# Patient Record
Sex: Female | Born: 1935 | Race: White | Hispanic: No | Smoking: Never smoker
Health system: Southern US, Community
[De-identification: ages and names within clinical notes are randomized; demographics above are authoritative.]

## PROBLEM LIST (undated history)

## (undated) DIAGNOSIS — G4700 Insomnia, unspecified: Secondary | ICD-10-CM

## (undated) DIAGNOSIS — I10 Essential (primary) hypertension: Secondary | ICD-10-CM

## (undated) DIAGNOSIS — M810 Age-related osteoporosis without current pathological fracture: Secondary | ICD-10-CM

## (undated) DIAGNOSIS — K219 Gastro-esophageal reflux disease without esophagitis: Secondary | ICD-10-CM

## (undated) DIAGNOSIS — R079 Chest pain, unspecified: Secondary | ICD-10-CM

## (undated) DIAGNOSIS — I6523 Occlusion and stenosis of bilateral carotid arteries: Secondary | ICD-10-CM

## (undated) HISTORY — PX: ABDOMINAL HYSTERECTOMY: SHX81

## (undated) HISTORY — DX: Essential (primary) hypertension: I10

## (undated) HISTORY — DX: Occlusion and stenosis of bilateral carotid arteries: I65.23

## (undated) HISTORY — DX: Gastro-esophageal reflux disease without esophagitis: K21.9

## (undated) HISTORY — DX: Insomnia, unspecified: G47.00

## (undated) HISTORY — DX: Chest pain, unspecified: R07.9

## (undated) HISTORY — DX: Age-related osteoporosis without current pathological fracture: M81.0

---

## 2004-07-11 ENCOUNTER — Ambulatory Visit: Payer: Self-pay | Admitting: Internal Medicine

## 2005-03-27 ENCOUNTER — Ambulatory Visit: Payer: Self-pay | Admitting: Internal Medicine

## 2005-10-09 ENCOUNTER — Ambulatory Visit (HOSPITAL_COMMUNITY): Admission: RE | Admit: 2005-10-09 | Discharge: 2005-10-09 | Payer: Self-pay | Admitting: *Deleted

## 2005-10-15 ENCOUNTER — Ambulatory Visit: Payer: Self-pay | Admitting: *Deleted

## 2006-04-17 ENCOUNTER — Ambulatory Visit: Payer: Self-pay | Admitting: Internal Medicine

## 2006-12-25 ENCOUNTER — Ambulatory Visit: Payer: Self-pay | Admitting: Gastroenterology

## 2008-05-16 ENCOUNTER — Ambulatory Visit: Payer: Self-pay | Admitting: Internal Medicine

## 2010-01-22 ENCOUNTER — Encounter
Admission: RE | Admit: 2010-01-22 | Discharge: 2010-01-22 | Payer: Self-pay | Source: Home / Self Care | Attending: Family Medicine | Admitting: Family Medicine

## 2010-02-14 ENCOUNTER — Ambulatory Visit: Payer: Self-pay | Admitting: Family Medicine

## 2011-06-25 ENCOUNTER — Other Ambulatory Visit: Payer: Self-pay | Admitting: Family Medicine

## 2011-06-25 DIAGNOSIS — R079 Chest pain, unspecified: Secondary | ICD-10-CM

## 2011-06-28 ENCOUNTER — Other Ambulatory Visit: Payer: Self-pay | Admitting: Family Medicine

## 2011-07-02 ENCOUNTER — Ambulatory Visit
Admission: RE | Admit: 2011-07-02 | Discharge: 2011-07-02 | Disposition: A | Discharge status: Home / Self Care | Payer: Medicare Other | Source: Ambulatory Visit | Attending: Family Medicine | Admitting: Family Medicine

## 2011-07-02 MED ORDER — IOHEXOL 300 MG/ML  SOLN
75.0000 mL | Freq: Once | INTRAMUSCULAR | Status: AC | PRN
  Administered 2011-07-02: 75 mL via INTRAVENOUS

## 2011-07-03 ENCOUNTER — Other Ambulatory Visit (HOSPITAL_COMMUNITY): Payer: Self-pay

## 2011-07-03 ENCOUNTER — Encounter (HOSPITAL_COMMUNITY): Payer: Self-pay

## 2011-07-23 ENCOUNTER — Other Ambulatory Visit: Payer: Self-pay | Admitting: Family Medicine

## 2011-07-23 DIAGNOSIS — R9389 Abnormal findings on diagnostic imaging of other specified body structures: Secondary | ICD-10-CM

## 2011-07-29 ENCOUNTER — Other Ambulatory Visit: Payer: Medicare Other

## 2011-08-14 ENCOUNTER — Ambulatory Visit
Admission: RE | Admit: 2011-08-14 | Discharge: 2011-08-14 | Disposition: A | Discharge status: Home / Self Care | Payer: Medicare Other | Source: Ambulatory Visit | Attending: Family Medicine | Admitting: Family Medicine

## 2011-08-14 DIAGNOSIS — R9389 Abnormal findings on diagnostic imaging of other specified body structures: Secondary | ICD-10-CM

## 2011-09-02 ENCOUNTER — Other Ambulatory Visit: Payer: Self-pay | Admitting: Family Medicine

## 2011-09-02 DIAGNOSIS — E041 Nontoxic single thyroid nodule: Secondary | ICD-10-CM

## 2011-09-10 ENCOUNTER — Other Ambulatory Visit (HOSPITAL_COMMUNITY)
Admission: RE | Admit: 2011-09-10 | Discharge: 2011-09-10 | Disposition: A | Discharge status: Home / Self Care | Payer: Medicare Other | Source: Ambulatory Visit | Attending: Interventional Radiology | Admitting: Interventional Radiology

## 2011-09-10 ENCOUNTER — Ambulatory Visit
Admission: RE | Admit: 2011-09-10 | Discharge: 2011-09-10 | Disposition: A | Discharge status: Home / Self Care | Payer: Medicare Other | Source: Ambulatory Visit | Attending: Family Medicine | Admitting: Family Medicine

## 2011-09-10 DIAGNOSIS — E049 Nontoxic goiter, unspecified: Secondary | ICD-10-CM | POA: Insufficient documentation

## 2011-09-10 DIAGNOSIS — E041 Nontoxic single thyroid nodule: Secondary | ICD-10-CM

## 2011-09-30 ENCOUNTER — Ambulatory Visit: Payer: Self-pay | Admitting: Family Medicine

## 2012-04-16 ENCOUNTER — Encounter: Payer: Self-pay | Admitting: *Deleted

## 2012-04-16 DIAGNOSIS — R0989 Other specified symptoms and signs involving the circulatory and respiratory systems: Secondary | ICD-10-CM | POA: Insufficient documentation

## 2012-04-16 DIAGNOSIS — K219 Gastro-esophageal reflux disease without esophagitis: Secondary | ICD-10-CM | POA: Insufficient documentation

## 2012-04-16 DIAGNOSIS — I10 Essential (primary) hypertension: Secondary | ICD-10-CM | POA: Insufficient documentation

## 2012-04-21 ENCOUNTER — Ambulatory Visit (INDEPENDENT_AMBULATORY_CARE_PROVIDER_SITE_OTHER): Payer: BC Managed Care – PPO | Admitting: Family Medicine

## 2012-04-21 ENCOUNTER — Encounter: Payer: Self-pay | Admitting: Family Medicine

## 2012-04-21 VITALS — BP 190/86 | HR 60 | Temp 98.0°F | Resp 16 | Ht 63.0 in | Wt 127.0 lb

## 2012-04-21 DIAGNOSIS — I10 Essential (primary) hypertension: Secondary | ICD-10-CM

## 2012-04-21 DIAGNOSIS — R0609 Other forms of dyspnea: Secondary | ICD-10-CM

## 2012-04-21 DIAGNOSIS — R0989 Other specified symptoms and signs involving the circulatory and respiratory systems: Secondary | ICD-10-CM

## 2012-04-21 NOTE — Patient Instructions (Addendum)
Quit diltiazem. Increase clonidine to 0.1 mg three times a day.  I am going to schedule a cardiology consult. Please bring me blood pressure readings in 1 month.
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
## 2012-04-27 ENCOUNTER — Encounter: Payer: Self-pay | Admitting: Family Medicine

## 2012-04-27 ENCOUNTER — Ambulatory Visit (INDEPENDENT_AMBULATORY_CARE_PROVIDER_SITE_OTHER): Payer: BC Managed Care – PPO | Admitting: Family Medicine

## 2012-04-27 VITALS — BP 220/90 | HR 80 | Temp 98.1°F | Resp 14

## 2012-04-27 DIAGNOSIS — I10 Essential (primary) hypertension: Secondary | ICD-10-CM
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
## 2012-04-29 ENCOUNTER — Ambulatory Visit (INDEPENDENT_AMBULATORY_CARE_PROVIDER_SITE_OTHER): Payer: BC Managed Care – PPO | Admitting: Family Medicine

## 2012-04-29 ENCOUNTER — Encounter: Payer: Self-pay | Admitting: Family Medicine

## 2012-04-29 VITALS — BP 170/70 | HR 62 | Temp 97.7°F | Resp 14 | Wt 129.0 lb

## 2012-04-29 DIAGNOSIS — I10 Essential (primary) hypertension: Secondary | ICD-10-CM

## 2012-04-29 MED ORDER — SPIRONOLACTONE 25 MG PO TABS: 25.0000 mg | ORAL_TABLET | Freq: Every day | ORAL | Status: DC

## 2012-04-29 MED ORDER — CLONIDINE HCL 0.3 MG/24HR TD PTWK: 1.0000 | MEDICATED_PATCH | TRANSDERMAL | Status: DC
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
## 2012-04-30 ENCOUNTER — Telehealth: Payer: Self-pay | Admitting: Family Medicine

## 2012-04-30 NOTE — Telephone Encounter (Signed)
Two medications refilled yesterday sent to wrong pharmacy. Clonidine Patch 0.3mg /24hr qwk #4 2 refills Aldactone 25mg  one po qd  #30 2 refills Pt request they be sent ot [facility] on [address] in [place].  (pharmacy called, verbl order given) Pharmacy preference changed.

## 2012-05-03 LAB — ALDOSTERONE + RENIN ACTIVITY W/ RATIO: Aldosterone: 2 ng/dL

## 2012-05-07 ENCOUNTER — Telehealth: Payer: Self-pay | Admitting: Family Medicine

## 2012-05-07 ENCOUNTER — Ambulatory Visit (INDEPENDENT_AMBULATORY_CARE_PROVIDER_SITE_OTHER): Payer: BC Managed Care – PPO | Admitting: Family Medicine

## 2012-05-07 VITALS — BP 142/80

## 2012-05-07 DIAGNOSIS — I10 Essential (primary) hypertension: Secondary | ICD-10-CM

## 2012-05-07 MED ORDER — CLONAZEPAM 0.5 MG PO TABS: 0.5000 mg | ORAL_TABLET | Freq: Three times a day (TID) | ORAL | Status: DC | PRN

## 2012-05-07 NOTE — Patient Instructions (Signed)
Bp checked by nurse.  Pt concerned about BP because has cataract surgery pending for next month.  Appt made to F/U  With Dr Tanya Nones on 4/17.  Told can stop by office for BP check at any time.

## 2012-05-07 NOTE — Telephone Encounter (Signed)
Ok to refill 60.  How are her BP's?

## 2012-05-07 NOTE — Telephone Encounter (Signed)
?  ok to reill

## 2012-05-07 NOTE — Telephone Encounter (Signed)
Med c/o BP's in chart are much improved

## 2012-05-08 ENCOUNTER — Encounter: Payer: Self-pay | Admitting: *Deleted

## 2012-05-12 ENCOUNTER — Ambulatory Visit (INDEPENDENT_AMBULATORY_CARE_PROVIDER_SITE_OTHER): Payer: Medicare Other | Admitting: Cardiovascular Disease

## 2012-05-12 ENCOUNTER — Encounter: Payer: Self-pay | Admitting: Cardiovascular Disease

## 2012-05-12 VITALS — BP 172/70 | HR 63 | Ht 63.0 in | Wt 124.1 lb

## 2012-05-12 DIAGNOSIS — I10 Essential (primary) hypertension: Secondary | ICD-10-CM

## 2012-05-12 DIAGNOSIS — R0600 Dyspnea, unspecified: Secondary | ICD-10-CM | POA: Insufficient documentation

## 2012-05-12 DIAGNOSIS — R0989 Other specified symptoms and signs involving the circulatory and respiratory systems: Secondary | ICD-10-CM

## 2012-05-12 DIAGNOSIS — R0609 Other forms of dyspnea: Secondary | ICD-10-CM
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
## 2012-05-12 NOTE — Assessment & Plan Note (Signed)
Labile not clear clonidine will work for her  F/U Dr Tanya Nones Low sodium diet

## 2012-05-12 NOTE — Assessment & Plan Note (Signed)
Exertonal dyspnea with normal cardiovascular exam  F/U stress echo

## 2012-05-12 NOTE — Patient Instructions (Signed)
Your physician recommends that you schedule a follow-up appointment in:   AS NEEDED   Your physician recommends that you continue on your current medications as directed. Please refer to the Current Medication list given to you today.   Your physician has requested that you have a stress echocardiogram. For further information please visit [email or web address]. Please follow instruction sheet as given.  

## 2012-05-21 ENCOUNTER — Encounter: Payer: Self-pay | Admitting: Family Medicine

## 2012-05-21 ENCOUNTER — Ambulatory Visit (INDEPENDENT_AMBULATORY_CARE_PROVIDER_SITE_OTHER): Payer: BC Managed Care – PPO | Admitting: Family Medicine

## 2012-05-21 VITALS — BP 200/90 | HR 74 | Temp 98.0°F | Resp 16 | Wt 128.0 lb

## 2012-05-21 DIAGNOSIS — IMO0001 Reserved for inherently not codable concepts without codable children: Secondary | ICD-10-CM

## 2012-05-21 DIAGNOSIS — I10 Essential (primary) hypertension: Secondary | ICD-10-CM
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
## 2012-05-27 ENCOUNTER — Ambulatory Visit (HOSPITAL_COMMUNITY): Payer: Medicare Other | Attending: Cardiovascular Disease

## 2012-05-27 ENCOUNTER — Encounter: Payer: Self-pay | Admitting: Family Medicine

## 2012-05-27 ENCOUNTER — Encounter: Payer: Self-pay | Admitting: Cardiovascular Disease

## 2012-05-27 ENCOUNTER — Ambulatory Visit (HOSPITAL_COMMUNITY): Payer: Medicare Other | Attending: Cardiovascular Disease | Admitting: Radiology

## 2012-05-27 DIAGNOSIS — R0609 Other forms of dyspnea: Secondary | ICD-10-CM | POA: Insufficient documentation

## 2012-05-27 DIAGNOSIS — R0989 Other specified symptoms and signs involving the circulatory and respiratory systems: Secondary | ICD-10-CM | POA: Insufficient documentation

## 2012-05-27 DIAGNOSIS — R072 Precordial pain: Secondary | ICD-10-CM

## 2012-05-27 DIAGNOSIS — R0600 Dyspnea, unspecified: Secondary | ICD-10-CM

## 2012-05-27 DIAGNOSIS — I10 Essential (primary) hypertension: Secondary | ICD-10-CM | POA: Insufficient documentation

## 2012-05-27 DIAGNOSIS — R079 Chest pain, unspecified: Secondary | ICD-10-CM | POA: Insufficient documentation

## 2012-05-27 NOTE — Progress Notes (Signed)
Stress Echocardiogram performed.  

## 2012-05-28 ENCOUNTER — Encounter: Payer: Self-pay | Admitting: Family Medicine

## 2012-06-12 ENCOUNTER — Encounter: Payer: Self-pay | Admitting: Family Medicine

## 2012-06-16 ENCOUNTER — Encounter: Payer: Self-pay | Admitting: Family Medicine

## 2012-06-16 ENCOUNTER — Ambulatory Visit (INDEPENDENT_AMBULATORY_CARE_PROVIDER_SITE_OTHER): Payer: BC Managed Care – PPO | Admitting: Family Medicine

## 2012-06-16 VITALS — BP 160/70 | HR 58 | Temp 98.2°F | Resp 16 | Wt 127.0 lb

## 2012-06-16 DIAGNOSIS — R001 Bradycardia, unspecified: Secondary | ICD-10-CM

## 2012-06-16 DIAGNOSIS — IMO0001 Reserved for inherently not codable concepts without codable children: Secondary | ICD-10-CM

## 2012-06-16 DIAGNOSIS — I10 Essential (primary) hypertension: Secondary | ICD-10-CM

## 2012-06-16 DIAGNOSIS — I498 Other specified cardiac arrhythmias: Secondary | ICD-10-CM

## 2012-06-16 NOTE — Progress Notes (Signed)
Subjective:    Patient ID: Brittany Levy, female    DOB: [birth year], 78 y.o.   MRN: [id number]  HPI Patient is here for followup of her hypertension. She is currently on a clonidine patch 0.3 mg per 24 hours, Cardizem CD 180 mg by mouth daily, hydralazine 25 mg by mouth twice a day, hydrochlorothiazide 25 mg by mouth daily, Avapro 300 mg by mouth daily, bystolic 10 mg by mouth daily. She was taking Spironolactone 25 mg by mouth daily, however I discontinued this when she was experiencing hypotension with blood pressures of 80-100 over 40s. Her blood pressure is better at home today is 111/47. She has a history of significant white coat hypertension. For instance, her blood pressure in the office today is 160/70. This is erroneous. Her blood pressure at home is much better.  However she continues to have bradycardia with pulses of 40s to 50s.  Her stress echocardiogram was essentially normal although she was not able to reach a target heart rate due to her bradycardia. Her bradycardia is caused in part due to bystolic as well as diltiazem.   Past Medical History  Diagnosis Date  . Hypertension   . GERD (gastroesophageal reflux disease)   . Chest pain    Current Outpatient Prescriptions on File Prior to Visit  Medication Sig Dispense Refill  . calcium-vitamin D (OSCAL WITH D) 250-125 MG-UNIT per tablet Take 1 tablet by mouth 2 (two) times daily.      . clonazePAM (KLONOPIN) 0.5 MG tablet Take 1 tablet (0.5 mg total) by mouth every 8 (eight) hours as needed for anxiety.  60 tablet  0  . cloNIDine (CATAPRES - DOSED IN MG/24 HR) 0.3 mg/24hr Place 1 patch (0.3 mg total) onto the skin once a week.  4 patch  2  . diltiazem (CARDIZEM CD) 180 MG 24 hr capsule Take 180 mg by mouth at bedtime.      . hydrALAZINE (APRESOLINE) 25 MG tablet Take 25 mg by mouth 2 (two) times daily.      . hydrochlorothiazide (HYDRODIURIL) 25 MG tablet Take 50 mg by mouth daily.      . irbesartan (AVAPRO) 300 MG tablet Take  300 mg by mouth daily.       . nebivolol (BYSTOLIC) 10 MG tablet Take 10 mg by mouth daily.      Marland Kitchen omeprazole (PRILOSEC) 40 MG capsule Take 40 mg by mouth daily.      Marland Kitchen KLOR-CON 10 10 MEQ tablet       . spironolactone (ALDACTONE) 25 MG tablet Take 1 tablet (25 mg total) by mouth daily.  30 tablet  2   No current facility-administered medications on file prior to visit.   No Known Allergies History   Social History  . Marital Status: Married    Spouse Name: N/A    Number of Children: N/A  . Years of Education: N/A   Occupational History  . Not on file.   Social History Main Topics  . Smoking status: Never Smoker   . Smokeless tobacco: Not on file  . Alcohol Use: Yes     Comment: wine occasionally  . Drug Use: No  . Sexually Active: Not on file   Other Topics Concern  . Not on file   Social History Narrative  . No narrative on file      Review of Systems  All other systems reviewed and are negative.       Objective:   Physical Exam  Cardiovascular: Normal rate, regular rhythm and normal heart sounds.  Exam reveals no gallop.   No murmur heard. Pulmonary/Chest: Effort normal and breath sounds normal. No respiratory distress. She has no wheezes. She has no rales.  Abdominal: Soft. Bowel sounds are normal. She exhibits no distension. There is no tenderness. There is no rebound.          Assessment & Plan:  1. White coat hypertension Office blood pressures should always be viewed as suspect.  Her heart pressure is well controlled 100-111 over 40s to 50s.    2. HTN (hypertension) Due to hypotension, discontinue spironolactone altogether and discontinue diltiazem.  Monitor Home blood pressures. Goal blood pressure is less than 140/90.  Because she is stopping spironolactone and, I have asked the patient to resume her Klor-Con 10 mEq by mouth daily.  3. Bradycardia Discontinue diltiazem.

## 2012-07-12 ENCOUNTER — Encounter: Payer: Self-pay | Admitting: Family Medicine

## 2012-07-13 ENCOUNTER — Other Ambulatory Visit: Payer: Self-pay | Admitting: Family Medicine

## 2012-07-13 MED ORDER — CLONIDINE HCL 0.3 MG/24HR TD PTWK: 1.0000 | MEDICATED_PATCH | TRANSDERMAL | Status: DC

## 2012-07-13 NOTE — Telephone Encounter (Signed)
Rx Refilled  

## 2012-08-19 ENCOUNTER — Encounter: Payer: Self-pay | Admitting: Family Medicine

## 2012-08-20 ENCOUNTER — Other Ambulatory Visit: Payer: Self-pay | Admitting: Family Medicine

## 2012-08-20 MED ORDER — CLONAZEPAM 0.5 MG PO TABS: 0.5000 mg | ORAL_TABLET | Freq: Three times a day (TID) | ORAL | Status: DC | PRN

## 2012-08-20 NOTE — Telephone Encounter (Signed)
Rx Refilled per Dr. Tanya Nones

## 2012-09-09 ENCOUNTER — Other Ambulatory Visit: Payer: Self-pay

## 2012-10-08 ENCOUNTER — Ambulatory Visit (INDEPENDENT_AMBULATORY_CARE_PROVIDER_SITE_OTHER): Payer: BC Managed Care – PPO | Admitting: Family Medicine

## 2012-10-08 ENCOUNTER — Encounter: Payer: Self-pay | Admitting: Family Medicine

## 2012-10-08 VITALS — BP 171/80 | HR 74 | Temp 98.2°F | Resp 16 | Wt 122.0 lb

## 2012-10-08 DIAGNOSIS — J329 Chronic sinusitis, unspecified: Secondary | ICD-10-CM

## 2012-10-08 DIAGNOSIS — I10 Essential (primary) hypertension: Secondary | ICD-10-CM

## 2012-10-08 DIAGNOSIS — Z23 Encounter for immunization: Secondary | ICD-10-CM

## 2012-10-08 MED ORDER — FELODIPINE ER 5 MG PO TB24: 5.0000 mg | ORAL_TABLET | Freq: Every day | ORAL | Status: DC

## 2012-10-08 MED ORDER — FLUTICASONE PROPIONATE 50 MCG/ACT NA SUSP: 2.0000 | Freq: Every day | NASAL | Status: DC

## 2012-10-08 NOTE — Progress Notes (Signed)
Subjective:    Patient ID: Brittany Levy, female    DOB: [birth year], 78 y.o.   MRN: [id number]  HPI Reports 3 months of constant sinus drainage, postnasal drip, sneezing, rhinorrhea. She denies any sinus pain. She denies any headaches. She denies any fevers.  She also reports that her left ear feels like it is stopped up.  She denies any ear pain. She denies any sore throat, nausea, vomiting.  Blood pressures have been ranging 150-170 over 80s at home. Past Medical History  Diagnosis Date  . Hypertension   . GERD (gastroesophageal reflux disease)   . Chest pain    Past Surgical History  Procedure Laterality Date  . Abdominal hysterectomy     Current Outpatient Prescriptions on File Prior to Visit  Medication Sig Dispense Refill  . calcium-vitamin D (OSCAL WITH D) 250-125 MG-UNIT per tablet Take 1 tablet by mouth 2 (two) times daily.      . clonazePAM (KLONOPIN) 0.5 MG tablet Take 1 tablet (0.5 mg total) by mouth every 8 (eight) hours as needed for anxiety.  60 tablet  1  . cloNIDine (CATAPRES - DOSED IN MG/24 HR) 0.3 mg/24hr Place 1 patch (0.3 mg total) onto the skin once a week.  4 patch  5  . hydrALAZINE (APRESOLINE) 25 MG tablet Take 25 mg by mouth 2 (two) times daily.      . hydrochlorothiazide (HYDRODIURIL) 25 MG tablet Take 50 mg by mouth daily.      . irbesartan (AVAPRO) 300 MG tablet Take 300 mg by mouth daily.       Marland Kitchen KLOR-CON 10 10 MEQ tablet Take 10 mEq by mouth daily.       . nebivolol (BYSTOLIC) 10 MG tablet Take 10 mg by mouth daily.      Marland Kitchen omeprazole (PRILOSEC) 40 MG capsule Take 40 mg by mouth daily.       No current facility-administered medications on file prior to visit.   No Known Allergies History   Social History  . Marital Status: Married    Spouse Name: N/A    Number of Children: N/A  . Years of Education: N/A   Occupational History  . Not on file.   Social History Main Topics  . Smoking status: Never Smoker   . Smokeless tobacco: Not on file   . Alcohol Use: Yes     Comment: wine occasionally  . Drug Use: No  . Sexual Activity: Not on file   Other Topics Concern  . Not on file   Social History Narrative  . No narrative on file      Review of Systems  All other systems reviewed and are negative.       Objective:   Physical Exam  Vitals reviewed. Constitutional: She appears well-developed and well-nourished.  HENT:  Right Ear: External ear normal.  Left Ear: External ear normal.  Nose: Mucosal edema and rhinorrhea present.  Mouth/Throat: Oropharynx is clear and moist. No oropharyngeal exudate.  Eyes: Conjunctivae and EOM are normal. Pupils are equal, round, and reactive to light. Right eye exhibits no discharge. Left eye exhibits no discharge. No scleral icterus.  Neck: Normal range of motion. Neck supple.  Cardiovascular: Normal rate, regular rhythm and normal heart sounds.  Exam reveals no gallop and no friction rub.   No murmur heard. Pulmonary/Chest: Effort normal and breath sounds normal. No respiratory distress. She has no wheezes. She has no rales. She exhibits no tenderness.  Lymphadenopathy:    She has  no cervical adenopathy.          Assessment & Plan:  1. Sinusitis, chronic I do not see any evidence of a sinus infection this most likely allergies. Begin Flonase 2 sprays in each nostril daily and recheck in 2 weeks. I believe her ear problems reflect eustachian tube dysfunction from her chronic sinusitis. There is no evidence of cerumen impaction in her ear or effusion - fluticasone (FLONASE) 50 MCG/ACT nasal spray; Place 2 sprays into the nose daily.  Dispense: 16 g; Refill: 6  2. HTN (hypertension) Add plendil 5 mg by mouth daily and recheck blood pressure in 2 weeks - felodipine (PLENDIL) 5 MG 24 hr tablet; Take 1 tablet (5 mg total) by mouth daily.  Dispense: 30 tablet; Refill: 5  3. Need for prophylactic vaccination and inoculation against unspecified single disease  - Flu Vaccine QUAD  36+ mos IM

## 2013-01-04 ENCOUNTER — Encounter: Payer: Self-pay | Admitting: Family Medicine

## 2013-01-11 ENCOUNTER — Other Ambulatory Visit: Payer: Self-pay | Admitting: Family Medicine

## 2013-02-12 ENCOUNTER — Encounter: Payer: Self-pay | Admitting: Family Medicine

## 2013-02-12 ENCOUNTER — Ambulatory Visit (INDEPENDENT_AMBULATORY_CARE_PROVIDER_SITE_OTHER): Payer: BC Managed Care – PPO | Admitting: Family Medicine

## 2013-02-12 ENCOUNTER — Other Ambulatory Visit: Payer: Self-pay | Admitting: Family Medicine

## 2013-02-12 VITALS — BP 150/58 | HR 94 | Temp 97.2°F | Resp 16 | Wt 128.0 lb

## 2013-02-12 DIAGNOSIS — Z23 Encounter for immunization: Secondary | ICD-10-CM

## 2013-02-12 DIAGNOSIS — J329 Chronic sinusitis, unspecified: Secondary | ICD-10-CM

## 2013-02-12 DIAGNOSIS — I10 Essential (primary) hypertension: Secondary | ICD-10-CM

## 2013-02-12 MED ORDER — IRBESARTAN 300 MG PO TABS: 300.0000 mg | ORAL_TABLET | Freq: Every day | ORAL | Status: DC

## 2013-02-12 MED ORDER — HYDROCHLOROTHIAZIDE 25 MG PO TABS: 50.0000 mg | ORAL_TABLET | Freq: Every day | ORAL | Status: DC

## 2013-02-12 MED ORDER — FELODIPINE ER 5 MG PO TB24: 5.0000 mg | ORAL_TABLET | Freq: Every day | ORAL | Status: DC

## 2013-02-12 MED ORDER — HYDRALAZINE HCL 25 MG PO TABS: 25.0000 mg | ORAL_TABLET | Freq: Two times a day (BID) | ORAL | Status: DC

## 2013-02-12 MED ORDER — NEBIVOLOL HCL 10 MG PO TABS: 10.0000 mg | ORAL_TABLET | Freq: Every day | ORAL | Status: DC

## 2013-02-12 MED ORDER — CLONIDINE HCL 0.3 MG/24HR TD PTWK: 0.3000 mg | MEDICATED_PATCH | TRANSDERMAL | Status: DC

## 2013-02-12 MED ORDER — POTASSIUM CHLORIDE ER 10 MEQ PO TBCR: 10.0000 meq | EXTENDED_RELEASE_TABLET | Freq: Every day | ORAL | Status: DC

## 2013-02-12 MED ORDER — HYDROCHLOROTHIAZIDE 25 MG PO TABS
50.0000 mg | ORAL_TABLET | Freq: Every day | ORAL | Status: DC
Start: 2013-02-12 — End: 2013-02-12

## 2013-02-12 MED ORDER — OMEPRAZOLE 40 MG PO CPDR: 40.0000 mg | DELAYED_RELEASE_CAPSULE | Freq: Every day | ORAL | Status: DC

## 2013-02-12 NOTE — Progress Notes (Signed)
Subjective:    Patient ID: Brittany Levy, female    DOB: [birth year], 79 y.o.   MRN: [id number]  HPI  Patient presents today for recheck of her blood pressure. She is very difficult to control hypertension. She has had negative workup for secondary causes of hypertension. She is currently on Catapres, felodipine, hydralazine, hydrochlorothiazide, Avapro, bystolic.  Fortunately her blood pressures range 108-159 home but the vast majority being less than 130. Her heart rate has been ranging 54-82.  She denies any chest pain, shortness of breath, dyspnea on exertion. She denies any cramps. She denies any side effects of medication. She is due for a fasting lipid panel, CBC, and CMP. She is also due for Prevnar 13. The patient is not fasting today but she would like to return next week fasting for lab work. Past Medical History  Diagnosis Date  . Hypertension   . GERD (gastroesophageal reflux disease)   . Chest pain    Current Outpatient Prescriptions on File Prior to Visit  Medication Sig Dispense Refill  . calcium-vitamin D (OSCAL WITH D) 250-125 MG-UNIT per tablet Take 1 tablet by mouth 2 (two) times daily.      . clonazePAM (KLONOPIN) 0.5 MG tablet Take 1 tablet (0.5 mg total) by mouth every 8 (eight) hours as needed for anxiety.  60 tablet  1  . fluticasone (FLONASE) 50 MCG/ACT nasal spray Place 2 sprays into the nose daily.  16 g  6   No current facility-administered medications on file prior to visit.   No Known Allergies History   Social History  . Marital Status: Married    Spouse Name: N/A    Number of Children: N/A  . Years of Education: N/A   Occupational History  . Not on file.   Social History Main Topics  . Smoking status: Never Smoker   . Smokeless tobacco: Not on file  . Alcohol Use: Yes     Comment: wine occasionally  . Drug Use: No  . Sexual Activity: Not on file   Other Topics Concern  . Not on file   Social History Narrative  . No narrative on file      Review of Systems  All other systems reviewed and are negative.       Objective:   Physical Exam  Vitals reviewed. Constitutional: She is oriented to person, place, and time.  Neck: Neck supple. No JVD present. No thyromegaly present.  Cardiovascular: Normal rate, regular rhythm and intact distal pulses.   Murmur heard. Pulmonary/Chest: Effort normal and breath sounds normal. No respiratory distress. She has no wheezes. She has no rales.  Abdominal: Soft. Bowel sounds are normal. She exhibits no distension. There is no tenderness. There is no rebound and no guarding.  Musculoskeletal: She exhibits no edema.  Lymphadenopathy:    She has no cervical adenopathy.  Neurological: She is alert and oriented to person, place, and time. She has normal reflexes. She displays normal reflexes. No cranial nerve deficit. She exhibits normal muscle tone. Coordination normal.  Skin: Skin is warm. No rash noted. No erythema. No pallor.   1/6 systolic ejection murmur heard best over the aortic valve.        Assessment & Plan:  1. HTN (hypertension) Blood pressures currently well controlled. Refill on patient's medication for a three-month supply with 3 refills. Did ask the patient to return fasting so that could check a CBC, CMP, fasting lipid panel. I believe the patient is developing some aortic  sclerosis causing her murmur. If the murmur gets louder or if she develops any symptoms I would proceed with an echocardiogram - felodipine (PLENDIL) 5 MG 24 hr tablet; Take 1 tablet (5 mg total) by mouth daily.  Dispense: 90 tablet; Refill: 3  2. Sinusitis, chronic

## 2013-02-12 NOTE — Addendum Note (Signed)
Addended by: Legrand RamsWILLIS, Lorry Furber B on: 02/12/2013 12:48 PM   Modules accepted: Orders

## 2013-02-15 ENCOUNTER — Other Ambulatory Visit: Payer: BC Managed Care – PPO

## 2013-02-15 DIAGNOSIS — I10 Essential (primary) hypertension: Secondary | ICD-10-CM

## 2013-02-15 LAB — CBC WITH DIFFERENTIAL/PLATELET
Basophils Absolute: 0 10*3/uL (ref 0.0–0.1)
Basophils Relative: 1 % (ref 0–1)
EOS ABS: 0.1 10*3/uL (ref 0.0–0.7)
EOS PCT: 2 % (ref 0–5)
HCT: 37.9 % (ref 36.0–46.0)
HEMOGLOBIN: 12.8 g/dL (ref 12.0–15.0)
LYMPHS ABS: 2.6 10*3/uL (ref 0.7–4.0)
Lymphocytes Relative: 41 % (ref 12–46)
MCH: 29.8 pg (ref 26.0–34.0)
MCHC: 33.8 g/dL (ref 30.0–36.0)
MCV: 88.1 fL (ref 78.0–100.0)
MONOS PCT: 11 % (ref 3–12)
Monocytes Absolute: 0.7 10*3/uL (ref 0.1–1.0)
Neutro Abs: 2.9 10*3/uL (ref 1.7–7.7)
Neutrophils Relative %: 45 % (ref 43–77)
Platelets: 205 10*3/uL (ref 150–400)
RBC: 4.3 MIL/uL (ref 3.87–5.11)
RDW: 13.5 % (ref 11.5–15.5)
WBC: 6.4 10*3/uL (ref 4.0–10.5)

## 2013-02-15 LAB — COMPLETE METABOLIC PANEL WITH GFR
ALBUMIN: 4.2 g/dL (ref 3.5–5.2)
ALT: 12 U/L (ref 0–35)
AST: 13 U/L (ref 0–37)
Alkaline Phosphatase: 57 U/L (ref 39–117)
BUN: 14 mg/dL (ref 6–23)
CALCIUM: 10 mg/dL (ref 8.4–10.5)
CO2: 32 mEq/L (ref 19–32)
CREATININE: 0.82 mg/dL (ref 0.50–1.10)
Chloride: 98 mEq/L (ref 96–112)
GFR, EST NON AFRICAN AMERICAN: 70 mL/min
GFR, Est African American: 80 mL/min
GLUCOSE: 102 mg/dL — AB (ref 70–99)
POTASSIUM: 4.3 meq/L (ref 3.5–5.3)
Sodium: 139 mEq/L (ref 135–145)
Total Bilirubin: 0.6 mg/dL (ref 0.3–1.2)
Total Protein: 6.9 g/dL (ref 6.0–8.3)

## 2013-02-15 LAB — LIPID PANEL
CHOLESTEROL: 229 mg/dL — AB (ref 0–200)
HDL: 59 mg/dL (ref >39)
LDL Cholesterol: 145 mg/dL — ABNORMAL HIGH (ref 0–99)
TRIGLYCERIDES: 127 mg/dL (ref <150)
Total CHOL/HDL Ratio: 3.9 Ratio
VLDL: 25 mg/dL (ref 0–40)

## 2013-07-08 ENCOUNTER — Encounter: Payer: Self-pay | Admitting: Family Medicine

## 2013-07-08 ENCOUNTER — Ambulatory Visit (INDEPENDENT_AMBULATORY_CARE_PROVIDER_SITE_OTHER): Payer: BC Managed Care – PPO | Admitting: Family Medicine

## 2013-07-08 VITALS — BP 160/68 | HR 80 | Temp 96.8°F | Resp 14 | Ht 63.0 in | Wt 127.0 lb

## 2013-07-08 DIAGNOSIS — I10 Essential (primary) hypertension: Secondary | ICD-10-CM

## 2013-07-08 DIAGNOSIS — G4700 Insomnia, unspecified: Secondary | ICD-10-CM

## 2013-07-08 LAB — CBC WITH DIFFERENTIAL/PLATELET
BASOS ABS: 0.1 10*3/uL (ref 0.0–0.1)
Basophils Relative: 1 % (ref 0–1)
Eosinophils Absolute: 0.1 10*3/uL (ref 0.0–0.7)
Eosinophils Relative: 2 % (ref 0–5)
HCT: 36.9 % (ref 36.0–46.0)
Hemoglobin: 12.7 g/dL (ref 12.0–15.0)
LYMPHS ABS: 3.1 10*3/uL (ref 0.7–4.0)
LYMPHS PCT: 44 % (ref 12–46)
MCH: 29.5 pg (ref 26.0–34.0)
MCHC: 34.4 g/dL (ref 30.0–36.0)
MCV: 85.6 fL (ref 78.0–100.0)
MONO ABS: 0.6 10*3/uL (ref 0.1–1.0)
Monocytes Relative: 9 % (ref 3–12)
NEUTROS ABS: 3.1 10*3/uL (ref 1.7–7.7)
Neutrophils Relative %: 44 % (ref 43–77)
Platelets: 205 10*3/uL (ref 150–400)
RBC: 4.31 MIL/uL (ref 3.87–5.11)
RDW: 13.5 % (ref 11.5–15.5)
WBC: 7 10*3/uL (ref 4.0–10.5)

## 2013-07-08 LAB — COMPLETE METABOLIC PANEL WITH GFR
ALBUMIN: 4.3 g/dL (ref 3.5–5.2)
ALK PHOS: 62 U/L (ref 39–117)
ALT: 15 U/L (ref 0–35)
AST: 14 U/L (ref 0–37)
BUN: 12 mg/dL (ref 6–23)
CO2: 31 mEq/L (ref 19–32)
CREATININE: 0.86 mg/dL (ref 0.50–1.10)
Calcium: 10.2 mg/dL (ref 8.4–10.5)
Chloride: 97 mEq/L (ref 96–112)
GFR, EST NON AFRICAN AMERICAN: 65 mL/min
GFR, Est African American: 75 mL/min
GLUCOSE: 94 mg/dL (ref 70–99)
POTASSIUM: 3.7 meq/L (ref 3.5–5.3)
Sodium: 135 mEq/L (ref 135–145)
Total Bilirubin: 0.6 mg/dL (ref 0.2–1.2)
Total Protein: 7.2 g/dL (ref 6.0–8.3)

## 2013-07-08 MED ORDER — ZOLPIDEM TARTRATE 10 MG PO TABS: 10.0000 mg | ORAL_TABLET | Freq: Every evening | ORAL | Status: DC | PRN

## 2013-07-08 NOTE — Progress Notes (Signed)
Subjective:    Patient ID: Brittany Levy, female    DOB: 06/02/1934, 79 y.o.   MRN: [id number]  HPI  02/12/13 Patient presents today for recheck of her blood pressure. She is very difficult to control hypertension. She has had negative workup for secondary causes of hypertension. She is currently on Catapres, felodipine, hydralazine, hydrochlorothiazide, Avapro, bystolic.  Fortunately her blood pressures range 108-159 home but the vast majority being less than 130. Her heart rate has been ranging 54-82.  She denies any chest pain, shortness of breath, dyspnea on exertion. She denies any cramps. She denies any side effects of medication. She is due for a fasting lipid panel, CBC, and CMP. She is also due for Prevnar 13. The patient is not fasting today but she would like to return next week fasting for lab work.  At that time, my plan was: 1. HTN (hypertension) Blood pressures currently well controlled. Refilled patient's medication for a three-month supply with 3 refills. Did ask the patient to return fasting so that could check a CBC, CMP, fasting lipid panel. I believe the patient is developing some aortic sclerosis causing her murmur. If the murmur gets louder or if she develops any symptoms I would proceed with an echocardiogram - felodipine (PLENDIL) 5 MG 24 hr tablet; Take 1 tablet (5 mg total) by mouth daily.  Dispense: 90 tablet; Refill: 3  07/08/13 She is here today for follow up.  She reports several months of insomnia. She states that she goes to bed around 11 clock at night. She would wake up after only one to one and a half hours. She has been unable to go back to sleep. She denies any anxiety or depression. She is not perseverating over daily events while lying in bed.  She does watch TV at night in bed while she cannot sleep. She is not drinking excessive caffeine.  She has a history of whitecoat syndrome. Patient blood pressures high here. Typically her blood pressure is much better at  home. Apparently she has not been checking her blood pressures lately because she cannot sleep and she is worried it would be high. Past Medical History  Diagnosis Date  . Hypertension   . GERD (gastroesophageal reflux disease)   . Chest pain    Current Outpatient Prescriptions on File Prior to Visit  Medication Sig Dispense Refill  . calcium-vitamin D (OSCAL WITH D) 250-125 MG-UNIT per tablet Take 1 tablet by mouth 2 (two) times daily.      . clonazePAM (KLONOPIN) 0.5 MG tablet Take 1 tablet (0.5 mg total) by mouth every 8 (eight) hours as needed for anxiety.  60 tablet  1  . cloNIDine (CATAPRES - DOSED IN MG/24 HR) 0.3 mg/24hr patch Place 1 patch (0.3 mg total) onto the skin once a week.  16 patch  4  . felodipine (PLENDIL) 5 MG 24 hr tablet Take 1 tablet (5 mg total) by mouth daily.  90 tablet  4  . fluticasone (FLONASE) 50 MCG/ACT nasal spray Place 2 sprays into the nose daily.  16 g  6  . hydrALAZINE (APRESOLINE) 25 MG tablet Take 1 tablet (25 mg total) by mouth 2 (two) times daily.  180 tablet  3  . hydrochlorothiazide (HYDRODIURIL) 25 MG tablet Take 2 tablets (50 mg total) by mouth daily.  180 tablet  3  . irbesartan (AVAPRO) 300 MG tablet Take 1 tablet (300 mg total) by mouth daily.  90 tablet  3  . nebivolol (BYSTOLIC)  10 MG tablet Take 1 tablet (10 mg total) by mouth daily.  90 tablet  3  . omeprazole (PRILOSEC) 40 MG capsule Take 1 capsule (40 mg total) by mouth daily.  90 capsule  3  . potassium chloride (KLOR-CON 10) 10 MEQ tablet Take 1 tablet (10 mEq total) by mouth daily.  90 tablet  3   No current facility-administered medications on file prior to visit.   No Known Allergies History   Social History  . Marital Status: Married    Spouse Name: N/A    Number of Children: N/A  . Years of Education: N/A   Occupational History  . Not on file.   Social History Main Topics  . Smoking status: Never Smoker   . Smokeless tobacco: Not on file  . Alcohol Use: Yes      Comment: wine occasionally  . Drug Use: No  . Sexual Activity: Not on file   Other Topics Concern  . Not on file   Social History Narrative  . No narrative on file     Review of Systems  All other systems reviewed and are negative.      Objective:   Physical Exam  Vitals reviewed. Constitutional: She is oriented to person, place, and time.  Neck: Neck supple. No JVD present. No thyromegaly present.  Cardiovascular: Normal rate, regular rhythm and intact distal pulses.   Murmur heard. Pulmonary/Chest: Effort normal and breath sounds normal. No respiratory distress. She has no wheezes. She has no rales.  Abdominal: Soft. Bowel sounds are normal. She exhibits no distension. There is no tenderness. There is no rebound and no guarding.  Musculoskeletal: She exhibits no edema.  Lymphadenopathy:    She has no cervical adenopathy.  Neurological: She is alert and oriented to person, place, and time. She has normal reflexes. No cranial nerve deficit. She exhibits normal muscle tone. Coordination normal.  Skin: Skin is warm. No rash noted. No erythema. No pallor.   1/6 systolic ejection murmur heard best over the aortic valve.        Assessment & Plan:   1. Insomnia I want the patient take one half of a 10 mg Ambien 30 minutes prior to bedtime. If this works I like her to continue this for 2 weeks to try to reestablish a daily sleep routine.  I would then try to wean the patient went from Ambien. I warned the patient about combining this with Klonopin. If one half a tablet does not work the patient can take a full tablet - zolpidem (AMBIEN) 10 MG tablet; Take 1 tablet (10 mg total) by mouth at bedtime as needed for sleep.  Dispense: 30 tablet; Refill: 1  2. HTN (hypertension) Blood pressure here in the office today is elevated. This is likely anxiety and whitecoat syndrome. I asked the patient to start checking her blood pressures at home and report the values to me over the next  week. My goal blood pressure for this patient is 140s over 90s.   - COMPLETE METABOLIC PANEL WITH GFR - CBC with Differential

## 2013-07-09 ENCOUNTER — Encounter: Payer: Self-pay | Admitting: Family Medicine

## 2013-07-21 ENCOUNTER — Ambulatory Visit: Payer: Self-pay | Admitting: Family Medicine

## 2013-07-29 ENCOUNTER — Encounter: Payer: Self-pay | Admitting: Family Medicine

## 2013-07-29 ENCOUNTER — Other Ambulatory Visit: Payer: Self-pay | Admitting: Family Medicine

## 2013-07-29 MED ORDER — CLONAZEPAM 0.5 MG PO TABS: 0.5000 mg | ORAL_TABLET | Freq: Three times a day (TID) | ORAL | Status: DC | PRN

## 2013-08-18 ENCOUNTER — Encounter: Payer: Self-pay | Admitting: Family Medicine

## 2013-10-04 ENCOUNTER — Encounter: Payer: Self-pay | Admitting: Family Medicine

## 2013-10-06 ENCOUNTER — Ambulatory Visit (INDEPENDENT_AMBULATORY_CARE_PROVIDER_SITE_OTHER): Payer: BC Managed Care – PPO | Admitting: Family Medicine

## 2013-10-06 DIAGNOSIS — Z23 Encounter for immunization: Secondary | ICD-10-CM

## 2013-11-01 ENCOUNTER — Encounter: Payer: Self-pay | Admitting: Family Medicine

## 2013-11-29 ENCOUNTER — Encounter: Payer: Self-pay | Admitting: Family Medicine

## 2013-12-26 ENCOUNTER — Encounter: Payer: Self-pay | Admitting: Family Medicine

## 2014-01-25 ENCOUNTER — Encounter: Payer: Self-pay | Admitting: Family Medicine

## 2014-02-15 ENCOUNTER — Encounter: Payer: Self-pay | Admitting: Family Medicine

## 2014-02-15 ENCOUNTER — Ambulatory Visit (INDEPENDENT_AMBULATORY_CARE_PROVIDER_SITE_OTHER): Payer: PPO | Admitting: Family Medicine

## 2014-02-15 VITALS — BP 140/78 | HR 74 | Temp 97.5°F | Resp 14 | Ht 63.0 in | Wt 126.0 lb

## 2014-02-15 DIAGNOSIS — Z23 Encounter for immunization: Secondary | ICD-10-CM

## 2014-02-15 DIAGNOSIS — I10 Essential (primary) hypertension: Secondary | ICD-10-CM

## 2014-02-15 LAB — CBC WITH DIFFERENTIAL/PLATELET
BASOS PCT: 1 % (ref 0–1)
Basophils Absolute: 0.1 10*3/uL (ref 0.0–0.1)
Eosinophils Absolute: 0.2 10*3/uL (ref 0.0–0.7)
Eosinophils Relative: 2 % (ref 0–5)
HEMATOCRIT: 38.6 % (ref 36.0–46.0)
Hemoglobin: 13.3 g/dL (ref 12.0–15.0)
Lymphocytes Relative: 30 % (ref 12–46)
Lymphs Abs: 2.6 10*3/uL (ref 0.7–4.0)
MCH: 30.2 pg (ref 26.0–34.0)
MCHC: 34.5 g/dL (ref 30.0–36.0)
MCV: 87.7 fL (ref 78.0–100.0)
MPV: 10.4 fL (ref 8.6–12.4)
Monocytes Absolute: 0.9 10*3/uL (ref 0.1–1.0)
Monocytes Relative: 10 % (ref 3–12)
NEUTROS ABS: 5 10*3/uL (ref 1.7–7.7)
NEUTROS PCT: 57 % (ref 43–77)
Platelets: 213 10*3/uL (ref 150–400)
RBC: 4.4 MIL/uL (ref 3.87–5.11)
RDW: 13.8 % (ref 11.5–15.5)
WBC: 8.8 10*3/uL (ref 4.0–10.5)

## 2014-02-15 LAB — COMPLETE METABOLIC PANEL WITH GFR
ALT: 16 U/L (ref 0–35)
AST: 15 U/L (ref 0–37)
Albumin: 4.2 g/dL (ref 3.5–5.2)
Alkaline Phosphatase: 53 U/L (ref 39–117)
BILIRUBIN TOTAL: 0.6 mg/dL (ref 0.2–1.2)
BUN: 12 mg/dL (ref 6–23)
CALCIUM: 9.9 mg/dL (ref 8.4–10.5)
CO2: 32 mEq/L (ref 19–32)
CREATININE: 0.72 mg/dL (ref 0.50–1.10)
Chloride: 96 mEq/L (ref 96–112)
GFR, Est African American: >89 mL/min
GFR, Est Non African American: 81 mL/min
GLUCOSE: 82 mg/dL (ref 70–99)
Potassium: 3.8 mEq/L (ref 3.5–5.3)
Sodium: 135 mEq/L (ref 135–145)
Total Protein: 7.1 g/dL (ref 6.0–8.3)

## 2014-02-15 LAB — LIPID PANEL
CHOL/HDL RATIO: 3.5 ratio
Cholesterol: 240 mg/dL — ABNORMAL HIGH (ref 0–200)
HDL: 68 mg/dL (ref >39)
LDL Cholesterol: 124 mg/dL — ABNORMAL HIGH (ref 0–99)
TRIGLYCERIDES: 240 mg/dL — AB (ref <150)
VLDL: 48 mg/dL — AB (ref 0–40)

## 2014-02-15 MED ORDER — FELODIPINE ER 5 MG PO TB24: 5.0000 mg | ORAL_TABLET | Freq: Every day | ORAL | Status: DC

## 2014-02-15 MED ORDER — HYDROCHLOROTHIAZIDE 25 MG PO TABS: 25.0000 mg | ORAL_TABLET | Freq: Every day | ORAL | Status: DC

## 2014-02-15 MED ORDER — POTASSIUM CHLORIDE ER 10 MEQ PO TBCR: 10.0000 meq | EXTENDED_RELEASE_TABLET | Freq: Every day | ORAL | Status: DC

## 2014-02-15 MED ORDER — HYDRALAZINE HCL 25 MG PO TABS: 25.0000 mg | ORAL_TABLET | Freq: Two times a day (BID) | ORAL | Status: DC

## 2014-02-15 MED ORDER — CLONIDINE HCL 0.3 MG/24HR TD PTWK: 0.3000 mg | MEDICATED_PATCH | TRANSDERMAL | Status: DC

## 2014-02-15 MED ORDER — IRBESARTAN 300 MG PO TABS: 300.0000 mg | ORAL_TABLET | Freq: Every day | ORAL | Status: DC

## 2014-02-15 MED ORDER — OMEPRAZOLE 40 MG PO CPDR: 40.0000 mg | DELAYED_RELEASE_CAPSULE | Freq: Every day | ORAL | Status: DC

## 2014-02-15 NOTE — Progress Notes (Signed)
Subjective:    Patient ID: Kathe Mariner, female    DOB: Feb 23, 1934, 80 y.o.   MRN: [id number]  HPI A she is here today for follow-up of her hypertension. She is on numerous medications for hypertension including clonidine, felodipine, by systolic, hydrochlorothiazide, irbesartan. Her blood pressure today is well controlled at 140/78. She denies any chest pain shortness of breath or dyspnea on exertion. Her mammogram is up-to-date. Her colonoscopy is up-to-date. She is due for a booster of Pneumovax 23. Otherwise she has no concerns. AOverall she feels well. Past Medical History  Diagnosis Date  . Hypertension   . GERD (gastroesophageal reflux disease)   . Chest pain   . Insomnia    Past Surgical History  Procedure Laterality Date  . Abdominal hysterectomy     Current Outpatient Prescriptions on File Prior to Visit  Medication Sig Dispense Refill  . calcium-vitamin D (OSCAL WITH D) 250-125 MG-UNIT per tablet Take 1 tablet by mouth 2 (two) times daily.    . clonazePAM (KLONOPIN) 0.5 MG tablet Take 1 tablet (0.5 mg total) by mouth every 8 (eight) hours as needed for anxiety. 60 tablet 2  . cloNIDine (CATAPRES - DOSED IN MG/24 HR) 0.3 mg/24hr patch Place 1 patch (0.3 mg total) onto the skin once a week. 16 patch 4  . felodipine (PLENDIL) 5 MG 24 hr tablet Take 1 tablet (5 mg total) by mouth daily. 90 tablet 4  . fluticasone (FLONASE) 50 MCG/ACT nasal spray Place 2 sprays into the nose daily. 16 g 6  . hydrALAZINE (APRESOLINE) 25 MG tablet Take 1 tablet (25 mg total) by mouth 2 (two) times daily. 180 tablet 3  . hydrochlorothiazide (HYDRODIURIL) 25 MG tablet Take 2 tablets (50 mg total) by mouth daily. (Patient taking differently: Take 25 mg by mouth daily. ) 180 tablet 3  . irbesartan (AVAPRO) 300 MG tablet Take 1 tablet (300 mg total) by mouth daily. 90 tablet 3  . nebivolol (BYSTOLIC) 10 MG tablet Take 1 tablet (10 mg total) by mouth daily. 90 tablet 3  . Omega-3 Fatty Acids (FISH  OIL) 1200 MG CAPS Take 2 capsules by mouth.     Marland Kitchen omeprazole (PRILOSEC) 40 MG capsule Take 1 capsule (40 mg total) by mouth daily. 90 capsule 3  . potassium chloride (KLOR-CON 10) 10 MEQ tablet Take 1 tablet (10 mEq total) by mouth daily. 90 tablet 3   No current facility-administered medications on file prior to visit.   No Known Allergies History   Social History  . Marital Status: Married    Spouse Name: N/A    Number of Children: N/A  . Years of Education: N/A   Occupational History  . Not on file.   Social History Main Topics  . Smoking status: Never Smoker   . Smokeless tobacco: Not on file  . Alcohol Use: Yes     Comment: wine occasionally  . Drug Use: No  . Sexual Activity: Not on file   Other Topics Concern  . Not on file   Social History Narrative      Review of Systems  All other systems reviewed and are negative.      Objective:   Physical Exam  Constitutional: She appears well-developed and well-nourished.  Neck: Neck supple. No JVD present. No thyromegaly present.  Cardiovascular: Normal rate, regular rhythm, normal heart sounds and intact distal pulses.   No murmur heard. Pulmonary/Chest: Effort normal and breath sounds normal. No respiratory distress. She has no  wheezes. She has no rales. She exhibits no tenderness.  Abdominal: Soft. Bowel sounds are normal. She exhibits no distension. There is no tenderness. There is no rebound and no guarding.  Musculoskeletal: She exhibits no edema.  Lymphadenopathy:    She has no cervical adenopathy.  Vitals reviewed.         Assessment & Plan:  Benign essential HTN - Plan: COMPLETE METABOLIC PANEL WITH GFR, Lipid panel, CBC with Differential  I am very happy with the patient's blood pressure today. I will check a CMP as well as a fasting lipid panel. Patient received a booster on Pneumovax 23 while she is here today. Follow-up otherwise in 6 months or as needed.

## 2014-02-15 NOTE — Addendum Note (Signed)
Addended by: Legrand RamsWILLIS, SANDY B on: 02/15/2014 11:15 AM   Modules accepted: Orders

## 2014-02-17 ENCOUNTER — Encounter: Payer: Self-pay | Admitting: Family Medicine

## 2014-02-18 ENCOUNTER — Encounter: Payer: Self-pay | Admitting: *Deleted

## 2014-03-21 ENCOUNTER — Encounter: Payer: Self-pay | Admitting: Family Medicine

## 2014-04-07 ENCOUNTER — Encounter: Payer: Self-pay | Admitting: Family Medicine

## 2014-05-16 ENCOUNTER — Encounter: Payer: Self-pay | Admitting: Family Medicine

## 2014-06-01 ENCOUNTER — Encounter: Payer: Self-pay | Admitting: Physician Assistant

## 2014-06-01 ENCOUNTER — Ambulatory Visit (INDEPENDENT_AMBULATORY_CARE_PROVIDER_SITE_OTHER): Payer: PPO | Admitting: Physician Assistant

## 2014-06-01 VITALS — BP 144/70 | HR 80 | Temp 98.1°F | Resp 18 | Wt 124.0 lb

## 2014-06-01 DIAGNOSIS — J988 Other specified respiratory disorders: Secondary | ICD-10-CM | POA: Diagnosis not present

## 2014-06-01 DIAGNOSIS — B9689 Other specified bacterial agents as the cause of diseases classified elsewhere: Principal | ICD-10-CM

## 2014-06-01 MED ORDER — AZITHROMYCIN 250 MG PO TABS: ORAL_TABLET | ORAL | Status: DC

## 2014-06-01 NOTE — Progress Notes (Signed)
Patient ID: Brittany Levy MRN: [id number], DOB: 1934-06-12, 80 y.o. Date of Encounter: 06/01/2014, 9:13 AM    Chief Complaint:  Chief Complaint  Patient presents with  . sick x 4 days    sinus infection, slight fever yesterday, head very congested     HPI: 80 y.o. year old white female states that she actually has been sick for about 6 days now. Says that she has "a lot of stuff in her head". Says that when she blows her nose she does get some thick colored mucus especially from the left side more so than the right nares. She has chest congestion with congested cough. says because she has high blood pressure she usually avoids any over-the-counter medications. Says that she did take 2 Advil cold and sinus but then quit using anything until she came in. Does not smoke.     Home Meds:   Outpatient Prescriptions Prior to Visit  Medication Sig Dispense Refill  . aspirin 81 MG tablet Take 81 mg by mouth daily.    . calcium-vitamin D (OSCAL WITH D) 250-125 MG-UNIT per tablet Take 1 tablet by mouth 2 (two) times daily.    . clonazePAM (KLONOPIN) 0.5 MG tablet Take 1 tablet (0.5 mg total) by mouth every 8 (eight) hours as needed for anxiety. 60 tablet 2  . cloNIDine (CATAPRES - DOSED IN MG/24 HR) 0.3 mg/24hr patch Place 1 patch (0.3 mg total) onto the skin once a week. 16 patch 3  . felodipine (PLENDIL) 5 MG 24 hr tablet Take 1 tablet (5 mg total) by mouth daily. 90 tablet 3  . fluticasone (FLONASE) 50 MCG/ACT nasal spray Place 2 sprays into the nose daily. 16 g 6  . hydrALAZINE (APRESOLINE) 25 MG tablet Take 1 tablet (25 mg total) by mouth 2 (two) times daily. 180 tablet 3  . hydrochlorothiazide (HYDRODIURIL) 25 MG tablet Take 1 tablet (25 mg total) by mouth daily. 180 tablet 3  . irbesartan (AVAPRO) 300 MG tablet Take 1 tablet (300 mg total) by mouth daily. 90 tablet 3  . nebivolol (BYSTOLIC) 10 MG tablet Take 1 tablet (10 mg total) by mouth daily. 90 tablet 3  . Omega-3 Fatty Acids  (FISH OIL) 1200 MG CAPS Take 2 capsules by mouth.     Marland Kitchen omeprazole (PRILOSEC) 40 MG capsule Take 1 capsule (40 mg total) by mouth daily. 90 capsule 3  . potassium chloride (KLOR-CON 10) 10 MEQ tablet Take 1 tablet (10 mEq total) by mouth daily. 90 tablet 3   No facility-administered medications prior to visit.    Allergies: No Known Allergies    Review of Systems: See HPI for pertinent ROS. All other ROS negative.    Physical Exam: Blood pressure 144/70, pulse 80, temperature 98.1 F (36.7 C), temperature source Oral, resp. rate 18, weight 124 lb (56.246 kg)., Body mass index is 21.97 kg/(m^2). General:  WNWD WF. Appears in no acute distress. HEENT: Normocephalic, atraumatic, eyes without discharge, sclera non-icteric, nares are without discharge. Bilateral auditory canals clear, TM's are without perforation, pearly grey and translucent with reflective cone of light bilaterally. Oral cavity moist, posterior pharynx without exudate, erythema, peritonsillar abscess. No tenderness with percussion of frontal and maxillary sinuses bilaterally.  Neck: Supple. No thyromegaly. No lymphadenopathy. Lungs: She has very deep congested cough during the visit. She has some few rhonchi along the bases bilaterally. Otherwise lungs are clear with good breath sounds. Heart: Regular rhythm. No murmurs, rubs, or gallops. Msk:  Strength and  tone normal for age. Extremities/Skin: Warm and dry.  Neuro: Alert and oriented X 3. Moves all extremities spontaneously. Gait is normal. CNII-XII grossly in tact. Psych:  Responds to questions appropriately with a normal affect.     ASSESSMENT AND PLAN:  80 y.o. year old female with  1. Bacterial respiratory infection Take antibiotic as directed. Also use Mucinex DM as expectorant. Follow-up if symptoms worsen or do not resolve within 1 week of completion of antibiotic. - azithromycin (ZITHROMAX) 250 MG tablet; Day 1: Take 2 daily.  Days 2-5: Take 1 daily.  Dispense:  6 tablet; Refill: 0   Signed, [address]Mary Beth [place], [place], [facility] 06/01/2014 9:13 AM

## 2014-06-20 ENCOUNTER — Encounter: Payer: Self-pay | Admitting: Family Medicine

## 2014-07-06 ENCOUNTER — Encounter: Payer: Self-pay | Admitting: Family Medicine

## 2014-08-23 ENCOUNTER — Encounter: Payer: Self-pay | Admitting: Family Medicine

## 2014-08-26 ENCOUNTER — Telehealth: Payer: Self-pay | Admitting: Family Medicine

## 2014-08-26 MED ORDER — CARVEDILOL 6.25 MG PO TABS: 6.2500 mg | ORAL_TABLET | Freq: Two times a day (BID) | ORAL | Status: DC

## 2014-08-26 NOTE — Telephone Encounter (Signed)
Per Dr. Tanya Nones ok to change Bystolic to coreg 6.25 mg bid - med sent to pharm and pt aware via mychart of possibility of slower heart rate and if that occurs to contact our office.

## 2014-08-26 NOTE — Telephone Encounter (Signed)
Pt has been getting samples of Bystolic however we are out and she went to the pharm and her copay for bystolic is $135. Pharmacists recommended switching to Coreg and it would only be $6. Could she switch medications? If not that is ok she will just need a rx for the bystolic.

## 2014-08-29 ENCOUNTER — Ambulatory Visit (INDEPENDENT_AMBULATORY_CARE_PROVIDER_SITE_OTHER): Payer: PPO | Admitting: Family Medicine

## 2014-08-29 ENCOUNTER — Encounter: Payer: Self-pay | Admitting: Family Medicine

## 2014-08-29 VITALS — BP 156/68 | HR 74 | Temp 98.5°F | Resp 16 | Ht 63.0 in | Wt 127.0 lb

## 2014-08-29 DIAGNOSIS — I10 Essential (primary) hypertension: Secondary | ICD-10-CM | POA: Diagnosis not present

## 2014-08-29 MED ORDER — CLONAZEPAM 0.5 MG PO TABS: 0.5000 mg | ORAL_TABLET | Freq: Three times a day (TID) | ORAL | Status: DC | PRN

## 2014-08-29 NOTE — Progress Notes (Signed)
Subjective:    Patient ID: Brittany Levy, female    DOB: 1934/03/14, 80 y.o.   MRN: [id number]  HPI  Patient is a very pleasant 80 year old white female who is here today for follow-up of her blood pressure. Recently we had to discontinue her bystolic due to cost. This was replaced by carvedilol 6.25 mg by mouth twice a day. The patient has been leery of taking 2 pills a day she is only been taking 1. Her heart rate is within a normal range at 74 bpm however her blood pressure is elevated. This patient has a significant history of white coat syndrome and blood pressures in the office are typically higher than her home blood pressures to begin with. She denies any chest pain shortness of breath or dyspnea on exertion. Immunizations and colonoscopy are up-to-date. She is overdue for mammogram.  Past Medical History  Diagnosis Date  . Hypertension   . GERD (gastroesophageal reflux disease)   . Chest pain   . Insomnia    Past Surgical History  Procedure Laterality Date  . Abdominal hysterectomy     Current Outpatient Prescriptions on File Prior to Visit  Medication Sig Dispense Refill  . aspirin 81 MG tablet Take 81 mg by mouth daily.    Marland Kitchen azithromycin (ZITHROMAX) 250 MG tablet Day 1: Take 2 daily.  Days 2-5: Take 1 daily. 6 tablet 0  . calcium-vitamin D (OSCAL WITH D) 250-125 MG-UNIT per tablet Take 1 tablet by mouth 2 (two) times daily.    . carvedilol (COREG) 6.25 MG tablet Take 1 tablet (6.25 mg total) by mouth 2 (two) times daily with a meal. 180 tablet 3  . clonazePAM (KLONOPIN) 0.5 MG tablet Take 1 tablet (0.5 mg total) by mouth every 8 (eight) hours as needed for anxiety. 60 tablet 2  . cloNIDine (CATAPRES - DOSED IN MG/24 HR) 0.3 mg/24hr patch Place 1 patch (0.3 mg total) onto the skin once a week. 16 patch 3  . felodipine (PLENDIL) 5 MG 24 hr tablet Take 1 tablet (5 mg total) by mouth daily. 90 tablet 3  . fluticasone (FLONASE) 50 MCG/ACT nasal spray Place 2 sprays into the nose  daily. 16 g 6  . hydrALAZINE (APRESOLINE) 25 MG tablet Take 1 tablet (25 mg total) by mouth 2 (two) times daily. 180 tablet 3  . hydrochlorothiazide (HYDRODIURIL) 25 MG tablet Take 1 tablet (25 mg total) by mouth daily. 180 tablet 3  . irbesartan (AVAPRO) 300 MG tablet Take 1 tablet (300 mg total) by mouth daily. 90 tablet 3  . nebivolol (BYSTOLIC) 10 MG tablet Take 1 tablet (10 mg total) by mouth daily. 90 tablet 3  . Omega-3 Fatty Acids (FISH OIL) 1200 MG CAPS Take 2 capsules by mouth.     Marland Kitchen omeprazole (PRILOSEC) 40 MG capsule Take 1 capsule (40 mg total) by mouth daily. 90 capsule 3  . potassium chloride (KLOR-CON 10) 10 MEQ tablet Take 1 tablet (10 mEq total) by mouth daily. 90 tablet 3   No current facility-administered medications on file prior to visit.   No Known Allergies History   Social History  . Marital Status: Married    Spouse Name: N/A  . Number of Children: N/A  . Years of Education: N/A   Occupational History  . Not on file.   Social History Main Topics  . Smoking status: Never Smoker   . Smokeless tobacco: Not on file  . Alcohol Use: Yes     Comment:  wine occasionally  . Drug Use: No  . Sexual Activity: Not on file   Other Topics Concern  . Not on file   Social History Narrative     Review of Systems  All other systems reviewed and are negative.      Objective:   Physical Exam  Constitutional: She appears well-developed and well-nourished.  Neck: Neck supple. No JVD present.  Cardiovascular: Normal rate, regular rhythm and normal heart sounds.   No murmur heard. Pulmonary/Chest: Effort normal and breath sounds normal. No respiratory distress. She has no wheezes. She has no rales.  Abdominal: Soft. Bowel sounds are normal. She exhibits no distension. There is no tenderness. There is no rebound.  Musculoskeletal: She exhibits no edema.  Lymphadenopathy:    She has no cervical adenopathy.  Vitals reviewed.         Assessment & Plan:    Benign essential HTN - Plan: BASIC METABOLIC PANEL WITH GFR  Patient's blood pressure seems elevated. I have asked her to check her blood pressure daily for the next week or 2 in and provide the values for me to review. I would like her to increase the carvedilol to 6.25 mg by mouth twice a day. I will check a BMP to monitor her kidney function as well as her potassium. Also suggested that the patient get a mammogram. She can return anytime she would like fasting for cholesterol screen. However 6 months ago we checked her cholesterol and was acceptable.

## 2014-08-30 ENCOUNTER — Encounter: Payer: Self-pay | Admitting: Family Medicine

## 2014-08-30 LAB — BASIC METABOLIC PANEL WITH GFR
BUN: 11 mg/dL (ref 7–25)
CALCIUM: 9.5 mg/dL (ref 8.6–10.4)
CHLORIDE: 97 mmol/L — AB (ref 98–110)
CO2: 30 mmol/L (ref 20–31)
Creat: 0.74 mg/dL (ref 0.60–0.93)
GFR, Est African American: >89 mL/min (ref >=60)
GFR, Est Non African American: 78 mL/min (ref >=60)
GLUCOSE: 82 mg/dL (ref 70–99)
POTASSIUM: 3.9 mmol/L (ref 3.5–5.3)
Sodium: 135 mmol/L (ref 135–146)

## 2014-12-26 ENCOUNTER — Other Ambulatory Visit: Payer: Self-pay | Admitting: Family Medicine

## 2015-02-13 ENCOUNTER — Other Ambulatory Visit: Payer: Self-pay | Admitting: Family Medicine

## 2015-02-13 ENCOUNTER — Encounter: Payer: Self-pay | Admitting: Family Medicine

## 2015-02-13 MED ORDER — POTASSIUM CHLORIDE ER 10 MEQ PO TBCR: 10.0000 meq | EXTENDED_RELEASE_TABLET | Freq: Every day | ORAL | Status: DC

## 2015-02-13 NOTE — Telephone Encounter (Signed)
Medication called/sent to requested pharmacy  

## 2015-03-02 ENCOUNTER — Ambulatory Visit (INDEPENDENT_AMBULATORY_CARE_PROVIDER_SITE_OTHER): Payer: PPO | Admitting: Family Medicine

## 2015-03-02 ENCOUNTER — Encounter: Payer: Self-pay | Admitting: Family Medicine

## 2015-03-02 VITALS — BP 162/60 | HR 80 | Temp 98.0°F | Resp 16 | Ht 63.0 in | Wt 121.0 lb

## 2015-03-02 DIAGNOSIS — R0989 Other specified symptoms and signs involving the circulatory and respiratory systems: Secondary | ICD-10-CM | POA: Diagnosis not present

## 2015-03-02 DIAGNOSIS — I10 Essential (primary) hypertension: Secondary | ICD-10-CM

## 2015-03-02 DIAGNOSIS — J989 Respiratory disorder, unspecified: Secondary | ICD-10-CM

## 2015-03-02 LAB — LIPID PANEL
Cholesterol: 248 mg/dL — ABNORMAL HIGH (ref 125–200)
HDL: 75 mg/dL (ref >=46)
LDL Cholesterol: 150 mg/dL — ABNORMAL HIGH (ref <130)
TRIGLYCERIDES: 115 mg/dL (ref <150)
Total CHOL/HDL Ratio: 3.3 Ratio (ref <=5.0)
VLDL: 23 mg/dL (ref <30)

## 2015-03-02 LAB — CBC WITH DIFFERENTIAL/PLATELET
Basophils Absolute: 0.1 10*3/uL (ref 0.0–0.1)
Basophils Relative: 1 % (ref 0–1)
EOS ABS: 0.2 10*3/uL (ref 0.0–0.7)
Eosinophils Relative: 3 % (ref 0–5)
HEMATOCRIT: 38.7 % (ref 36.0–46.0)
HEMOGLOBIN: 13.4 g/dL (ref 12.0–15.0)
LYMPHS ABS: 2.7 10*3/uL (ref 0.7–4.0)
LYMPHS PCT: 47 % — AB (ref 12–46)
MCH: 30.7 pg (ref 26.0–34.0)
MCHC: 34.6 g/dL (ref 30.0–36.0)
MCV: 88.8 fL (ref 78.0–100.0)
MONOS PCT: 10 % (ref 3–12)
MPV: 10.3 fL (ref 8.6–12.4)
Monocytes Absolute: 0.6 10*3/uL (ref 0.1–1.0)
NEUTROS ABS: 2.3 10*3/uL (ref 1.7–7.7)
NEUTROS PCT: 39 % — AB (ref 43–77)
Platelets: 205 10*3/uL (ref 150–400)
RBC: 4.36 MIL/uL (ref 3.87–5.11)
RDW: 13.6 % (ref 11.5–15.5)
WBC: 5.8 10*3/uL (ref 4.0–10.5)

## 2015-03-02 LAB — COMPLETE METABOLIC PANEL WITH GFR
ALK PHOS: 56 U/L (ref 33–130)
ALT: 12 U/L (ref 6–29)
AST: 13 U/L (ref 10–35)
Albumin: 4.2 g/dL (ref 3.6–5.1)
BILIRUBIN TOTAL: 0.7 mg/dL (ref 0.2–1.2)
BUN: 15 mg/dL (ref 7–25)
CALCIUM: 9.7 mg/dL (ref 8.6–10.4)
CO2: 31 mmol/L (ref 20–31)
Chloride: 96 mmol/L — ABNORMAL LOW (ref 98–110)
Creat: 0.84 mg/dL (ref 0.60–0.93)
GFR, EST AFRICAN AMERICAN: 77 mL/min (ref >=60)
GFR, EST NON AFRICAN AMERICAN: 67 mL/min (ref >=60)
Glucose, Bld: 79 mg/dL (ref 70–99)
Potassium: 3.6 mmol/L (ref 3.5–5.3)
Sodium: 135 mmol/L (ref 135–146)
TOTAL PROTEIN: 7 g/dL (ref 6.1–8.1)

## 2015-03-02 MED ORDER — ALBUTEROL SULFATE HFA 108 (90 BASE) MCG/ACT IN AERS: 2.0000 | INHALATION_SPRAY | Freq: Four times a day (QID) | RESPIRATORY_TRACT | Status: DC | PRN

## 2015-03-02 NOTE — Progress Notes (Signed)
Subjective:    Patient ID: Brittany Levy, female    DOB: [birth year], 81 y.o.   MRN: [id number]  HPI  7/15 Patient is a very pleasant 81 year old white female who is here today for follow-up of her blood pressure. Recently we had to discontinue her bystolic due to cost. This was replaced by carvedilol 6.25 mg by mouth twice a day. The patient has been leery of taking 2 pills a day she is only been taking 1. Her heart rate is within a normal range at 74 bpm however her blood pressure is elevated. This patient has a significant history of white coat syndrome and blood pressures in the office are typically higher than her home blood pressures to begin with. She denies any chest pain shortness of breath or dyspnea on exertion. Immunizations and colonoscopy are up-to-date. She is overdue for mammogram.  At that time, my plan was: Patient's blood pressure seems elevated. I have asked her to check her blood pressure daily for the next week or 2 in and provide the values for me to review. I would like her to increase the carvedilol to 6.25 mg by mouth twice a day. I will check a BMP to monitor her kidney function as well as her potassium. Also suggested that the patient get a mammogram. She can return anytime she would like fasting for cholesterol screen. However 6 months ago we checked her cholesterol and was acceptable.  03/02/15  patient has been doing very well. 81 She is checking her blood pressure regularly at home and it is consistently 110-130/70-80. She has a documented history of white coat syndrome and her blood pressure today is elevated. It had already improved to 152/60 after talking to me. Therefore I believe that this is fictitious. Otherwise she is doing well. She denies any chest pain shortness of breath or dyspnea on exertion. Patient scheduled for mammogram is due. She is not due for colonoscopy until 2020. She does not require Pap smears due to her history of a hysterectomy. She is due for a  flu shot today  Past Medical History  Diagnosis Date  . Hypertension   . GERD (gastroesophageal reflux disease)   . Chest pain   . Insomnia    Past Surgical History  Procedure Laterality Date  . Abdominal hysterectomy     Current Outpatient Prescriptions on File Prior to Visit  Medication Sig Dispense Refill  . aspirin 81 MG tablet Take 81 mg by mouth daily.    . calcium-vitamin D (OSCAL WITH D) 250-125 MG-UNIT per tablet Take 1 tablet by mouth 2 (two) times daily.    . carvedilol (COREG) 6.25 MG tablet Take 1 tablet (6.25 mg total) by mouth 2 (two) times daily with a meal. (Patient taking differently: Take 6.25 mg by mouth daily. 1 tab po QAM and 1/2 tab po QPM) 180 tablet 3  . clonazePAM (KLONOPIN) 0.5 MG tablet Take 1 tablet (0.5 mg total) by mouth every 8 (eight) hours as needed for anxiety. 60 tablet 2  . cloNIDine (CATAPRES - DOSED IN MG/24 HR) 0.3 mg/24hr patch Place 1 patch (0.3 mg total) onto the skin once a week. 16 patch 3  . felodipine (PLENDIL) 5 MG 24 hr tablet Take 1 tablet (5 mg total) by mouth daily. 90 tablet 3  . fluticasone (FLONASE) 50 MCG/ACT nasal spray Place 2 sprays into the nose daily. 16 g 6  . hydrALAZINE (APRESOLINE) 25 MG tablet Take 1 tablet (25 mg total) by mouth 2 (  two) times daily. 180 tablet 3  . hydrochlorothiazide (HYDRODIURIL) 25 MG tablet Take 1 tablet (25 mg total) by mouth daily. 180 tablet 3  . irbesartan (AVAPRO) 300 MG tablet TAKE 1 TABLET BY MOUTH ONCE A DAY 90 tablet 3  . Omega-3 Fatty Acids (FISH OIL) 1200 MG CAPS Take 2 capsules by mouth.     Marland Kitchen omeprazole (PRILOSEC) 40 MG capsule Take 1 capsule (40 mg total) by mouth daily. 90 capsule 3  . potassium chloride (KLOR-CON 10) 10 MEQ tablet Take 1 tablet (10 mEq total) by mouth daily. 90 tablet 3   No current facility-administered medications on file prior to visit.   No Known Allergies Social History   Social History  . Marital Status: Married    Spouse Name: N/A  . Number of Children:  N/A  . Years of Education: N/A   Occupational History  . Not on file.   Social History Main Topics  . Smoking status: Never Smoker   . Smokeless tobacco: Not on file  . Alcohol Use: Yes     Comment: wine occasionally  . Drug Use: No  . Sexual Activity: Not on file   Other Topics Concern  . Not on file   Social History Narrative     Review of Systems  All other systems reviewed and are negative.      Objective:   Physical Exam  Constitutional: She appears well-developed and well-nourished.  Neck: Neck supple. No JVD present.  Cardiovascular: Normal rate, regular rhythm and normal heart sounds.   No murmur heard. Pulmonary/Chest: Effort normal and breath sounds normal. No respiratory distress. She has no wheezes. She has no rales.  Abdominal: Soft. Bowel sounds are normal. She exhibits no distension. There is no tenderness. There is no rebound.  Musculoskeletal: She exhibits no edema.  Lymphadenopathy:    She has no cervical adenopathy.  Vitals reviewed.         Assessment & Plan:  Benign essential HTN - Plan: CBC with Differential/Platelet, COMPLETE METABOLIC PANEL WITH GFR, Lipid panel  Reactive airway disease that is not asthma - Plan: albuterol (PROVENTIL HFA;VENTOLIN HFA) 108 (90 Base) MCG/ACT inhaler   I will check a fasting lipid panel while the patient's ear. I believe her blood pressures elevated due to whitecoat syndrome. I have asked her to check her blood pressure at home and notify me of the values in one week. If greater than 140/90 consistently I'll make changes in her medication. She also got her flu shot today

## 2015-03-07 ENCOUNTER — Other Ambulatory Visit: Payer: Self-pay | Admitting: Family Medicine

## 2015-03-07 DIAGNOSIS — Z1231 Encounter for screening mammogram for malignant neoplasm of breast: Secondary | ICD-10-CM

## 2015-03-09 ENCOUNTER — Ambulatory Visit
Admission: RE | Admit: 2015-03-09 | Discharge: 2015-03-09 | Disposition: A | Discharge status: Home / Self Care | Payer: PPO | Source: Ambulatory Visit | Attending: Family Medicine | Admitting: Family Medicine

## 2015-03-09 ENCOUNTER — Other Ambulatory Visit: Payer: Self-pay | Admitting: Family Medicine

## 2015-03-09 DIAGNOSIS — Z1231 Encounter for screening mammogram for malignant neoplasm of breast: Secondary | ICD-10-CM | POA: Diagnosis not present

## 2015-03-27 ENCOUNTER — Other Ambulatory Visit: Payer: Self-pay | Admitting: Family Medicine

## 2015-04-14 DIAGNOSIS — H00022 Hordeolum internum right lower eyelid: Secondary | ICD-10-CM | POA: Diagnosis not present

## 2015-04-14 DIAGNOSIS — H0012 Chalazion right lower eyelid: Secondary | ICD-10-CM | POA: Diagnosis not present

## 2015-05-15 ENCOUNTER — Other Ambulatory Visit: Payer: Self-pay | Admitting: Family Medicine

## 2015-05-15 NOTE — Telephone Encounter (Signed)
Refill appropriate and filled per protocol. 

## 2015-06-06 ENCOUNTER — Ambulatory Visit (INDEPENDENT_AMBULATORY_CARE_PROVIDER_SITE_OTHER): Payer: PPO | Admitting: Family Medicine

## 2015-06-06 ENCOUNTER — Encounter: Payer: Self-pay | Admitting: Family Medicine

## 2015-06-06 ENCOUNTER — Other Ambulatory Visit: Payer: Self-pay | Admitting: Family Medicine

## 2015-06-06 VITALS — BP 140/78 | HR 78 | Temp 97.7°F | Resp 16 | Wt 119.0 lb

## 2015-06-06 DIAGNOSIS — J329 Chronic sinusitis, unspecified: Secondary | ICD-10-CM | POA: Diagnosis not present

## 2015-06-06 NOTE — Progress Notes (Signed)
Subjective:    Patient ID: Brittany Levy, female    DOB: 07/31/1934, 81 y.o.   MRN: [id number]  HPI Symptoms began last week with copious rhinorrhea that was thick yellow but not purulent. Patient constantly was blowing her nose last week. She also reports itchy watery eyes and sneezing. Now she is having postnasal drainage causing a cough productive of clear and yellow mucus. She denies any fevers or sinus pain but she does report sinus pressure and dull headaches. She denies any sore throat. She denies any shortness of breath or pleurisy. She is not wheezing on examination today. She denies any hemoptysis or chest pain. Past Medical History  Diagnosis Date  . Hypertension   . GERD (gastroesophageal reflux disease)   . Chest pain   . Insomnia    Past Surgical History  Procedure Laterality Date  . Abdominal hysterectomy     Current Outpatient Prescriptions on File Prior to Visit  Medication Sig Dispense Refill  . albuterol (PROVENTIL HFA;VENTOLIN HFA) 108 (90 Base) MCG/ACT inhaler Inhale 2 puffs into the lungs every 6 (six) hours as needed for wheezing or shortness of breath. 1 Inhaler 0  . aspirin 81 MG tablet Take 81 mg by mouth daily.    . calcium-vitamin D (OSCAL WITH D) 250-125 MG-UNIT per tablet Take 1 tablet by mouth 2 (two) times daily.    . carvedilol (COREG) 6.25 MG tablet Take 1 tablet (6.25 mg total) by mouth 2 (two) times daily with a meal. (Patient taking differently: Take 6.25 mg by mouth daily. 1 tab po QAM and 1/2 tab po QPM) 180 tablet 3  . clonazePAM (KLONOPIN) 0.5 MG tablet Take 1 tablet (0.5 mg total) by mouth every 8 (eight) hours as needed for anxiety. 60 tablet 2  . cloNIDine (CATAPRES - DOSED IN MG/24 HR) 0.3 mg/24hr patch PLACE 1 PATCH ON THE SKIN ONCE WEEKLY ASDIRECTED 16 patch 4  . felodipine (PLENDIL) 5 MG 24 hr tablet TAKE 1 TABLET BY MOUTH ONCE A DAY 90 tablet 4  . fluticasone (FLONASE) 50 MCG/ACT nasal spray Place 2 sprays into the nose daily. 16 g 6  .  hydrALAZINE (APRESOLINE) 25 MG tablet TAKE 1 TABLET BY MOUTH TWICE A DAY 180 tablet 4  . hydrochlorothiazide (HYDRODIURIL) 25 MG tablet TAKE 1 TABLET BY MOUTH ONCE A DAY 180 tablet 1  . irbesartan (AVAPRO) 300 MG tablet TAKE 1 TABLET BY MOUTH ONCE A DAY 90 tablet 3  . Omega-3 Fatty Acids (FISH OIL) 1200 MG CAPS Take 2 capsules by mouth.     Marland Kitchen omeprazole (PRILOSEC) 40 MG capsule Take 1 capsule (40 mg total) by mouth daily. 90 capsule 3  . potassium chloride (KLOR-CON 10) 10 MEQ tablet Take 1 tablet (10 mEq total) by mouth daily. 90 tablet 3   No current facility-administered medications on file prior to visit.   No Known Allergies Social History   Social History  . Marital Status: Married    Spouse Name: N/A  . Number of Children: N/A  . Years of Education: N/A   Occupational History  . Not on file.   Social History Main Topics  . Smoking status: Never Smoker   . Smokeless tobacco: Not on file  . Alcohol Use: Yes     Comment: wine occasionally  . Drug Use: No  . Sexual Activity: Not on file   Other Topics Concern  . Not on file   Social History Narrative      Review of  Systems  All other systems reviewed and are negative.      Objective:   Physical Exam  Constitutional: She appears well-developed and well-nourished.  HENT:  Right Ear: Tympanic membrane, external ear and ear canal normal.  Left Ear: Tympanic membrane, external ear and ear canal normal.  Nose: Mucosal edema and rhinorrhea present. Right sinus exhibits no maxillary sinus tenderness and no frontal sinus tenderness. Left sinus exhibits no maxillary sinus tenderness and no frontal sinus tenderness.  Mouth/Throat: Oropharynx is clear and moist. No oropharyngeal exudate.  Eyes: Conjunctivae are normal.  Neck: Neck supple.  Cardiovascular: Normal rate, regular rhythm and normal heart sounds.   Pulmonary/Chest: Effort normal and breath sounds normal. No respiratory distress. She has no wheezes. She has no  rales.  Lymphadenopathy:    She has no cervical adenopathy.  Vitals reviewed.         Assessment & Plan:  Rhinosinusitis  I believe the patient is having rhinosinusitis and postnasal drip secondary to seasonal allergies and pollen. I recommended Zyrtec 10 mg every day until allergy season subsides. I've also recommended Flonase 2 sprays each nostril daily. I suspect symptoms will gradually improve over the next 3 days. Recheck immediately if symptoms worsen. Consider a sinus infection should she develop fevers or worsening sinus pain.

## 2015-06-06 NOTE — Telephone Encounter (Signed)
Refill appropriate and filled per protocol. 

## 2015-06-07 ENCOUNTER — Encounter: Payer: Self-pay | Admitting: Family Medicine

## 2015-06-07 MED ORDER — FLUTICASONE PROPIONATE 50 MCG/ACT NA SUSP: 2.0000 | Freq: Every day | NASAL | Status: DC

## 2015-09-18 ENCOUNTER — Other Ambulatory Visit: Payer: Self-pay | Admitting: Family Medicine

## 2015-09-18 NOTE — Telephone Encounter (Signed)
Refill appropriate and filled per protocol. 

## 2015-12-21 ENCOUNTER — Other Ambulatory Visit: Payer: Self-pay | Admitting: Family Medicine

## 2015-12-25 ENCOUNTER — Other Ambulatory Visit: Payer: Self-pay | Admitting: Family Medicine

## 2016-02-20 ENCOUNTER — Other Ambulatory Visit: Payer: Self-pay | Admitting: Family Medicine

## 2016-02-20 NOTE — Telephone Encounter (Signed)
RX filled per protocol 

## 2016-02-26 ENCOUNTER — Other Ambulatory Visit: Payer: Self-pay | Admitting: Family Medicine

## 2016-02-26 ENCOUNTER — Other Ambulatory Visit: Payer: PPO

## 2016-02-26 DIAGNOSIS — Z79899 Other long term (current) drug therapy: Secondary | ICD-10-CM

## 2016-02-26 DIAGNOSIS — Z0000 Encounter for general adult medical examination without abnormal findings: Secondary | ICD-10-CM | POA: Diagnosis not present

## 2016-02-26 DIAGNOSIS — E785 Hyperlipidemia, unspecified: Secondary | ICD-10-CM

## 2016-02-26 DIAGNOSIS — I10 Essential (primary) hypertension: Secondary | ICD-10-CM | POA: Diagnosis not present

## 2016-02-26 LAB — COMPLETE METABOLIC PANEL WITH GFR
ALBUMIN: 3.9 g/dL (ref 3.6–5.1)
ALT: 10 U/L (ref 6–29)
AST: 11 U/L (ref 10–35)
Alkaline Phosphatase: 58 U/L (ref 33–130)
BUN: 17 mg/dL (ref 7–25)
CHLORIDE: 100 mmol/L (ref 98–110)
CO2: 29 mmol/L (ref 20–31)
Calcium: 9.9 mg/dL (ref 8.6–10.4)
Creat: 0.8 mg/dL (ref 0.60–0.93)
GFR, Est African American: 81 mL/min (ref >=60)
GFR, Est Non African American: 70 mL/min (ref >=60)
GLUCOSE: 92 mg/dL (ref 70–99)
POTASSIUM: 4 mmol/L (ref 3.5–5.3)
SODIUM: 138 mmol/L (ref 135–146)
Total Bilirubin: 0.6 mg/dL (ref 0.2–1.2)
Total Protein: 6.7 g/dL (ref 6.1–8.1)

## 2016-02-26 LAB — LIPID PANEL
CHOL/HDL RATIO: 3 ratio (ref <5.0)
Cholesterol: 224 mg/dL — ABNORMAL HIGH (ref <200)
HDL: 75 mg/dL (ref >50)
LDL CALC: 131 mg/dL — AB (ref <100)
Triglycerides: 90 mg/dL (ref <150)
VLDL: 18 mg/dL (ref <30)

## 2016-02-26 LAB — CBC WITH DIFFERENTIAL/PLATELET
Basophils Absolute: 59 cells/uL (ref 0–200)
Basophils Relative: 1 %
EOS PCT: 3 %
Eosinophils Absolute: 177 cells/uL (ref 15–500)
HEMATOCRIT: 37.5 % (ref 35.0–45.0)
Hemoglobin: 12.4 g/dL (ref 12.0–15.0)
LYMPHS PCT: 45 %
Lymphs Abs: 2655 cells/uL (ref 850–3900)
MCH: 30.1 pg (ref 27.0–33.0)
MCHC: 33.1 g/dL (ref 32.0–36.0)
MCV: 91 fL (ref 80.0–100.0)
MPV: 10.1 fL (ref 7.5–12.5)
Monocytes Absolute: 590 cells/uL (ref 200–950)
Monocytes Relative: 10 %
NEUTROS PCT: 41 %
Neutro Abs: 2419 cells/uL (ref 1500–7800)
Platelets: 205 10*3/uL (ref 140–400)
RBC: 4.12 MIL/uL (ref 3.80–5.10)
RDW: 13.6 % (ref 11.0–15.0)
WBC: 5.9 10*3/uL (ref 3.8–10.8)

## 2016-02-26 LAB — TSH: TSH: 2.1 mIU/L

## 2016-02-29 ENCOUNTER — Encounter: Payer: Self-pay | Admitting: Family Medicine

## 2016-02-29 ENCOUNTER — Ambulatory Visit (INDEPENDENT_AMBULATORY_CARE_PROVIDER_SITE_OTHER): Payer: PPO | Admitting: Family Medicine

## 2016-02-29 VITALS — BP 150/80 | HR 86 | Temp 97.6°F | Resp 14 | Ht 63.0 in | Wt 115.0 lb

## 2016-02-29 DIAGNOSIS — G8929 Other chronic pain: Secondary | ICD-10-CM | POA: Diagnosis not present

## 2016-02-29 DIAGNOSIS — M545 Low back pain, unspecified: Secondary | ICD-10-CM

## 2016-02-29 DIAGNOSIS — M546 Pain in thoracic spine: Secondary | ICD-10-CM

## 2016-02-29 DIAGNOSIS — Z0000 Encounter for general adult medical examination without abnormal findings: Secondary | ICD-10-CM | POA: Diagnosis not present

## 2016-02-29 DIAGNOSIS — M4013 Other secondary kyphosis, cervicothoracic region: Secondary | ICD-10-CM

## 2016-02-29 DIAGNOSIS — I10 Essential (primary) hypertension: Secondary | ICD-10-CM

## 2016-02-29 MED ORDER — POTASSIUM CHLORIDE ER 10 MEQ PO TBCR: 10.0000 meq | EXTENDED_RELEASE_TABLET | Freq: Every day | ORAL | 3 refills | Status: DC

## 2016-02-29 MED ORDER — CLONAZEPAM 0.5 MG PO TABS: 0.5000 mg | ORAL_TABLET | Freq: Three times a day (TID) | ORAL | 2 refills | Status: DC | PRN

## 2016-02-29 NOTE — Progress Notes (Signed)
Subjective:    Patient ID: Brittany Levy, female    DOB: May 03, 1934, 82 y.o.   MRN: [id number]  HPI  Patient is here today for complete physical exam. She continues to have episodic pain in her lower back and in the middle of her back. She's developed kyphosis. She is due for a bone density test. The pain in her back is worse with prolonged standing and walking. It feels better with rest. She denies any lumbar radiculopathy. She denies any saddle anesthesia or other symptoms of cauda equina syndrome. She is due for mammogram but the patient will schedule this on her own. Due to her age, she does not require a Pap smear. She also does not require colonoscopy. Her most recent lab work as listed below. Her blood pressure today is elevated but she has a well-documented history of white coat syndrome. Her blood pressure at home is much better Appointment on 02/26/2016  Component Date Value Ref Range Status  . Sodium 02/26/2016 138  135 - 146 mmol/L Final  . Potassium 02/26/2016 4.0  3.5 - 5.3 mmol/L Final  . Chloride 02/26/2016 100  98 - 110 mmol/L Final  . CO2 02/26/2016 29  20 - 31 mmol/L Final  . Glucose, Bld 02/26/2016 92  70 - 99 mg/dL Final  . BUN 02/26/2016 17  7 - 25 mg/dL Final  . Creat 02/26/2016 0.80  0.60 - 0.93 mg/dL Final   Comment:   For patients > or = 80 years of age: The upper reference limit for Creatinine is approximately 13% higher for people identified as African-American.     . Total Bilirubin 02/26/2016 0.6  0.2 - 1.2 mg/dL Final  . Alkaline Phosphatase 02/26/2016 58  33 - 130 U/L Final  . AST 02/26/2016 11  10 - 35 U/L Final  . ALT 02/26/2016 10  6 - 29 U/L Final  . Total Protein 02/26/2016 6.7  6.1 - 8.1 g/dL Final  . Albumin 02/26/2016 3.9  3.6 - 5.1 g/dL Final  . Calcium 02/26/2016 9.9  8.6 - 10.4 mg/dL Final  . GFR, Est African American 02/26/2016 81  >=60 mL/min Final  . GFR, Est Non African American 02/26/2016 70  >=60 mL/min Final  . TSH 02/26/2016 2.10   mIU/L Final   Comment:   Reference Range   > or = 20 Years  0.40-4.50   Pregnancy Range First trimester  0.26-2.66 Second trimester 0.55-2.73 Third trimester  0.43-2.91     . Cholesterol 02/26/2016 224* <200 mg/dL Final  . Triglycerides 02/26/2016 90  <150 mg/dL Final  . HDL 02/26/2016 75  >50 mg/dL Final  . Total CHOL/HDL Ratio 02/26/2016 3.0  <5.0 Ratio Final  . VLDL 02/26/2016 18  <30 mg/dL Final  . LDL Cholesterol 02/26/2016 131* <100 mg/dL Final  . WBC 02/26/2016 5.9  3.8 - 10.8 K/uL Final  . RBC 02/26/2016 4.12  3.80 - 5.10 MIL/uL Final  . Hemoglobin 02/26/2016 12.4  12.0 - 15.0 g/dL Final  . HCT 02/26/2016 37.5  35.0 - 45.0 % Final  . MCV 02/26/2016 91.0  80.0 - 100.0 fL Final  . MCH 02/26/2016 30.1  27.0 - 33.0 pg Final  . MCHC 02/26/2016 33.1  32.0 - 36.0 g/dL Final  . RDW 02/26/2016 13.6  11.0 - 15.0 % Final  . Platelets 02/26/2016 205  140 - 400 K/uL Final  . MPV 02/26/2016 10.1  7.5 - 12.5 fL Final  . Neutro Abs 02/26/2016 2419  1,500 -  7,800 cells/uL Final  . Lymphs Abs 02/26/2016 2655  850 - 3,900 cells/uL Final  . Monocytes Absolute 02/26/2016 590  200 - 950 cells/uL Final  . Eosinophils Absolute 02/26/2016 177  15 - 500 cells/uL Final  . Basophils Absolute 02/26/2016 59  0 - 200 cells/uL Final  . Neutrophils Relative % 02/26/2016 41  % Final  . Lymphocytes Relative 02/26/2016 45  % Final  . Monocytes Relative 02/26/2016 10  % Final  . Eosinophils Relative 02/26/2016 3  % Final  . Basophils Relative 02/26/2016 1  % Final  . Smear Review 02/26/2016 Criteria for review not met   Final     Past Medical History:  Diagnosis Date  . Chest pain   . GERD (gastroesophageal reflux disease)   . Hypertension   . Insomnia    Past Surgical History:  Procedure Laterality Date  . ABDOMINAL HYSTERECTOMY     Current Outpatient Prescriptions on File Prior to Visit  Medication Sig Dispense Refill  . albuterol (PROVENTIL HFA;VENTOLIN HFA) 108 (90 Base) MCG/ACT inhaler  Inhale 2 puffs into the lungs every 6 (six) hours as needed for wheezing or shortness of breath. 1 Inhaler 0  . aspirin 81 MG tablet Take 81 mg by mouth daily.    . calcium-vitamin D (OSCAL WITH D) 250-125 MG-UNIT per tablet Take 1 tablet by mouth 2 (two) times daily.    . carvedilol (COREG) 6.25 MG tablet TAKE 1 TABLET BY MOUTH TWICE A DAY WITH A MEAL 180 tablet 0  . clonazePAM (KLONOPIN) 0.5 MG tablet Take 1 tablet (0.5 mg total) by mouth every 8 (eight) hours as needed for anxiety. 60 tablet 2  . cloNIDine (CATAPRES - DOSED IN MG/24 HR) 0.3 mg/24hr patch PLACE 1 PATCH ON THE SKIN ONCE WEEKLY ASDIRECTED 16 patch 4  . felodipine (PLENDIL) 5 MG 24 hr tablet TAKE 1 TABLET BY MOUTH ONCE A DAY 90 tablet 4  . fluticasone (FLONASE) 50 MCG/ACT nasal spray Place 2 sprays into both nostrils daily. 16 g 6  . hydrALAZINE (APRESOLINE) 25 MG tablet TAKE 1 TABLET BY MOUTH TWICE A DAY 180 tablet 4  . hydrochlorothiazide (HYDRODIURIL) 25 MG tablet TAKE 1 TABLET BY MOUTH ONCE A DAY 180 tablet 1  . irbesartan (AVAPRO) 300 MG tablet TAKE 1 TABLET BY MOUTH ONCE A DAY 90 tablet 3  . Omega-3 Fatty Acids (FISH OIL) 1200 MG CAPS Take 2 capsules by mouth.     Marland Kitchen omeprazole (PRILOSEC) 40 MG capsule TAKE 1 CAPSULE BY MOUTH ONCE DAILY 90 capsule 3   No current facility-administered medications on file prior to visit.    No Known Allergies Social History   Social History  . Marital status: Married    Spouse name: N/A  . Number of children: N/A  . Years of education: N/A   Occupational History  . Not on file.   Social History Main Topics  . Smoking status: Never Smoker  . Smokeless tobacco: Never Used  . Alcohol use Yes     Comment: wine occasionally  . Drug use: No  . Sexual activity: Not on file   Other Topics Concern  . Not on file   Social History Narrative  . No narrative on file     Review of Systems  All other systems reviewed and are negative.      Objective:   Physical Exam    Constitutional: She is oriented to person, place, and time. She appears well-developed and well-nourished. No distress.  HENT:  Head: Normocephalic and atraumatic.  Right Ear: External ear normal.  Left Ear: External ear normal.  Nose: Nose normal.  Mouth/Throat: Oropharynx is clear and moist. No oropharyngeal exudate.  Eyes: Conjunctivae and EOM are normal. Pupils are equal, round, and reactive to light. Right eye exhibits no discharge. Left eye exhibits no discharge. No scleral icterus.  Neck: Normal range of motion. Neck supple. No JVD present. No tracheal deviation present. No thyromegaly present.  Cardiovascular: Normal rate, regular rhythm and normal heart sounds.  Exam reveals no gallop and no friction rub.   No murmur heard. Pulmonary/Chest: Effort normal and breath sounds normal. No stridor. No respiratory distress. She has no wheezes. She has no rales. She exhibits no tenderness.  Abdominal: Soft. Bowel sounds are normal. She exhibits no distension and no mass. There is no tenderness. There is no rebound and no guarding.  Musculoskeletal: She exhibits no edema.       Cervical back: She exhibits deformity.       Thoracic back: She exhibits decreased range of motion and tenderness.       Lumbar back: She exhibits decreased range of motion and tenderness.  Lymphadenopathy:    She has no cervical adenopathy.  Neurological: She is alert and oriented to person, place, and time. She has normal reflexes. She displays normal reflexes. No cranial nerve deficit. She exhibits normal muscle tone. Coordination normal.  Skin: Skin is warm. No rash noted. She is not diaphoretic. No erythema. No pallor.  Psychiatric: She has a normal mood and affect. Her behavior is normal. Judgment and thought content normal.  Vitals reviewed.  kyphosis       Assessment & Plan:  Routine general medical examination at a health care facility  Benign essential HTN  Lab work is excellent. Blood pressures  the patient are getting home are better. Schedule patient for a bone density test. She will schedule her mammogram. I believe the majority of the patient's having in her mid and lower back is muscular in nature and is exacerbated by her kyphosis. I have recommended that we get x-rays of the lumbar and thoracic spine to evaluate further. Do not recommend a colonoscopy or a Pap smear based on her age.

## 2016-03-04 ENCOUNTER — Other Ambulatory Visit: Payer: Self-pay | Admitting: Family Medicine

## 2016-03-05 ENCOUNTER — Ambulatory Visit
Admission: RE | Admit: 2016-03-05 | Discharge: 2016-03-05 | Disposition: A | Discharge status: Home / Self Care | Payer: PPO | Source: Ambulatory Visit | Attending: Family Medicine | Admitting: Family Medicine

## 2016-03-05 DIAGNOSIS — M546 Pain in thoracic spine: Principal | ICD-10-CM

## 2016-03-05 DIAGNOSIS — M545 Low back pain, unspecified: Secondary | ICD-10-CM

## 2016-03-05 DIAGNOSIS — G8929 Other chronic pain: Secondary | ICD-10-CM

## 2016-03-07 ENCOUNTER — Encounter: Payer: Self-pay | Admitting: Family Medicine

## 2016-03-11 ENCOUNTER — Other Ambulatory Visit: Payer: Self-pay | Admitting: Family Medicine

## 2016-03-11 ENCOUNTER — Encounter: Payer: Self-pay | Admitting: Family Medicine

## 2016-03-11 DIAGNOSIS — Z780 Asymptomatic menopausal state: Secondary | ICD-10-CM

## 2016-03-11 DIAGNOSIS — Z1231 Encounter for screening mammogram for malignant neoplasm of breast: Secondary | ICD-10-CM

## 2016-03-27 DIAGNOSIS — H52222 Regular astigmatism, left eye: Secondary | ICD-10-CM | POA: Diagnosis not present

## 2016-03-27 DIAGNOSIS — H40013 Open angle with borderline findings, low risk, bilateral: Secondary | ICD-10-CM | POA: Diagnosis not present

## 2016-03-27 DIAGNOSIS — H40053 Ocular hypertension, bilateral: Secondary | ICD-10-CM | POA: Diagnosis not present

## 2016-03-27 DIAGNOSIS — H5203 Hypermetropia, bilateral: Secondary | ICD-10-CM | POA: Diagnosis not present

## 2016-03-27 DIAGNOSIS — Z961 Presence of intraocular lens: Secondary | ICD-10-CM | POA: Diagnosis not present

## 2016-03-27 DIAGNOSIS — H40059 Ocular hypertension, unspecified eye: Secondary | ICD-10-CM | POA: Diagnosis not present

## 2016-04-15 ENCOUNTER — Other Ambulatory Visit: Payer: Self-pay | Admitting: Family Medicine

## 2016-04-16 ENCOUNTER — Ambulatory Visit
Admission: RE | Admit: 2016-04-16 | Discharge: 2016-04-16 | Disposition: A | Discharge status: Home / Self Care | Payer: PPO | Source: Ambulatory Visit | Attending: Family Medicine | Admitting: Family Medicine

## 2016-04-16 ENCOUNTER — Encounter: Payer: Self-pay | Admitting: Family Medicine

## 2016-04-16 DIAGNOSIS — Z1231 Encounter for screening mammogram for malignant neoplasm of breast: Secondary | ICD-10-CM | POA: Insufficient documentation

## 2016-04-16 DIAGNOSIS — M4013 Other secondary kyphosis, cervicothoracic region: Secondary | ICD-10-CM | POA: Diagnosis not present

## 2016-04-16 DIAGNOSIS — M810 Age-related osteoporosis without current pathological fracture: Secondary | ICD-10-CM | POA: Insufficient documentation

## 2016-04-17 ENCOUNTER — Other Ambulatory Visit: Payer: Self-pay | Admitting: Family Medicine

## 2016-04-17 DIAGNOSIS — R928 Other abnormal and inconclusive findings on diagnostic imaging of breast: Secondary | ICD-10-CM

## 2016-04-17 DIAGNOSIS — N6489 Other specified disorders of breast: Secondary | ICD-10-CM

## 2016-04-18 ENCOUNTER — Encounter: Payer: Self-pay | Admitting: Family Medicine

## 2016-04-19 ENCOUNTER — Encounter: Payer: Self-pay | Admitting: Family Medicine

## 2016-04-25 ENCOUNTER — Ambulatory Visit
Admission: RE | Admit: 2016-04-25 | Discharge: 2016-04-25 | Disposition: A | Discharge status: Home / Self Care | Payer: PPO | Source: Ambulatory Visit | Attending: Family Medicine | Admitting: Family Medicine

## 2016-04-25 DIAGNOSIS — N6489 Other specified disorders of breast: Secondary | ICD-10-CM

## 2016-04-25 DIAGNOSIS — R928 Other abnormal and inconclusive findings on diagnostic imaging of breast: Secondary | ICD-10-CM | POA: Diagnosis not present

## 2016-04-25 DIAGNOSIS — R922 Inconclusive mammogram: Secondary | ICD-10-CM | POA: Diagnosis not present

## 2016-04-25 DIAGNOSIS — N6311 Unspecified lump in the right breast, upper outer quadrant: Secondary | ICD-10-CM | POA: Diagnosis not present

## 2016-04-26 ENCOUNTER — Other Ambulatory Visit: Payer: Self-pay | Admitting: Family Medicine

## 2016-04-26 DIAGNOSIS — R928 Other abnormal and inconclusive findings on diagnostic imaging of breast: Secondary | ICD-10-CM

## 2016-04-26 DIAGNOSIS — N6310 Unspecified lump in the right breast, unspecified quadrant: Secondary | ICD-10-CM | POA: Diagnosis not present

## 2016-05-07 ENCOUNTER — Ambulatory Visit
Admission: RE | Admit: 2016-05-07 | Discharge: 2016-05-07 | Disposition: A | Discharge status: Home / Self Care | Payer: PPO | Source: Ambulatory Visit | Attending: Family Medicine | Admitting: Family Medicine

## 2016-05-07 DIAGNOSIS — R928 Other abnormal and inconclusive findings on diagnostic imaging of breast: Secondary | ICD-10-CM

## 2016-05-07 DIAGNOSIS — N6310 Unspecified lump in the right breast, unspecified quadrant: Secondary | ICD-10-CM | POA: Diagnosis not present

## 2016-05-07 HISTORY — PX: BREAST BIOPSY: SHX20

## 2016-05-08 LAB — SURGICAL PATHOLOGY

## 2016-05-13 ENCOUNTER — Other Ambulatory Visit: Payer: Self-pay | Admitting: Family Medicine

## 2016-05-13 NOTE — Telephone Encounter (Signed)
Medication refilled per protocol. 

## 2016-05-16 ENCOUNTER — Telehealth: Payer: Self-pay | Admitting: Family Medicine

## 2016-05-16 NOTE — Telephone Encounter (Signed)
Pt calling to check status of Prolia as she has not heard anything about it yet. I sent staff message on 04/23/16 to get her started on this. Can you please contact pt to let her know what is the next step for her. She is active on mychart and said you could just send her a message that way.

## 2016-06-17 ENCOUNTER — Other Ambulatory Visit: Payer: Self-pay | Admitting: Family Medicine

## 2016-06-24 ENCOUNTER — Other Ambulatory Visit: Payer: Self-pay | Admitting: Family Medicine

## 2016-06-25 ENCOUNTER — Encounter: Payer: Self-pay | Admitting: Family Medicine

## 2016-06-25 ENCOUNTER — Ambulatory Visit (INDEPENDENT_AMBULATORY_CARE_PROVIDER_SITE_OTHER): Payer: PPO | Admitting: Family Medicine

## 2016-06-25 VITALS — BP 166/80 | HR 86 | Temp 98.0°F | Resp 14 | Ht 63.0 in | Wt 119.0 lb

## 2016-06-25 DIAGNOSIS — M810 Age-related osteoporosis without current pathological fracture: Secondary | ICD-10-CM | POA: Insufficient documentation

## 2016-06-25 DIAGNOSIS — M8080XD Other osteoporosis with current pathological fracture, unspecified site, subsequent encounter for fracture with routine healing: Secondary | ICD-10-CM

## 2016-06-25 MED ORDER — DENOSUMAB 60 MG/ML ~~LOC~~ SOLN
60.0000 mg | Freq: Once | SUBCUTANEOUS | Status: AC
  Administered 2016-06-25: 60 mg via SUBCUTANEOUS

## 2016-06-25 NOTE — Progress Notes (Signed)
Subjective:    Patient ID: Brittany Levy, female    DOB: 1934/10/05, 82 y.o.   MRN: [id number]  HPI In March, I performed a DEXA which revealed a T score of -2.7 in the left femur. At that time, I recommended the patient start Fosamax 70 mg a week for osteoporosis. However the patient is very leery of taking Fosamax due to research she has done online. Afterwards, we obtain an x-ray of her back due to low back pain that she is having. Although it did show degenerative disc disease at L4-L5 it also showed a coincidental finding of a compression fracture at T11. I have recommended that the patient try prolia injections to manage her osteoporosis. She is yet to begin these because she is very concerned about possible side effects. She is here today to review her bone density results as well as her lumbar spine films Past Medical History:  Diagnosis Date  . Chest pain   . GERD (gastroesophageal reflux disease)   . Hypertension   . Insomnia   . Osteoporosis of femur without pathological fracture    Past Surgical History:  Procedure Laterality Date  . ABDOMINAL HYSTERECTOMY     Current Outpatient Prescriptions on File Prior to Visit  Medication Sig Dispense Refill  . albuterol (PROVENTIL HFA;VENTOLIN HFA) 108 (90 Base) MCG/ACT inhaler Inhale 2 puffs into the lungs every 6 (six) hours as needed for wheezing or shortness of breath. 1 Inhaler 0  . aspirin 81 MG tablet Take 81 mg by mouth daily.    . Calcium-Magnesium-Vitamin D (CALCIUM 1200+D3 PO) Take by mouth.    . calcium-vitamin D (OSCAL WITH D) 250-125 MG-UNIT per tablet Take 1 tablet by mouth 2 (two) times daily.    . carvedilol (COREG) 6.25 MG tablet TAKE 1 TABLET BY MOUTH TWICE A DAY WITH A MEAL. 180 tablet 1  . clonazePAM (KLONOPIN) 0.5 MG tablet Take 1 tablet (0.5 mg total) by mouth every 8 (eight) hours as needed for anxiety. 60 tablet 2  . cloNIDine (CATAPRES - DOSED IN MG/24 HR) 0.3 mg/24hr patch PLACE 1 PATCH ON THE SKIN ONCE A  WEEK ASDIRECTED 16 patch 3  . felodipine (PLENDIL) 5 MG 24 hr tablet TAKE 1 TABLET BY MOUTH ONCE A DAY 90 tablet 3  . fluticasone (FLONASE) 50 MCG/ACT nasal spray Place 2 sprays into both nostrils daily. 16 g 6  . hydrALAZINE (APRESOLINE) 25 MG tablet TAKE 1 TABLET BY MOUTH TWICE A DAY 180 tablet 1  . hydrochlorothiazide (HYDRODIURIL) 25 MG tablet TAKE 1 TABLET BY MOUTH ONCE A DAY 180 tablet 1  . irbesartan (AVAPRO) 300 MG tablet TAKE 1 TABLET BY MOUTH ONCE A DAY 90 tablet 3  . Omega-3 Fatty Acids (FISH OIL) 1200 MG CAPS Take 2 capsules by mouth.     Marland Kitchen omeprazole (PRILOSEC) 40 MG capsule TAKE 1 CAPSULE BY MOUTH ONCE DAILY 90 capsule 3  . potassium chloride (K-DUR) 10 MEQ tablet Take 1 tablet (10 mEq total) by mouth daily. 90 tablet 3   No current facility-administered medications on file prior to visit.    No Known Allergies Social History   Social History  . Marital status: Married    Spouse name: N/A  . Number of children: N/A  . Years of education: N/A   Occupational History  . Not on file.   Social History Main Topics  . Smoking status: Never Smoker  . Smokeless tobacco: Never Used  . Alcohol use Yes  Comment: wine occasionally  . Drug use: No  . Sexual activity: Not on file   Other Topics Concern  . Not on file   Social History Narrative  . No narrative on file      Review of Systems  All other systems reviewed and are negative.      Objective:   Physical Exam  Cardiovascular: Normal rate, regular rhythm and normal heart sounds.   Pulmonary/Chest: Effort normal and breath sounds normal. No respiratory distress. She has no wheezes. She has no rales.  Musculoskeletal:       Lumbar back: She exhibits decreased range of motion and pain. She exhibits no tenderness, no bony tenderness, no deformity and no spasm.  Vitals reviewed.         Assessment & Plan:  Localized osteoporosis with current pathological fracture with routine healing, subsequent  encounter  Spent more than 20 minutes with the patient reviewing her x-rays as well as her bone density test results. I explained that she is at high risk for osteoporotic fracture of the future probably greater than 20%. In particular, I explained that she is at high risk for future hip fracture. She is already suffered a vertebral fracture without cause. I recommended some form of treatment. We discussed the risk and benefits of all of her options and ultimately, the patient elected to proceed with prolia.

## 2016-06-25 NOTE — Addendum Note (Signed)
Addended by: Legrand RamsWILLIS, SANDY B on: 06/25/2016 04:31 PM   Modules accepted: Orders

## 2016-09-17 ENCOUNTER — Other Ambulatory Visit: Payer: Self-pay | Admitting: Family Medicine

## 2016-09-27 ENCOUNTER — Telehealth: Payer: Self-pay | Admitting: Family Medicine

## 2016-09-27 NOTE — Telephone Encounter (Signed)
Pt needs refill on clonazepam sent to [facility] in [place].

## 2016-09-27 NOTE — Telephone Encounter (Signed)
Ok to refill 

## 2016-09-30 ENCOUNTER — Other Ambulatory Visit: Payer: Self-pay | Admitting: Family Medicine

## 2016-09-30 MED ORDER — CLONAZEPAM 0.5 MG PO TABS: 0.5000 mg | ORAL_TABLET | Freq: Three times a day (TID) | ORAL | 2 refills | Status: DC | PRN

## 2016-09-30 NOTE — Telephone Encounter (Signed)
ok 

## 2016-09-30 NOTE — Telephone Encounter (Signed)
Medication called/sent to requested pharmacy  

## 2016-10-08 ENCOUNTER — Other Ambulatory Visit: Payer: Self-pay | Admitting: Family Medicine

## 2016-10-09 ENCOUNTER — Ambulatory Visit (INDEPENDENT_AMBULATORY_CARE_PROVIDER_SITE_OTHER): Payer: PPO | Admitting: *Deleted

## 2016-10-09 DIAGNOSIS — Z23 Encounter for immunization: Secondary | ICD-10-CM

## 2016-10-09 NOTE — Progress Notes (Signed)
Patient seen in office for Influenza Vaccination.   Tolerated IM administration well.   Immunization history updated.  

## 2016-12-16 ENCOUNTER — Encounter: Payer: Self-pay | Admitting: Family Medicine

## 2016-12-31 ENCOUNTER — Ambulatory Visit (INDEPENDENT_AMBULATORY_CARE_PROVIDER_SITE_OTHER): Payer: PPO | Admitting: Family Medicine

## 2016-12-31 DIAGNOSIS — M810 Age-related osteoporosis without current pathological fracture: Secondary | ICD-10-CM

## 2016-12-31 MED ORDER — DENOSUMAB 60 MG/ML ~~LOC~~ SOLN
60.0000 mg | Freq: Once | SUBCUTANEOUS | Status: AC
  Administered 2016-12-31: 60 mg via SUBCUTANEOUS

## 2017-01-16 ENCOUNTER — Other Ambulatory Visit: Payer: Self-pay | Admitting: Family Medicine

## 2017-02-18 ENCOUNTER — Other Ambulatory Visit: Payer: Self-pay | Admitting: Family Medicine

## 2017-04-22 ENCOUNTER — Encounter: Payer: PPO | Admitting: Family Medicine

## 2017-04-23 ENCOUNTER — Other Ambulatory Visit: Payer: PPO

## 2017-04-23 DIAGNOSIS — Z0000 Encounter for general adult medical examination without abnormal findings: Secondary | ICD-10-CM

## 2017-04-23 DIAGNOSIS — I10 Essential (primary) hypertension: Secondary | ICD-10-CM | POA: Diagnosis not present

## 2017-04-23 LAB — COMPREHENSIVE METABOLIC PANEL
AG Ratio: 1.4 (calc) (ref 1.0–2.5)
ALKALINE PHOSPHATASE (APISO): 49 U/L (ref 33–130)
ALT: 13 U/L (ref 6–29)
AST: 13 U/L (ref 10–35)
Albumin: 4 g/dL (ref 3.6–5.1)
BILIRUBIN TOTAL: 0.5 mg/dL (ref 0.2–1.2)
BUN/Creatinine Ratio: 20 (calc) (ref 6–22)
BUN: 20 mg/dL (ref 7–25)
CALCIUM: 9.9 mg/dL (ref 8.6–10.4)
CHLORIDE: 99 mmol/L (ref 98–110)
CO2: 34 mmol/L — ABNORMAL HIGH (ref 20–32)
CREATININE: 1 mg/dL — AB (ref 0.60–0.88)
GLOBULIN: 2.8 g/dL (ref 1.9–3.7)
Glucose, Bld: 101 mg/dL — ABNORMAL HIGH (ref 65–99)
Potassium: 4.3 mmol/L (ref 3.5–5.3)
Sodium: 138 mmol/L (ref 135–146)
Total Protein: 6.8 g/dL (ref 6.1–8.1)

## 2017-04-23 LAB — CBC WITH DIFFERENTIAL/PLATELET
BASOS PCT: 0.6 %
Basophils Absolute: 41 cells/uL (ref 0–200)
EOS ABS: 138 {cells}/uL (ref 15–500)
Eosinophils Relative: 2 %
HEMATOCRIT: 37.2 % (ref 35.0–45.0)
Hemoglobin: 12.4 g/dL (ref 11.7–15.5)
Lymphs Abs: 2912 cells/uL (ref 850–3900)
MCH: 29.1 pg (ref 27.0–33.0)
MCHC: 33.3 g/dL (ref 32.0–36.0)
MCV: 87.3 fL (ref 80.0–100.0)
MPV: 10.9 fL (ref 7.5–12.5)
Monocytes Relative: 8.7 %
Neutro Abs: 3209 cells/uL (ref 1500–7800)
Neutrophils Relative %: 46.5 %
PLATELETS: 213 10*3/uL (ref 140–400)
RBC: 4.26 10*6/uL (ref 3.80–5.10)
RDW: 12.6 % (ref 11.0–15.0)
TOTAL LYMPHOCYTE: 42.2 %
WBC: 6.9 10*3/uL (ref 3.8–10.8)
WBCMIX: 600 {cells}/uL (ref 200–950)

## 2017-04-23 LAB — LIPID PANEL
CHOL/HDL RATIO: 2.8 (calc) (ref <5.0)
CHOLESTEROL: 216 mg/dL — AB (ref <200)
HDL: 77 mg/dL (ref >50)
LDL Cholesterol (Calc): 119 mg/dL (calc) — ABNORMAL HIGH
Non-HDL Cholesterol (Calc): 139 mg/dL (calc) — ABNORMAL HIGH (ref <130)
Triglycerides: 98 mg/dL (ref <150)

## 2017-04-25 ENCOUNTER — Ambulatory Visit (INDEPENDENT_AMBULATORY_CARE_PROVIDER_SITE_OTHER): Payer: PPO | Admitting: Family Medicine

## 2017-04-25 ENCOUNTER — Encounter: Payer: Self-pay | Admitting: Family Medicine

## 2017-04-25 ENCOUNTER — Other Ambulatory Visit: Payer: Self-pay | Admitting: Family Medicine

## 2017-04-25 VITALS — BP 140/68 | HR 80 | Temp 97.5°F | Resp 14 | Ht 63.0 in | Wt 121.0 lb

## 2017-04-25 DIAGNOSIS — M4013 Other secondary kyphosis, cervicothoracic region: Secondary | ICD-10-CM | POA: Diagnosis not present

## 2017-04-25 DIAGNOSIS — M810 Age-related osteoporosis without current pathological fracture: Secondary | ICD-10-CM | POA: Diagnosis not present

## 2017-04-25 DIAGNOSIS — R0609 Other forms of dyspnea: Secondary | ICD-10-CM

## 2017-04-25 DIAGNOSIS — I10 Essential (primary) hypertension: Secondary | ICD-10-CM | POA: Diagnosis not present

## 2017-04-25 DIAGNOSIS — Z0000 Encounter for general adult medical examination without abnormal findings: Secondary | ICD-10-CM

## 2017-04-25 MED ORDER — DENOSUMAB 60 MG/ML ~~LOC~~ SOLN: 60.0000 mg | Freq: Once | SUBCUTANEOUS | 0 refills | Status: AC

## 2017-04-25 NOTE — Progress Notes (Signed)
Subjective:    Patient ID: Brittany Levy, female    DOB: [birth year], 83 y.o.   MRN: [id number]  HPI  Patient is here today for complete physical exam.  During the review of systems, the patient endorses some shortness of breath with exertion recently.  She states that she was walking up an incline with 3 of her friends to go to a restaurant when she felt short of breath.  It took approximately 15 minutes for her to catch her breath.  She denies any chest pain during that event.  She has been relatively sedentary this winter but has not had any other episodes of shortness of breath or dyspnea on exertion or chest pain.  Due to her age, she does not require a Pap smear. She also does not require colonoscopy.  Her last mammogram was in March 2018 and is due again.  Her last bone density test was in March 2018 was significant for osteoporosis.  She is currently receiving Prolia along with calcium and vitamin D supplements.  Her most recent lab work as listed below Lab on 04/23/2017  Component Date Value Ref Range Status  . WBC 04/23/2017 6.9  3.8 - 10.8 Thousand/uL Final  . RBC 04/23/2017 4.26  3.80 - 5.10 Million/uL Final  . Hemoglobin 04/23/2017 12.4  11.7 - 15.5 g/dL Final  . HCT 40/98/1191 37.2  35.0 - 45.0 % Final  . MCV 04/23/2017 87.3  80.0 - 100.0 fL Final  . MCH 04/23/2017 29.1  27.0 - 33.0 pg Final  . MCHC 04/23/2017 33.3  32.0 - 36.0 g/dL Final  . RDW 47/82/9562 12.6  11.0 - 15.0 % Final  . Platelets 04/23/2017 213  140 - 400 Thousand/uL Final  . MPV 04/23/2017 10.9  7.5 - 12.5 fL Final  . Neutro Abs 04/23/2017 3,209  1,500 - 7,800 cells/uL Final  . Lymphs Abs 04/23/2017 2,912  850 - 3,900 cells/uL Final  . WBC mixed population 04/23/2017 600  200 - 950 cells/uL Final  . Eosinophils Absolute 04/23/2017 138  15 - 500 cells/uL Final  . Basophils Absolute 04/23/2017 41  0 - 200 cells/uL Final  . Neutrophils Relative % 04/23/2017 46.5  % Final  . Total Lymphocyte 04/23/2017 42.2  %  Final  . Monocytes Relative 04/23/2017 8.7  % Final  . Eosinophils Relative 04/23/2017 2.0  % Final  . Basophils Relative 04/23/2017 0.6  % Final  . Glucose, Bld 04/23/2017 101* 65 - 99 mg/dL Final   Comment: .            Fasting reference interval . For someone without known diabetes, a glucose value between 100 and 125 mg/dL is consistent with prediabetes and should be confirmed with a follow-up test. .   . BUN 04/23/2017 20  7 - 25 mg/dL Final  . Creat 13/09/6576 1.00* 0.60 - 0.88 mg/dL Final   Comment: For patients >65 years of age, the reference limit for Creatinine is approximately 13% higher for people identified as African-American. .   Edwena Felty Ratio 04/23/2017 20  6 - 22 (calc) Final  . Sodium 04/23/2017 138  135 - 146 mmol/L Final  . Potassium 04/23/2017 4.3  3.5 - 5.3 mmol/L Final  . Chloride 04/23/2017 99  98 - 110 mmol/L Final  . CO2 04/23/2017 34* 20 - 32 mmol/L Final  . Calcium 04/23/2017 9.9  8.6 - 10.4 mg/dL Final  . Total Protein 04/23/2017 6.8  6.1 - 8.1 g/dL Final  . Albumin  04/23/2017 4.0  3.6 - 5.1 g/dL Final  . Globulin 16/10/960403/20/2019 2.8  1.9 - 3.7 g/dL (calc) Final  . AG Ratio 04/23/2017 1.4  1.0 - 2.5 (calc) Final  . Total Bilirubin 04/23/2017 0.5  0.2 - 1.2 mg/dL Final  . Alkaline phosphatase (APISO) 04/23/2017 49  33 - 130 U/L Final  . AST 04/23/2017 13  10 - 35 U/L Final  . ALT 04/23/2017 13  6 - 29 U/L Final  . Cholesterol 04/23/2017 216* <200 mg/dL Final  . HDL 54/09/811903/20/2019 77  >50 mg/dL Final  . Triglycerides 04/23/2017 98  <150 mg/dL Final  . LDL Cholesterol (Calc) 04/23/2017 119* mg/dL (calc) Final   Comment: Reference range: <100 . Desirable range <100 mg/dL for primary prevention;   <70 mg/dL for patients with CHD or diabetic patients  with > or = 2 CHD risk factors. Marland Kitchen. LDL-C is now calculated using the Martin-Hopkins  calculation, which is a validated novel method providing  better accuracy than the Friedewald equation in the    estimation of LDL-C.  Horald PollenMartin SS et al. Lenox AhrJAMA. 1478;295(622013;310(19): 2061-2068  (http://education.[facility].com/faq/FAQ164)   . Total CHOL/HDL Ratio 04/23/2017 2.8  <1.3<5.0 (calc) Final  . Non-HDL Cholesterol (Calc) 04/23/2017 139* <130 mg/dL (calc) Final   Comment: For patients with diabetes plus 1 major ASCVD risk  factor, treating to a non-HDL-C goal of <100 mg/dL  (LDL-C of <08<70 mg/dL) is considered a therapeutic  option.      Past Medical History:  Diagnosis Date  . Chest pain   . GERD (gastroesophageal reflux disease)   . Hypertension   . Insomnia   . Osteoporosis of femur without pathological fracture    Past Surgical History:  Procedure Laterality Date  . ABDOMINAL HYSTERECTOMY     Current Outpatient Medications on File Prior to Visit  Medication Sig Dispense Refill  . aspirin 81 MG tablet Take 81 mg by mouth daily.    . Calcium-Magnesium-Vitamin D (CALCIUM 1200+D3 PO) Take by mouth.    . calcium-vitamin D (OSCAL WITH D) 250-125 MG-UNIT per tablet Take 1 tablet by mouth 2 (two) times daily.    . carvedilol (COREG) 6.25 MG tablet TAKE 1 TABLET BY MOUTH TWICE A DAY WITH A MEAL 180 tablet 1  . clonazePAM (KLONOPIN) 0.5 MG tablet TAKE ONE TABLET BY MOUTH EVERY 8 HOURS AS NEEDED FOR ANXIETY 60 tablet 2  . cloNIDine (CATAPRES - DOSED IN MG/24 HR) 0.3 mg/24hr patch PLACE 1 PATCH ON THE SKIN ONCE A WEEK ASDIRECTED 16 patch 3  . felodipine (PLENDIL) 5 MG 24 hr tablet TAKE 1 TABLET BY MOUTH ONCE A DAY 90 tablet 3  . hydrALAZINE (APRESOLINE) 25 MG tablet TAKE 1 TABLET BY MOUTH TWICE A DAY 180 tablet 3  . hydrochlorothiazide (HYDRODIURIL) 25 MG tablet TAKE 1 TABLET BY MOUTH ONCE A DAY 180 tablet 1  . irbesartan (AVAPRO) 300 MG tablet TAKE 1 TABLET BY MOUTH ONCE DAILY 90 tablet 3  . Omega-3 Fatty Acids (FISH OIL) 1200 MG CAPS Take 2 capsules by mouth.     Marland Kitchen. omeprazole (PRILOSEC) 40 MG capsule TAKE 1 CAPSULE BY MOUTH ONCE DAILY 90 capsule 3  . potassium chloride (K-DUR) 10 MEQ tablet  TAKE 1 TABLET BY MOUTH ONCE A DAY 90 tablet 3   No current facility-administered medications on file prior to visit.    No Known Allergies Social History   Socioeconomic History  . Marital status: Married    Spouse name: Not on file  . Number  of children: Not on file  . Years of education: Not on file  . Highest education level: Not on file  Occupational History  . Not on file  Social Needs  . Financial resource strain: Not on file  . Food insecurity:    Worry: Not on file    Inability: Not on file  . Transportation needs:    Medical: Not on file    Non-medical: Not on file  Tobacco Use  . Smoking status: Never Smoker  . Smokeless tobacco: Never Used  Substance and Sexual Activity  . Alcohol use: Yes    Comment: wine occasionally  . Drug use: No  . Sexual activity: Not on file  Lifestyle  . Physical activity:    Days per week: Not on file    Minutes per session: Not on file  . Stress: Not on file  Relationships  . Social connections:    Talks on phone: Not on file    Gets together: Not on file    Attends religious service: Not on file    Active member of club or organization: Not on file    Attends meetings of clubs or organizations: Not on file    Relationship status: Not on file  . Intimate partner violence:    Fear of current or ex partner: Not on file    Emotionally abused: Not on file    Physically abused: Not on file    Forced sexual activity: Not on file  Other Topics Concern  . Not on file  Social History Narrative  . Not on file     Review of Systems  All other systems reviewed and are negative.      Objective:   Physical Exam  Constitutional: She is oriented to person, place, and time. She appears well-developed and well-nourished. No distress.  HENT:  Head: Normocephalic and atraumatic.  Right Ear: External ear normal.  Left Ear: External ear normal.  Nose: Nose normal.  Mouth/Throat: Oropharynx is clear and moist. No oropharyngeal  exudate.  Eyes: Pupils are equal, round, and reactive to light. Conjunctivae and EOM are normal. Right eye exhibits no discharge. Left eye exhibits no discharge. No scleral icterus.  Neck: Normal range of motion. Neck supple. No JVD present. No tracheal deviation present. No thyromegaly present.  Cardiovascular: Normal rate, regular rhythm and normal heart sounds. Exam reveals no gallop and no friction rub.  No murmur heard. Pulmonary/Chest: Effort normal and breath sounds normal. No stridor. No respiratory distress. She has no wheezes. She has no rales. She exhibits no tenderness.  Abdominal: Soft. Bowel sounds are normal. She exhibits no distension and no mass. There is no tenderness. There is no rebound and no guarding.  Musculoskeletal: She exhibits no edema.       Cervical back: She exhibits deformity.       Thoracic back: She exhibits decreased range of motion and tenderness.       Lumbar back: She exhibits decreased range of motion and tenderness.  Lymphadenopathy:    She has no cervical adenopathy.  Neurological: She is alert and oriented to person, place, and time. She has normal reflexes. No cranial nerve deficit. She exhibits normal muscle tone. Coordination normal.  Skin: Skin is warm. No rash noted. She is not diaphoretic. No erythema. No pallor.  Psychiatric: She has a normal mood and affect. Her behavior is normal. Judgment and thought content normal.  Vitals reviewed.  kyphosis       Assessment & Plan:  Dyspnea on exertion - Plan: EKG 12-Lead  Routine general medical examination at a health care facility  Essential hypertension  Other secondary kyphosis, cervicothoracic region  Osteoporosis, unspecified osteoporosis type, unspecified pathological fracture presence  Lab work is excellent.  Her blood pressure is excellent today especially for this patient.  She will schedule her mammogram.  Pap smears and colonoscopies are no longer recommended.  She is currently under  therapy for osteoporosis and is due for her next Prolia injection in May.  I will obtain an EKG today given her recent episode of shortness of breath with activity.  EKG today does show normal sinus rhythm with normal axis however she does have a right bundle branch block that is new when compared to her EKG from 2014.  However this is one isolated incident with no chest pain.  At the present time we will clinically monitor the patient, if she continues to have dyspnea on exertion, I would recommend cardiology consultation for echocardiogram and possible stress testing.  If it does not reoccur, no further workup is needed at this time.  This particular event occurred approximately 2-3 weeks ago and has not happened since.

## 2017-05-12 ENCOUNTER — Other Ambulatory Visit: Payer: Self-pay | Admitting: Family Medicine

## 2017-06-05 DIAGNOSIS — Z961 Presence of intraocular lens: Secondary | ICD-10-CM | POA: Diagnosis not present

## 2017-06-05 DIAGNOSIS — H1045 Other chronic allergic conjunctivitis: Secondary | ICD-10-CM | POA: Diagnosis not present

## 2017-06-05 DIAGNOSIS — H3540 Unspecified peripheral retinal degeneration: Secondary | ICD-10-CM | POA: Diagnosis not present

## 2017-06-16 ENCOUNTER — Other Ambulatory Visit: Payer: Self-pay | Admitting: Family Medicine

## 2017-07-08 ENCOUNTER — Other Ambulatory Visit: Payer: Self-pay | Admitting: Family Medicine

## 2017-07-08 ENCOUNTER — Ambulatory Visit (INDEPENDENT_AMBULATORY_CARE_PROVIDER_SITE_OTHER): Payer: PPO | Admitting: Family Medicine

## 2017-07-08 DIAGNOSIS — M810 Age-related osteoporosis without current pathological fracture: Secondary | ICD-10-CM

## 2017-07-08 MED ORDER — CLONAZEPAM 0.5 MG PO TABS: 0.5000 mg | ORAL_TABLET | Freq: Three times a day (TID) | ORAL | 1 refills | Status: DC | PRN

## 2017-07-08 MED ORDER — DENOSUMAB 60 MG/ML ~~LOC~~ SOSY
60.0000 mg | PREFILLED_SYRINGE | SUBCUTANEOUS | Status: DC
  Administered 2017-07-08 – 2022-01-09 (×8): 60 mg via SUBCUTANEOUS

## 2017-07-08 NOTE — Telephone Encounter (Signed)
Requesting refill    Klonopin  LOV: 04/25/17  LRF:  09/30/16  (she takes very sparingly)

## 2017-09-02 ENCOUNTER — Encounter: Payer: Self-pay | Admitting: Family Medicine

## 2017-09-02 DIAGNOSIS — Z1231 Encounter for screening mammogram for malignant neoplasm of breast: Secondary | ICD-10-CM

## 2017-09-12 ENCOUNTER — Ambulatory Visit
Admission: RE | Admit: 2017-09-12 | Discharge: 2017-09-12 | Disposition: A | Discharge status: Home / Self Care | Payer: PPO | Source: Ambulatory Visit | Attending: Family Medicine | Admitting: Family Medicine

## 2017-09-12 DIAGNOSIS — Z1231 Encounter for screening mammogram for malignant neoplasm of breast: Secondary | ICD-10-CM

## 2017-09-12 DIAGNOSIS — R922 Inconclusive mammogram: Secondary | ICD-10-CM | POA: Diagnosis not present

## 2017-09-22 ENCOUNTER — Other Ambulatory Visit: Payer: Self-pay | Admitting: Family Medicine

## 2017-11-24 ENCOUNTER — Other Ambulatory Visit: Payer: Self-pay | Admitting: Family Medicine

## 2018-01-13 ENCOUNTER — Ambulatory Visit (INDEPENDENT_AMBULATORY_CARE_PROVIDER_SITE_OTHER): Payer: PPO | Admitting: Family Medicine

## 2018-01-13 DIAGNOSIS — M810 Age-related osteoporosis without current pathological fracture: Secondary | ICD-10-CM

## 2018-02-09 ENCOUNTER — Other Ambulatory Visit: Payer: Self-pay | Admitting: Family Medicine

## 2018-02-23 ENCOUNTER — Other Ambulatory Visit: Payer: Self-pay | Admitting: Family Medicine

## 2018-05-18 ENCOUNTER — Encounter: Payer: Self-pay | Admitting: Family Medicine

## 2018-05-18 ENCOUNTER — Other Ambulatory Visit: Payer: Self-pay | Admitting: Family Medicine

## 2018-05-27 IMAGING — CR DG LUMBAR SPINE COMPLETE 4+V
5 series · 5 of 5 positions shown · non-contrast
Comparison: CT chest of 07/02/2011

CLINICAL DATA: A history of a fall 1 year ago now with low back
pain

EXAM:
LUMBAR SPINE - COMPLETE 4+ VIEW

[w lumbar spine ap]
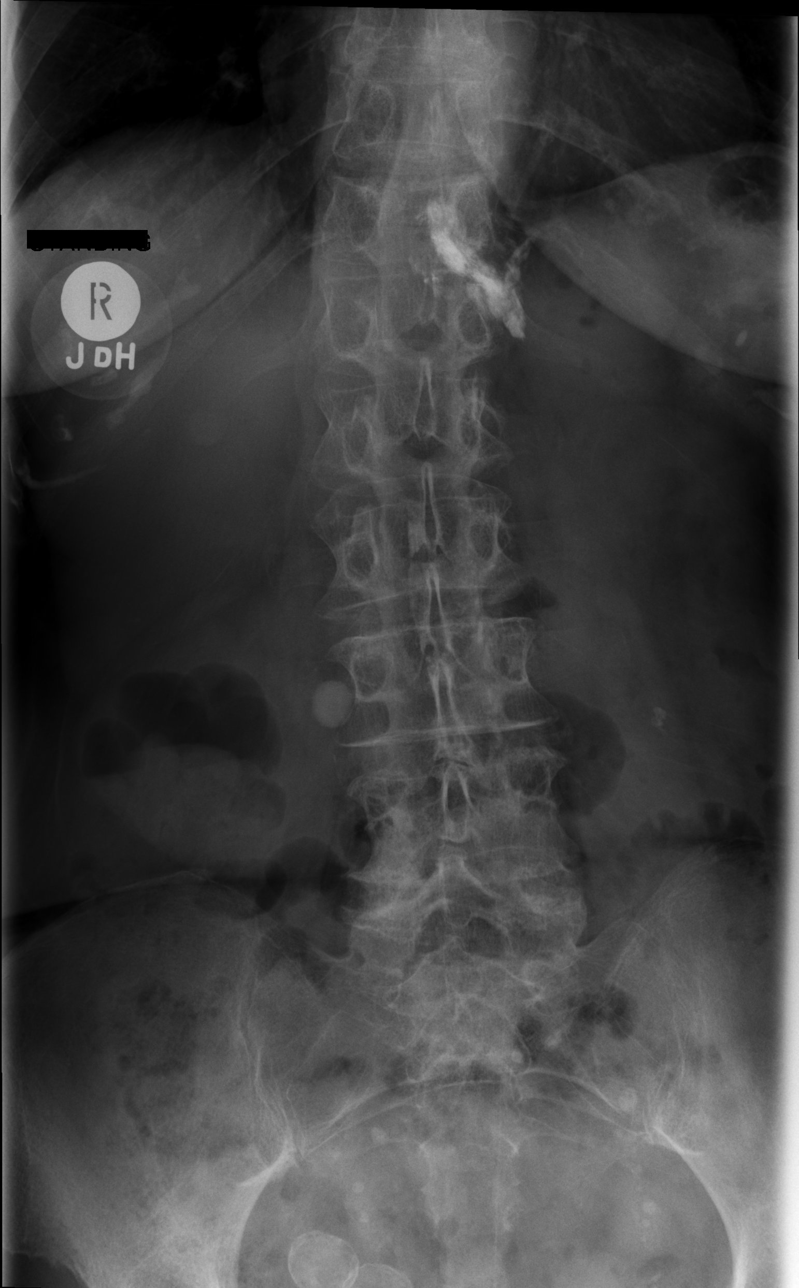

[w lumbar spine obl (1 of 2)]
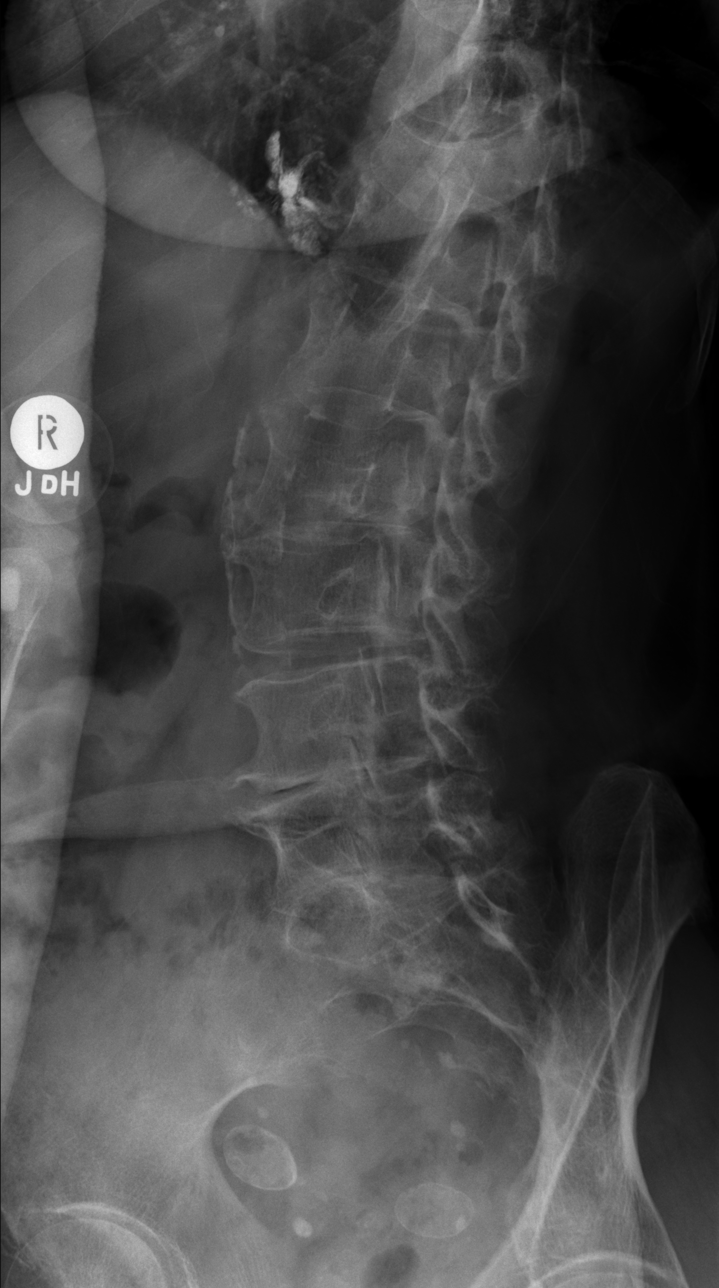

[w lumbar spine obl (2 of 2)]
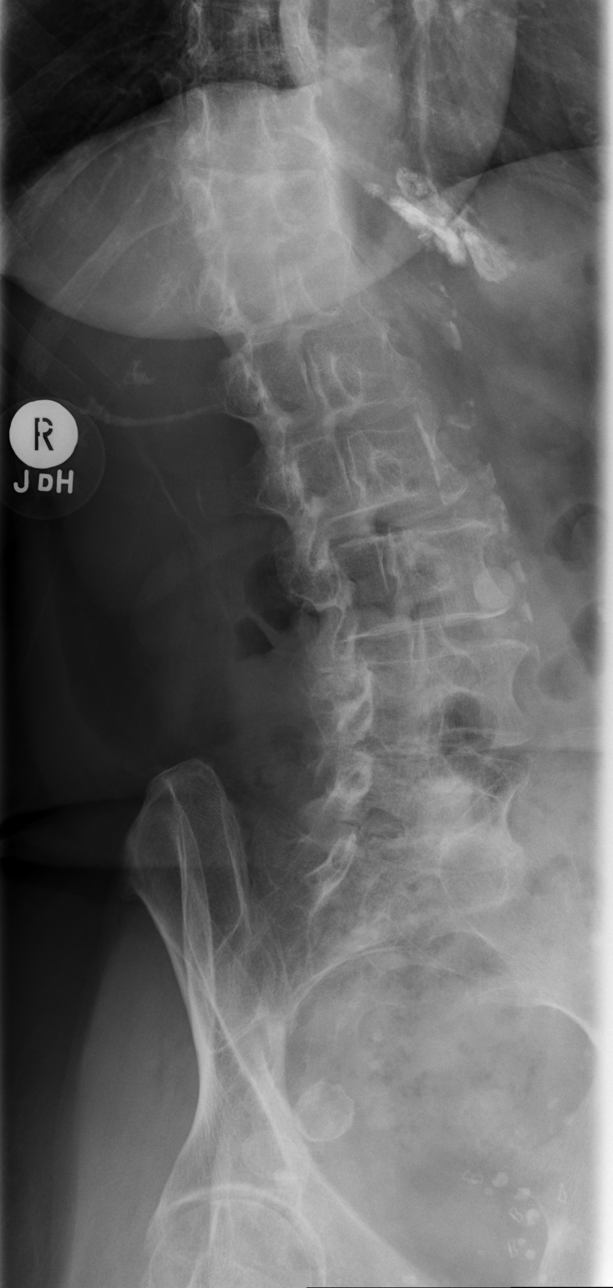

[w lumbar spine lat]
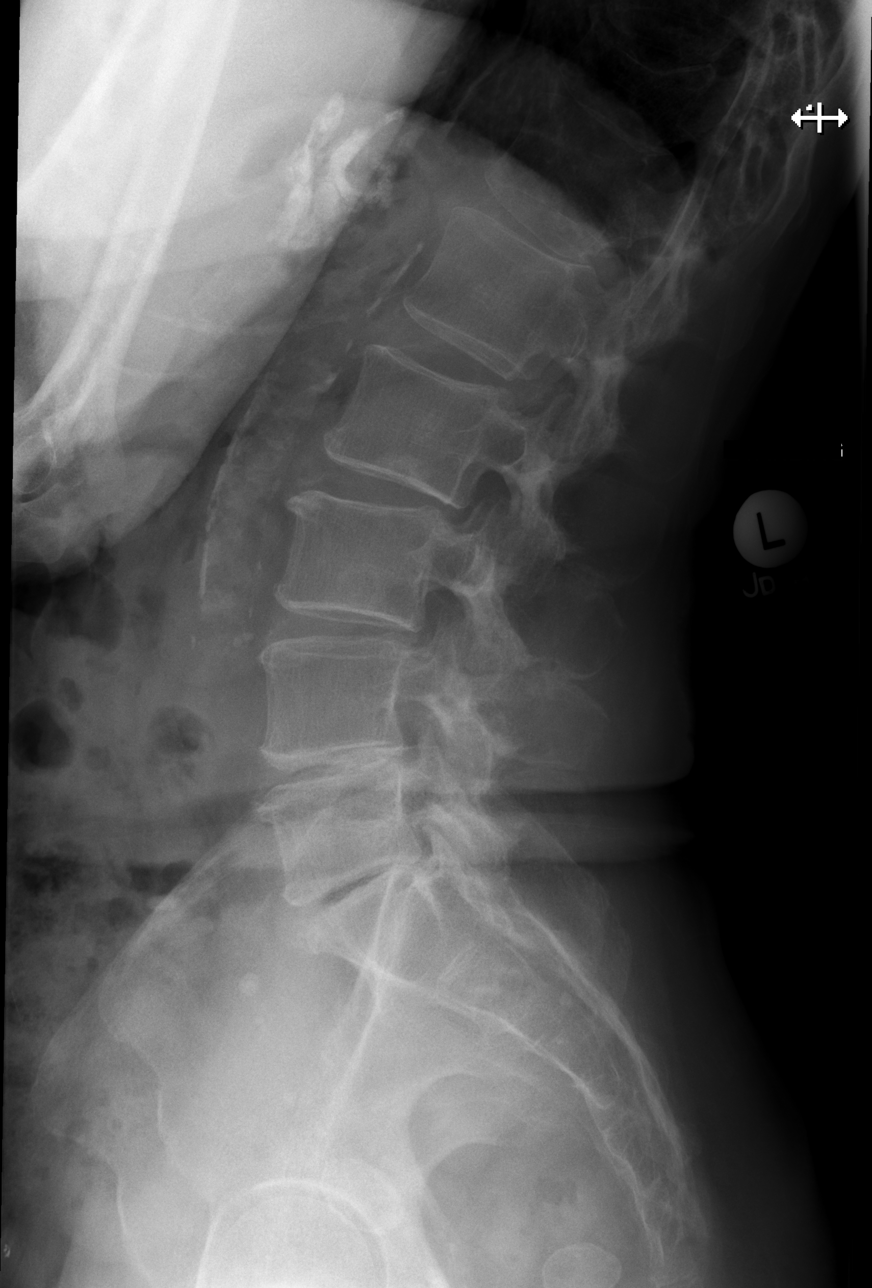

[w lumbar l-5 s-1 spot]
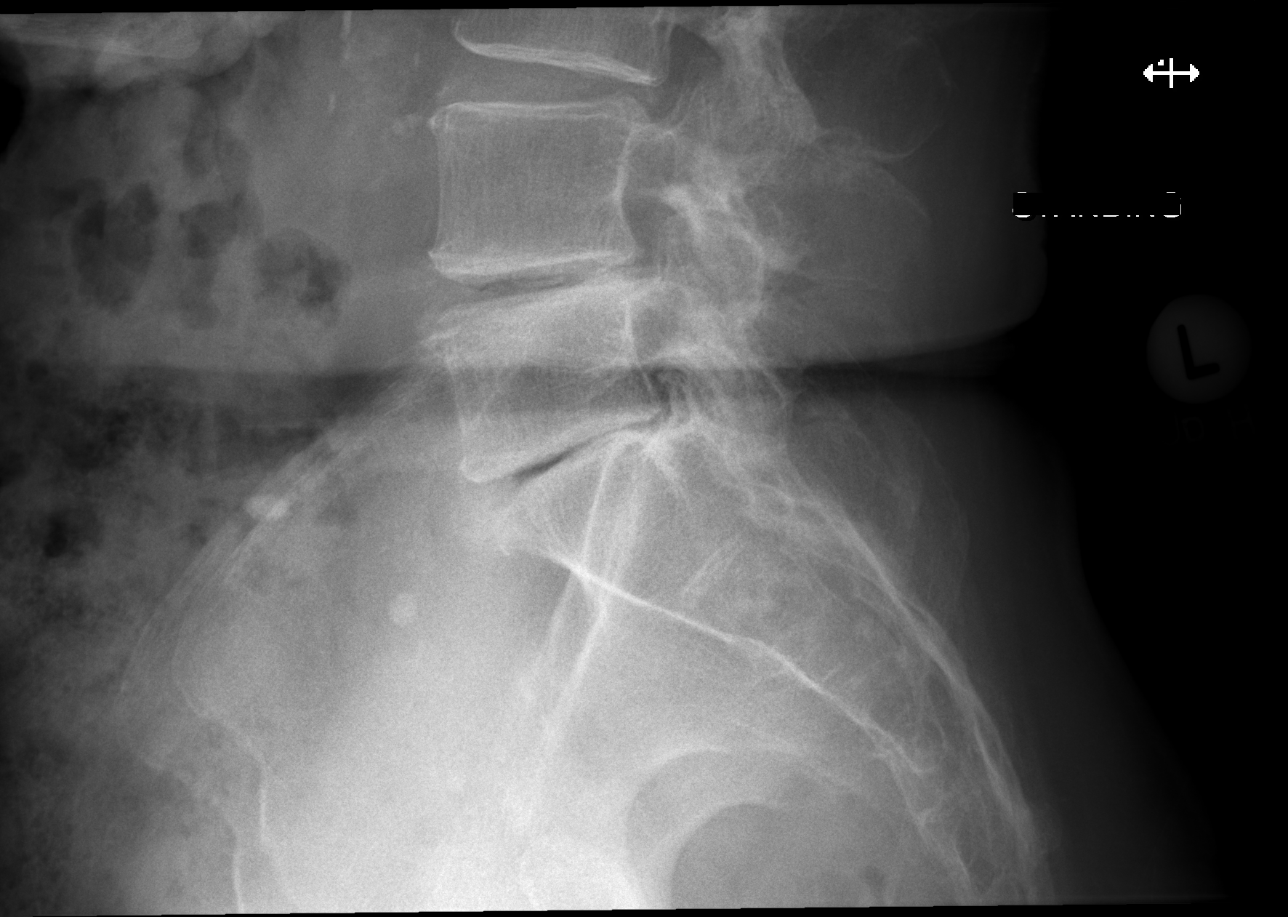

[5 of 5 positions shown; findings below may reference images not displayed]

FINDINGS: The lumbar vertebrae are normal alignment. There is degenerative
disc disease most marked at L4-5 and L5-S1 levels, where there is
loss of disc space and sclerosis with spurring. Vacuum disc
phenomenon is noted at these levels as well. Compression of T11
vertebral bodies again noted. Moderate abdominal aortic
atherosclerosis is present. The SI joints are corticated.
Calcifications are noted in the bony pelvis which are not well
visualized on the current KUB but could represent urinary bladder
calculi. Clinical correlation is recommended.
IMPRESSION: 1. Degenerative disc disease at L4-5 and L5-S1.
2. Partial compression deformity of T11 vertebral body.
3. Rounded calcifications in the bony pelvis could represent urinary
bladder calculi. Correlate clinically. These are not well seen on
the current image.

## 2018-06-11 DIAGNOSIS — H0012 Chalazion right lower eyelid: Secondary | ICD-10-CM | POA: Diagnosis not present

## 2018-06-12 DIAGNOSIS — H0012 Chalazion right lower eyelid: Secondary | ICD-10-CM | POA: Diagnosis not present

## 2018-06-15 ENCOUNTER — Other Ambulatory Visit: Payer: Self-pay | Admitting: Family Medicine

## 2018-07-23 ENCOUNTER — Encounter: Payer: Self-pay | Admitting: Family Medicine

## 2018-08-18 ENCOUNTER — Encounter: Payer: Self-pay | Admitting: Family Medicine

## 2018-08-18 ENCOUNTER — Ambulatory Visit (INDEPENDENT_AMBULATORY_CARE_PROVIDER_SITE_OTHER): Payer: PPO | Admitting: Family Medicine

## 2018-08-18 ENCOUNTER — Other Ambulatory Visit: Payer: Self-pay

## 2018-08-18 VITALS — BP 160/80 | HR 80 | Temp 98.0°F | Resp 16 | Ht 63.0 in | Wt 120.0 lb

## 2018-08-18 DIAGNOSIS — N3281 Overactive bladder: Secondary | ICD-10-CM

## 2018-08-18 DIAGNOSIS — F5101 Primary insomnia: Secondary | ICD-10-CM | POA: Diagnosis not present

## 2018-08-18 DIAGNOSIS — Z0001 Encounter for general adult medical examination with abnormal findings: Secondary | ICD-10-CM

## 2018-08-18 DIAGNOSIS — Z0000 Encounter for general adult medical examination without abnormal findings: Secondary | ICD-10-CM

## 2018-08-18 DIAGNOSIS — I10 Essential (primary) hypertension: Secondary | ICD-10-CM | POA: Diagnosis not present

## 2018-08-18 DIAGNOSIS — M810 Age-related osteoporosis without current pathological fracture: Secondary | ICD-10-CM | POA: Diagnosis not present

## 2018-08-18 MED ORDER — SOLIFENACIN SUCCINATE 10 MG PO TABS: 10.0000 mg | ORAL_TABLET | Freq: Every day | ORAL | 5 refills | Status: DC

## 2018-08-18 MED ORDER — TRAZODONE HCL 50 MG PO TABS: 50.0000 mg | ORAL_TABLET | Freq: Every evening | ORAL | 3 refills | Status: DC | PRN

## 2018-08-18 NOTE — Progress Notes (Signed)
Subjective:    Patient ID: Brittany Levy, female    DOB: 04/04/1934, 84 y.o.   MRN: [id number]  HPI  Patient is here today for complete physical exam.  She has a couple of concerns.  First she develops a runny nose every time she gets in a cold air conditioned home.  It only occurs when there is while temperature fluctuations from heat cold suggesting vasomotor rhinitis.  She also complains of urinary incontinence whenever she sneezes or coughs.  She has a history of stress incontinence and actually had a bladder tack performed in the past however she is also started experiencing sudden frequent urges to urinate and the inability to make it to the restroom in time.  For this reason she is having to wear a pad all the time.  She also complains of trouble sleeping.  She finds that her mind races at night.  If she is able to fall asleep she can only sleep for approximately 4 hours.  This leaves her feeling tired during the day.  She has a well-documented history of whitecoat syndrome.  Her blood pressure today is elevated however anxiety plays a large role in her blood pressure.  She is not checking her blood pressure at home.  Her mammogram is scheduled for later this month.  Due to age she does not require a Pap smear or colonoscopy.  Her last bone density was in 2018 and showed osteoporosis.  She is currently receiving Prolia injections every month and is taking calcium plus vitamin D.  She denies any problems with depression.  She denies any falls.  She denies any memory loss  Past Medical History:  Diagnosis Date  . Chest pain   . GERD (gastroesophageal reflux disease)   . Hypertension   . Insomnia   . Osteoporosis of femur without pathological fracture    Past Surgical History:  Procedure Laterality Date  . ABDOMINAL HYSTERECTOMY     Current Outpatient Medications on File Prior to Visit  Medication Sig Dispense Refill  . aspirin 81 MG tablet Take 81 mg by mouth daily.    .  Calcium-Magnesium-Vitamin D (CALCIUM 1200+D3 PO) Take by mouth.    . calcium-vitamin D (OSCAL WITH D) 250-125 MG-UNIT per tablet Take 1 tablet by mouth 2 (two) times daily.    . carvedilol (COREG) 6.25 MG tablet TAKE 1 TABLET BY MOUTH TWICE A DAY WITH A MEAL 180 tablet 3  . clonazePAM (KLONOPIN) 0.5 MG tablet Take 1 tablet (0.5 mg total) by mouth every 8 (eight) hours as needed. for anxiety 60 tablet 1  . cloNIDine (CATAPRES - DOSED IN MG/24 HR) 0.3 mg/24hr patch PLACE 1 PATCH ON THE SKIN ONCE A WEEK ASDIRECTED 16 patch 3  . felodipine (PLENDIL) 5 MG 24 hr tablet TAKE 1 TABLET BY MOUTH ONCE DAILY 90 tablet 0  . hydrALAZINE (APRESOLINE) 25 MG tablet TAKE 1 TABLET BY MOUTH TWICE A DAY 180 tablet 3  . hydrochlorothiazide (HYDRODIURIL) 25 MG tablet TAKE 1 TABLET BY MOUTH ONCE A DAY 180 tablet 3  . irbesartan (AVAPRO) 300 MG tablet TAKE 1 TABLET BY MOUTH ONCE DAILY 90 tablet 0  . Omega-3 Fatty Acids (FISH OIL) 1200 MG CAPS Take 2 capsules by mouth.     Marland Kitchen omeprazole (PRILOSEC) 40 MG capsule TAKE 1 CAPSULE BY MOUTH ONCE DAILY 90 capsule 3  . potassium chloride (K-DUR) 10 MEQ tablet TAKE 1 TABLET BY MOUTH ONCE DAILY 90 tablet 3   Current Facility-Administered Medications  on File Prior to Visit  Medication Dose Route Frequency Provider Last Rate Last Dose  . denosumab (PROLIA) injection 60 mg  60 mg Subcutaneous Q6 months Donita BrooksPickard,  T, MD   60 mg at 01/13/18 [id number]   No Known Allergies Social History   Socioeconomic History  . Marital status: Married    Spouse name: Not on file  . Number of children: Not on file  . Years of education: Not on file  . Highest education level: Not on file  Occupational History  . Not on file  Social Needs  . Financial resource strain: Not on file  . Food insecurity    Worry: Not on file    Inability: Not on file  . Transportation needs    Medical: Not on file    Non-medical: Not on file  Tobacco Use  . Smoking status: Never Smoker  . Smokeless tobacco:  Never Used  Substance and Sexual Activity  . Alcohol use: Yes    Comment: wine occasionally  . Drug use: No  . Sexual activity: Not on file  Lifestyle  . Physical activity    Days per week: Not on file    Minutes per session: Not on file  . Stress: Not on file  Relationships  . Social Musicianconnections    Talks on phone: Not on file    Gets together: Not on file    Attends religious service: Not on file    Active member of club or organization: Not on file    Attends meetings of clubs or organizations: Not on file    Relationship status: Not on file  . Intimate partner violence    Fear of current or ex partner: Not on file    Emotionally abused: Not on file    Physically abused: Not on file    Forced sexual activity: Not on file  Other Topics Concern  . Not on file  Social History Narrative  . Not on file     Review of Systems  All other systems reviewed and are negative.      Objective:   Physical Exam  Constitutional: She is oriented to person, place, and time. She appears well-developed and well-nourished. No distress.  HENT:  Head: Normocephalic and atraumatic.  Right Ear: External ear normal.  Left Ear: External ear normal.  Nose: Nose normal.  Mouth/Throat: Oropharynx is clear and moist. No oropharyngeal exudate.  Eyes: Pupils are equal, round, and reactive to light. Conjunctivae and EOM are normal. Right eye exhibits no discharge. Left eye exhibits no discharge. No scleral icterus.  Neck: Normal range of motion. Neck supple. No JVD present. No tracheal deviation present. No thyromegaly present.  Cardiovascular: Normal rate, regular rhythm and normal heart sounds. Exam reveals no gallop and no friction rub.  No murmur heard. Pulmonary/Chest: Effort normal and breath sounds normal. No stridor. No respiratory distress. She has no wheezes. She has no rales. She exhibits no tenderness.  Abdominal: Soft. Bowel sounds are normal. She exhibits no distension and no mass.  There is no abdominal tenderness. There is no rebound and no guarding.  Musculoskeletal:        General: No edema.     Cervical back: She exhibits deformity.     Thoracic back: She exhibits decreased range of motion and tenderness.     Lumbar back: She exhibits decreased range of motion and tenderness.  Lymphadenopathy:    She has no cervical adenopathy.  Neurological: She is alert and oriented  to person, place, and time. She has normal reflexes. No cranial nerve deficit. She exhibits normal muscle tone. Coordination normal.  Skin: Skin is warm. No rash noted. She is not diaphoretic. No erythema. No pallor.  Psychiatric: She has a normal mood and affect. Her behavior is normal. Judgment and thought content normal.  Vitals reviewed.  kyphosis       Assessment & Plan:  1. Benign essential HTN Blood pressure today is elevated.  Of asked the patient to start checking her blood pressure daily and notify me if consistently greater than 140/90.  Check fasting lipid panel.  Goal LDL cholesterol is less than 70 - CBC with Differential/Platelet - COMPLETE METABOLIC PANEL WITH GFR - Lipid panel  2. Routine general medical examination at a health care facility Patient is due for a tetanus shot but she politely declines this.  Mammogram is already been scheduled.  She does not require colonoscopy or Pap smear.  She denies any problems with depression falls memory loss or trouble with ADLs.  3. Age-related osteoporosis without current pathological fracture Currently on Prolia calcium and vitamin D.  Repeat DEXA next year  4. Primary insomnia Try trazodone 50 mg p.o. nightly for insomnia  5. OAB (overactive bladder) Try Vesicare 10 mg p.o. daily for overactive bladder.  Recommended Ke[place] exercises for stress incontinence.  Monitor for constipation due to the anticholinergic side effects of trazodone and Vesicare

## 2018-08-19 LAB — LIPID PANEL
Cholesterol: 211 mg/dL — ABNORMAL HIGH (ref <200)
HDL: 70 mg/dL (ref >=50)
LDL Cholesterol (Calc): 121 mg/dL (calc) — ABNORMAL HIGH
Non-HDL Cholesterol (Calc): 141 mg/dL (calc) — ABNORMAL HIGH (ref <130)
Total CHOL/HDL Ratio: 3 (calc) (ref <5.0)
Triglycerides: 101 mg/dL (ref <150)

## 2018-08-19 LAB — CBC WITH DIFFERENTIAL/PLATELET
Absolute Monocytes: 572 cells/uL (ref 200–950)
Basophils Absolute: 47 cells/uL (ref 0–200)
Basophils Relative: 0.8 %
Eosinophils Absolute: 142 cells/uL (ref 15–500)
Eosinophils Relative: 2.4 %
HCT: 37.3 % (ref 35.0–45.0)
Hemoglobin: 12.4 g/dL (ref 11.7–15.5)
Lymphs Abs: 2419 cells/uL (ref 850–3900)
MCH: 29.8 pg (ref 27.0–33.0)
MCHC: 33.2 g/dL (ref 32.0–36.0)
MCV: 89.7 fL (ref 80.0–100.0)
MPV: 10.8 fL (ref 7.5–12.5)
Monocytes Relative: 9.7 %
Neutro Abs: 2720 cells/uL (ref 1500–7800)
Neutrophils Relative %: 46.1 %
Platelets: 195 10*3/uL (ref 140–400)
RBC: 4.16 10*6/uL (ref 3.80–5.10)
RDW: 12.7 % (ref 11.0–15.0)
Total Lymphocyte: 41 %
WBC: 5.9 10*3/uL (ref 3.8–10.8)

## 2018-08-19 LAB — COMPLETE METABOLIC PANEL WITH GFR
AG Ratio: 1.5 (calc) (ref 1.0–2.5)
ALT: 12 U/L (ref 6–29)
AST: 12 U/L (ref 10–35)
Albumin: 4 g/dL (ref 3.6–5.1)
Alkaline phosphatase (APISO): 36 U/L — ABNORMAL LOW (ref 37–153)
BUN: 14 mg/dL (ref 7–25)
CO2: 35 mmol/L — ABNORMAL HIGH (ref 20–32)
Calcium: 9.9 mg/dL (ref 8.6–10.4)
Chloride: 97 mmol/L — ABNORMAL LOW (ref 98–110)
Creat: 0.73 mg/dL (ref 0.60–0.88)
GFR, Est African American: 89 mL/min/{1.73_m2} (ref >=60)
GFR, Est Non African American: 77 mL/min/{1.73_m2} (ref >=60)
Globulin: 2.6 g/dL (calc) (ref 1.9–3.7)
Glucose, Bld: 97 mg/dL (ref 65–99)
Potassium: 4.3 mmol/L (ref 3.5–5.3)
Sodium: 136 mmol/L (ref 135–146)
Total Bilirubin: 0.5 mg/dL (ref 0.2–1.2)
Total Protein: 6.6 g/dL (ref 6.1–8.1)

## 2018-08-24 IMAGING — US US BREAST*R* LIMITED INC AXILLA
1 series · 13 of 15 positions shown · non-contrast
Comparison: Previous exam(s).

CLINICAL DATA: Callback from screening mammogram for possible right
breast asymmetry

EXAM:
2D DIGITAL DIAGNOSTIC RIGHT MAMMOGRAM WITH CAD AND ADJUNCT TOMO
ULTRASOUND RIGHT BREAST

[Series 1: us breast*right* limited inc axilla · 0.06mm/px · 13 of 15 slices shown]
[im 1/15]
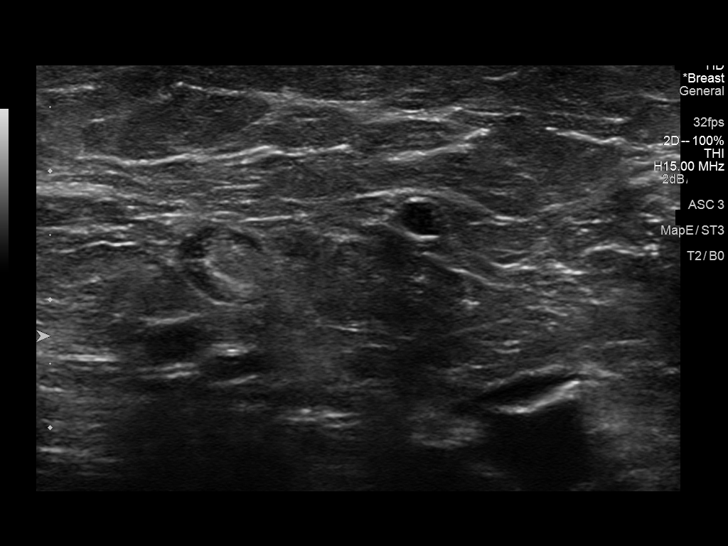
[im 2/15]
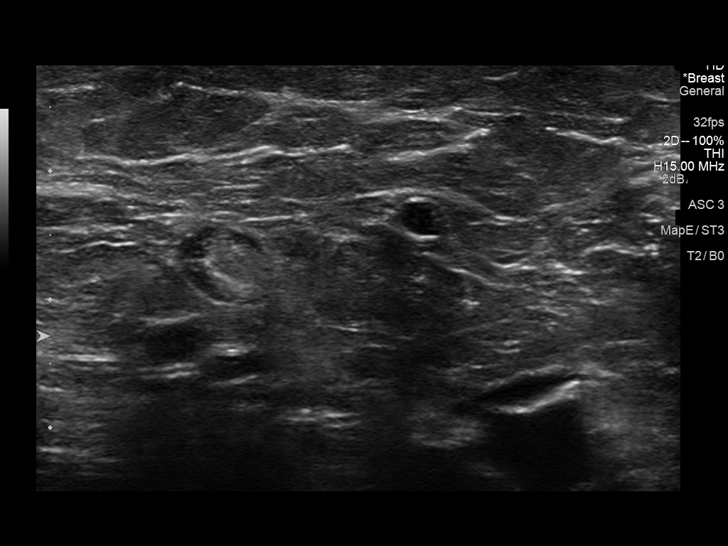
[im 3/15]
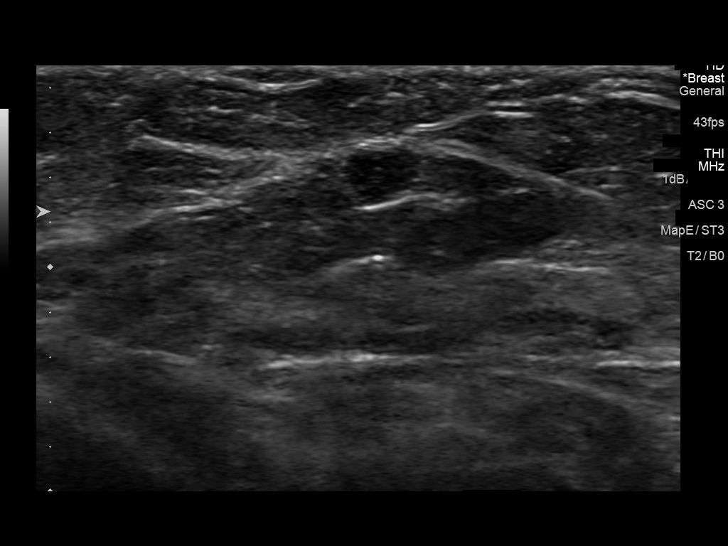
[im 5/15]
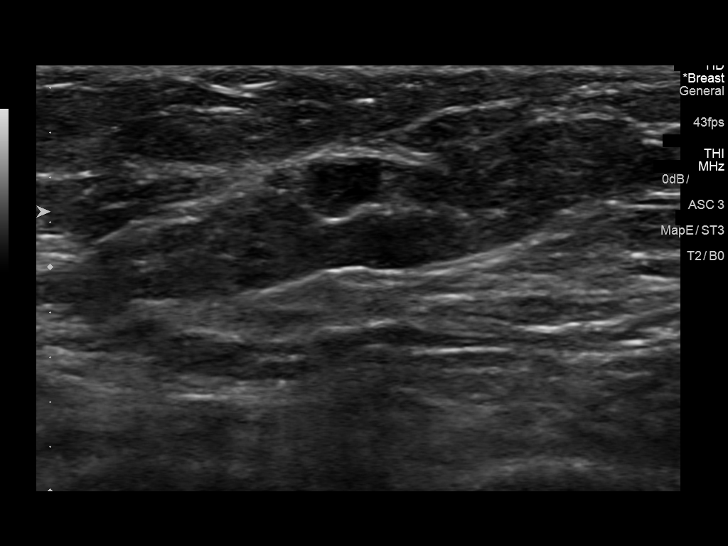
[im 6/15]
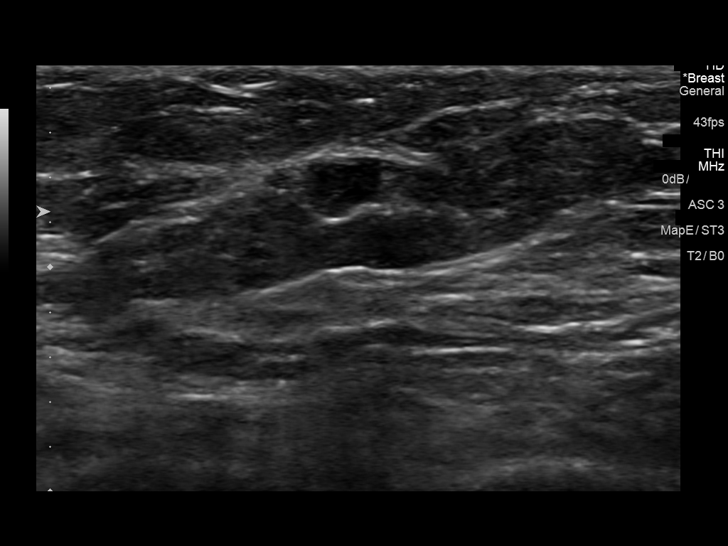
[im 7/15]
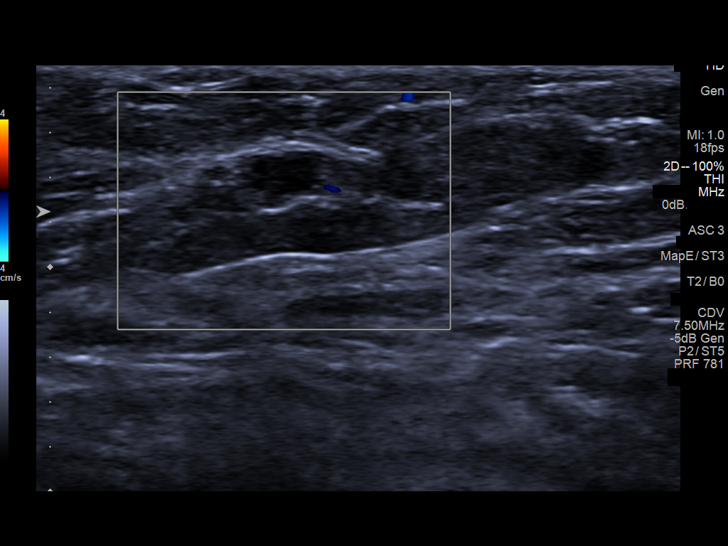
[im 8/15]
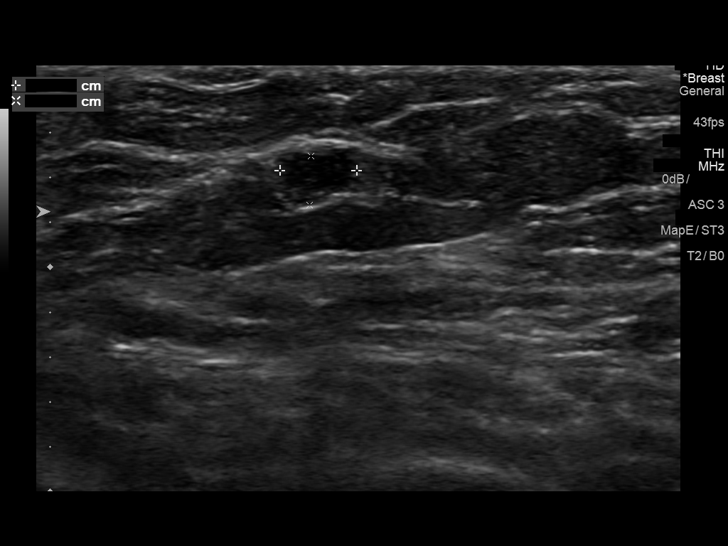
[im 9/15]
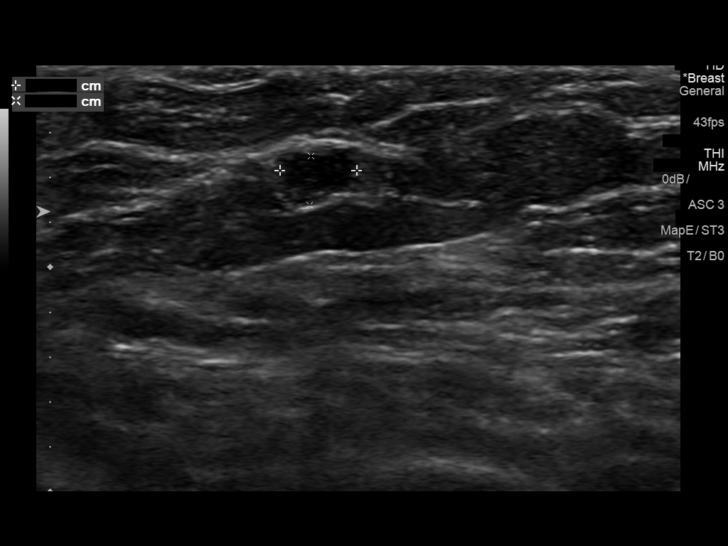
[im 10/15]
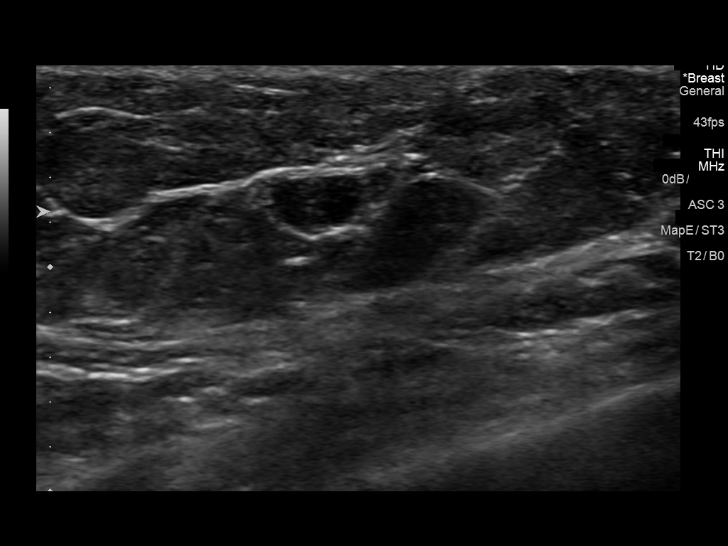
[im 11/15]
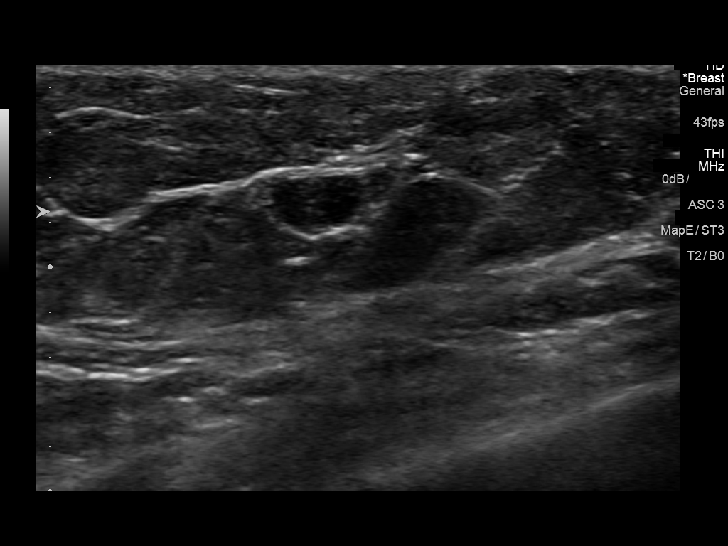
[im 13/15]
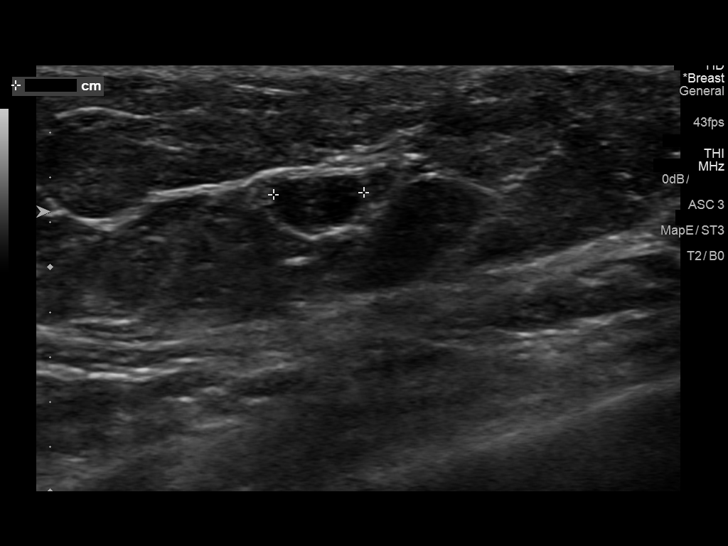
[im 14/15]
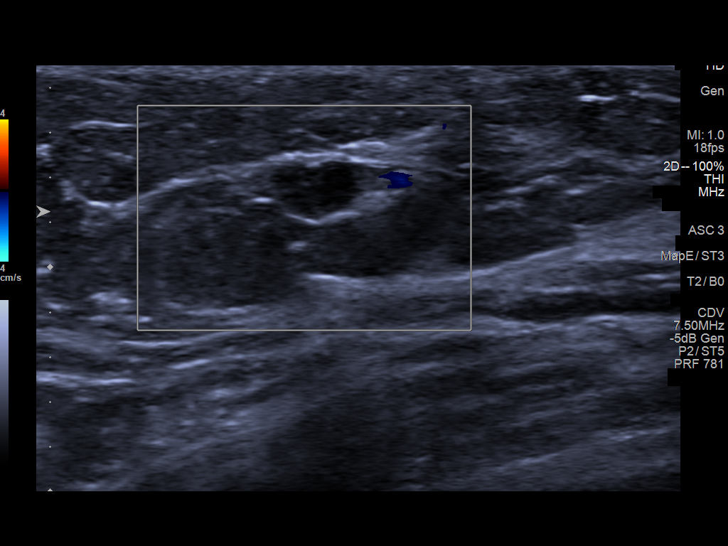
[im 15/15]
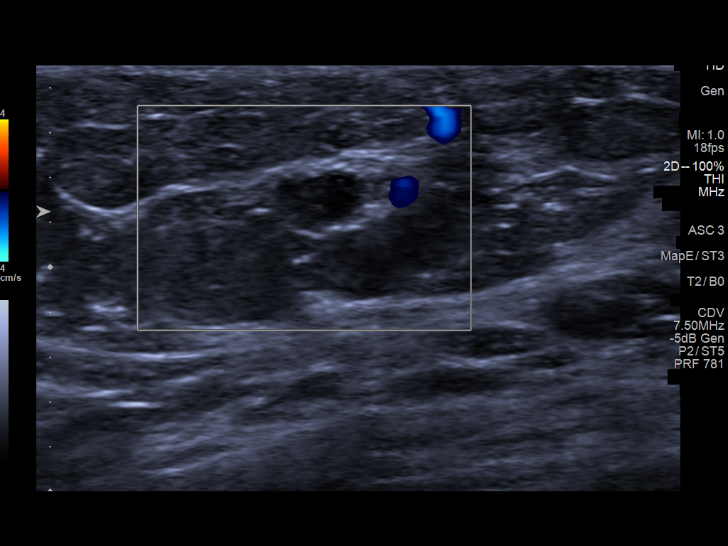

[13 of 15 positions shown; findings below may reference images not displayed]

ACR Breast Density Category c: The breast tissue is heterogeneously
dense, which may obscure small masses.
FINDINGS: The possible asymmetry identified on screening mammogram within the
posterior, superior right breast persists but is less prominent on
the additional views. This asymmetry was likely present on the 2151
exam and similar in size. A possible correlate for this asymmetry is
identified within the posterior, lateral right breast on today's
XCCL view.

Mammographic images were processed with CAD.

On physical exam, no discrete mass is felt in the area of concern
within the lateral right breast.

Targeted ultrasound is performed, showing an oval hypoechoic mass
with indistinct margins at 10 o'clock, 10 cm from the nipple
measuring 3 x 2 x 4 mm. This mass may correlate to the asymmetry
seen mammographically.
IMPRESSION: Indeterminate right breast mass at 10 o'clock, 10 cm from the
nipple.

RECOMMENDATION:
Ultrasound-guided right breast biopsy. If the biopsied mass does not
correlate to the asymmetry seen mammographically, short-term
follow-up is recommended.

I have discussed the findings and recommendations with the patient.
Results were also provided in writing at the conclusion of the
visit. If applicable, a reminder letter will be sent to the patient
regarding the next appointment.

BI-RADS CATEGORY  4: Suspicious.

## 2018-09-02 ENCOUNTER — Other Ambulatory Visit: Payer: Self-pay | Admitting: Family Medicine

## 2018-09-03 ENCOUNTER — Other Ambulatory Visit: Payer: Self-pay | Admitting: Family Medicine

## 2018-09-03 DIAGNOSIS — Z1231 Encounter for screening mammogram for malignant neoplasm of breast: Secondary | ICD-10-CM

## 2018-09-08 ENCOUNTER — Encounter: Payer: Self-pay | Admitting: Family Medicine

## 2018-09-08 ENCOUNTER — Other Ambulatory Visit: Payer: Self-pay

## 2018-09-08 ENCOUNTER — Ambulatory Visit (INDEPENDENT_AMBULATORY_CARE_PROVIDER_SITE_OTHER): Payer: PPO | Admitting: Family Medicine

## 2018-09-08 DIAGNOSIS — I10 Essential (primary) hypertension: Secondary | ICD-10-CM

## 2018-09-08 DIAGNOSIS — F411 Generalized anxiety disorder: Secondary | ICD-10-CM | POA: Diagnosis not present

## 2018-09-08 MED ORDER — CLONAZEPAM 0.5 MG PO TABS: 0.5000 mg | ORAL_TABLET | Freq: Two times a day (BID) | ORAL | 1 refills | Status: DC | PRN

## 2018-09-08 NOTE — Patient Instructions (Addendum)
Take 1/2 tablet of the trazodone at bedtime every other night  Use klonopin for anxiety  Take hydralazine in the morning then again, around 1:30pm and still take your evening dose- to bring your blood pressure  Do this for 2 weeks send [place] readings  F/U as previous Dr. Dennard Schaumann

## 2018-09-08 NOTE — Assessment & Plan Note (Signed)
For anxiety per above for the clonazepam.  Also recommend that she take 25 mg of trazodone every other night and see how she does with this.

## 2018-09-08 NOTE — Assessment & Plan Note (Signed)
Her blood pressure improved just sitting in the office.  She has had extensive work-up for her blood pressure of multiple medications.  I think anxiety is playing a big role.  Img going  have her take hydralazine 25mg  3 times a day her other medications will stay the same .  Also refilled her clonazepam.  She is going to track her blood pressure over the next 2 weeks and send it into her PCP.  I reviewed her recent labs which were fairly unremarkable.  She does not have any symptoms from her elevated blood pressure at this time.

## 2018-09-08 NOTE — Progress Notes (Addendum)
   Subjective:    Patient ID: Brittany Levy, female    DOB: 07/28/1934, 84 y.o.   MRN: [id number]  Patient presents for Hypertension  Pt here for Bp check at home her blood pressure systolic was in the 619J.  She had been recently seen by her PCP I reviewed that note 2 weeks ago.  She is on multiple medications for her blood pressure.  She is also seen cardiology in the past.  Her anxiety tends to make her blood pressure spike.  At home her readings over the past 2 weeks have ranged from 120s to 1 150s over 50s to 60s.  She did have one elevated blood pressure at 177/81 a couple days ago.  She took her last clonazepam as she felt anxious and then checked her blood pressure that afternoon it was down to 131/65.  She admits that she does get very anxious she is not able to do her regular activities in the setting of [place].  She would like to refill her clonazepam which she uses sparingly.  She was also started on trazodone she did try taking every night but felt drowsy the next day.  She has been taking 50 mg every other day and this is been better but still feels a little drowsy  Has not started Vesicare once she turned the air down her bladder symptoms improved?? See PCP note    Review Of Systems:  GEN- denies fatigue, fever, weight loss,weakness, recent illness HEENT- denies eye drainage, change in vision, nasal discharge, CVS- denies chest pain, palpitations RESP- denies SOB, cough, wheeze ABD- denies N/V, change in stools, abd pain GU- denies dysuria, hematuria, dribbling, incontinence MSK- denies joint pain, muscle aches, injury Neuro- denies headache, dizziness, syncope, seizure activity       Objective:    BP (!) 168/68   Pulse 92   Temp 98.4 F (36.9 C) (Oral)   Resp 18   SpO2 96%  GEN- NAD, alert and oriented x3 HEENT- PERRL, EOMI, non injected sclera, pink conjunctiva, MMM, oropharynx clear Neck- Supple, no thyromegaly CVS- RRR, no murmur RESP-CTAB Psych- normal affect  and mood EXT- No edema Pulses- Radial, DP- 2+        Assessment & Plan:      Problem List Items Addressed This Visit      Unprioritized   GAD (generalized anxiety disorder)    For anxiety per above for the clonazepam.  Also recommend that she take 25 mg of trazodone every other night and see how she does with this.      Hypertension    Her blood pressure improved just sitting in the office.  She has had extensive work-up for her blood pressure of multiple medications.  I think anxiety is playing a big role.  Img going  have her take hydralazine 25mg  3 times a day her other medications will stay the same .  Also refilled her clonazepam.  She is going to track her blood pressure over the next 2 weeks and send it into her PCP.  I reviewed her recent labs which were fairly unremarkable.  She does not have any symptoms from her elevated blood pressure at this time.         Note: This dictation was prepared with Dragon dictation along with smaller phrase technology. Any transcriptional errors that result from this process are unintentional.

## 2018-09-12 ENCOUNTER — Other Ambulatory Visit: Payer: Self-pay | Admitting: Family Medicine

## 2018-09-14 ENCOUNTER — Other Ambulatory Visit: Payer: Self-pay

## 2018-09-14 ENCOUNTER — Ambulatory Visit
Admission: RE | Admit: 2018-09-14 | Discharge: 2018-09-14 | Disposition: A | Discharge status: Home / Self Care | Payer: PPO | Source: Ambulatory Visit | Attending: Family Medicine | Admitting: Family Medicine

## 2018-09-14 DIAGNOSIS — Z1231 Encounter for screening mammogram for malignant neoplasm of breast: Secondary | ICD-10-CM

## 2018-09-28 ENCOUNTER — Encounter: Payer: Self-pay | Admitting: Family Medicine

## 2018-10-02 ENCOUNTER — Other Ambulatory Visit: Payer: Self-pay

## 2018-10-02 ENCOUNTER — Telehealth: Payer: Self-pay

## 2018-10-02 MED ORDER — ATENOLOL 50 MG PO TABS: 50.0000 mg | ORAL_TABLET | Freq: Every day | ORAL | 0 refills | Status: DC

## 2018-10-02 NOTE — Telephone Encounter (Signed)
Pt aware.

## 2018-10-02 NOTE — Telephone Encounter (Signed)
Pt called to report her bp reading this morning on 08/28 (157/117 and 194/94). Pt states that she is taking her hydralazine tid and it is not helping. Pt is also taking carvedilol twice daily and htz once daily. Pt also states that her only symptom is puffy feet but she is concerned because her bp is so high. Please advise.

## 2018-10-02 NOTE — Telephone Encounter (Signed)
Add atenolol 50 mg poqday

## 2018-10-05 ENCOUNTER — Encounter: Payer: Self-pay | Admitting: Family Medicine

## 2018-10-08 ENCOUNTER — Ambulatory Visit (INDEPENDENT_AMBULATORY_CARE_PROVIDER_SITE_OTHER): Payer: PPO | Admitting: Family Medicine

## 2018-10-08 ENCOUNTER — Other Ambulatory Visit: Payer: Self-pay

## 2018-10-08 VITALS — BP 160/70 | HR 71 | Temp 98.2°F | Resp 18 | Ht 63.0 in | Wt 120.0 lb

## 2018-10-08 DIAGNOSIS — I10 Essential (primary) hypertension: Secondary | ICD-10-CM

## 2018-10-08 DIAGNOSIS — F411 Generalized anxiety disorder: Secondary | ICD-10-CM

## 2018-10-08 DIAGNOSIS — Z23 Encounter for immunization: Secondary | ICD-10-CM

## 2018-10-08 MED ORDER — CLONAZEPAM 0.5 MG PO TABS: 0.5000 mg | ORAL_TABLET | Freq: Three times a day (TID) | ORAL | 1 refills | Status: DC | PRN

## 2018-10-08 MED ORDER — CLONIDINE HCL 0.2 MG PO TABS: 0.1000 mg | ORAL_TABLET | Freq: Three times a day (TID) | ORAL | 2 refills | Status: DC

## 2018-10-08 NOTE — Progress Notes (Signed)
Subjective:    Patient ID: Brittany Levy, female    DOB: 30-Apr-1934, 84 y.o.   MRN: [id number]  HPI Patient's blood pressure has recently been very difficult to control.  She is started checking it at home and finding systolic blood pressures between 160 and 200.  Recently I increased her hydralazine from 25 to 50 mg 3 times daily.  This did not seem to help her blood pressure at all.  Therefore I started her on atenolol.  However I was unaware that she was also taking carvedilol.  This was my mistake.  On both beta-blockers however her blood pressure has not improved at all.  Furthermore she is slightly bradycardic with a heart rate in the 50s to 60s.  Patient feels extremely anxious.  She is questioning if anxiety could be raising her blood pressure.  She is having a difficult time sleeping and trazodone does not seem to help.  She used to go several places during the day such as shopping and volunteering and visiting people.  With the COVID-19 pandemic she feels extremely isolated and afraid to go anywhere including church.  As a result she feels generally anxious all the time and at times even panicked. Past Medical History:  Diagnosis Date  . Chest pain   . GERD (gastroesophageal reflux disease)   . Hypertension   . Insomnia   . Osteoporosis of femur without pathological fracture    Past Surgical History:  Procedure Laterality Date  . ABDOMINAL HYSTERECTOMY    . BREAST BIOPSY Right 05/07/2016   FRAGMENTS OF LYMPH NODE   Current Outpatient Medications on File Prior to Visit  Medication Sig Dispense Refill  . aspirin 81 MG tablet Take 81 mg by mouth daily.    . calcium-vitamin D (OSCAL WITH D) 250-125 MG-UNIT per tablet Take 1 tablet by mouth 2 (two) times daily.    . carvedilol (COREG) 6.25 MG tablet TAKE 1 TABLET BY MOUTH TWICE A DAY WITH A MEAL 180 tablet 3  . clonazePAM (KLONOPIN) 0.5 MG tablet Take 1 tablet (0.5 mg total) by mouth 2 (two) times daily as needed for anxiety. 30  tablet 1  . felodipine (PLENDIL) 5 MG 24 hr tablet TAKE 1 TABLET BY MOUTH ONCE DAILY *NEED OFFICE VISIT AND LABS BEFORE MORE REFILLS* 90 tablet 3  . hydrALAZINE (APRESOLINE) 25 MG tablet TAKE 1 TABLET BY MOUTH TWICE A DAY 180 tablet 3  . hydrochlorothiazide (HYDRODIURIL) 25 MG tablet TAKE 1 TABLET BY MOUTH ONCE A DAY 180 tablet 3  . irbesartan (AVAPRO) 300 MG tablet Take 1 tablet (300 mg total) by mouth daily. 90 tablet 3  . Omega-3 Fatty Acids (FISH OIL) 1200 MG CAPS Take 2 capsules by mouth.     Marland Kitchen omeprazole (PRILOSEC) 40 MG capsule TAKE 1 CAPSULE BY MOUTH ONCE DAILY 90 capsule 3  . potassium chloride (K-DUR) 10 MEQ tablet TAKE 1 TABLET BY MOUTH ONCE DAILY 90 tablet 3  . traZODone (DESYREL) 50 MG tablet Take 1 tablet (50 mg total) by mouth at bedtime as needed for sleep. 30 tablet 3   Current Facility-Administered Medications on File Prior to Visit  Medication Dose Route Frequency Provider Last Rate Last Dose  . denosumab (PROLIA) injection 60 mg  60 mg Subcutaneous Q6 months Susy Frizzle, MD   60 mg at 08/18/18 1138   No Known Allergies .sox    Review of Systems  All other systems reviewed and are negative.      Objective:  Physical Exam Vitals signs reviewed.  Constitutional:      General: She is not in acute distress.    Appearance: Normal appearance. She is normal weight. She is not ill-appearing or toxic-appearing.  Cardiovascular:     Rate and Rhythm: Normal rate and regular rhythm.     Pulses: Normal pulses.     Heart sounds: Normal heart sounds. No murmur. No friction rub. No gallop.   Pulmonary:     Effort: Pulmonary effort is normal. No respiratory distress.     Breath sounds: Normal breath sounds. No stridor. No wheezing, rhonchi or rales.  Chest:     Chest wall: No tenderness.  Musculoskeletal:     Right lower leg: No edema.     Left lower leg: No edema.  Neurological:     Mental Status: She is alert.           Assessment & Plan:  Essential  hypertension  Need for immunization against influenza - Plan: Flu Vaccine QUAD High Dose(Fluad)  GAD (generalized anxiety disorder)  I believe the anxiety is contributing to her blood pressure.  First I want her to discontinue atenolol as this is redundant given the fact she is on carvedilol.  Second I want her to discontinue her clonidine patch.  Instead we will increase her clonidine to 0.2 mg 3 times daily.  Third I want her to start using Klonopin 0.5 mg p.o. every 8 hours as needed anxiety and insomnia.  Fourth I want her to discontinue trazodone.  We will reassess her blood pressure in 1 week or sooner if getting worse.  She received her flu shot today.

## 2018-10-09 ENCOUNTER — Other Ambulatory Visit: Payer: Self-pay

## 2018-10-09 MED ORDER — CLONIDINE HCL 0.2 MG PO TABS: 0.2000 mg | ORAL_TABLET | Freq: Three times a day (TID) | ORAL | 2 refills | Status: DC

## 2018-10-14 ENCOUNTER — Encounter: Payer: Self-pay | Admitting: Family Medicine

## 2018-10-28 ENCOUNTER — Other Ambulatory Visit: Payer: Self-pay | Admitting: Family Medicine

## 2018-10-30 ENCOUNTER — Encounter: Payer: Self-pay | Admitting: Family Medicine

## 2018-10-30 ENCOUNTER — Other Ambulatory Visit: Payer: Self-pay | Admitting: Family Medicine

## 2018-10-30 MED ORDER — HYDRALAZINE HCL 25 MG PO TABS: 50.0000 mg | ORAL_TABLET | Freq: Two times a day (BID) | ORAL | 1 refills | Status: DC

## 2018-11-23 ENCOUNTER — Other Ambulatory Visit: Payer: Self-pay | Admitting: Family Medicine

## 2018-12-15 ENCOUNTER — Encounter: Payer: Self-pay | Admitting: Family Medicine

## 2018-12-30 ENCOUNTER — Other Ambulatory Visit: Payer: Self-pay

## 2019-01-21 ENCOUNTER — Other Ambulatory Visit: Payer: Self-pay | Admitting: Family Medicine

## 2019-01-21 DIAGNOSIS — H6123 Impacted cerumen, bilateral: Secondary | ICD-10-CM | POA: Diagnosis not present

## 2019-01-21 DIAGNOSIS — H903 Sensorineural hearing loss, bilateral: Secondary | ICD-10-CM | POA: Diagnosis not present

## 2019-01-21 DIAGNOSIS — H90A22 Sensorineural hearing loss, unilateral, left ear, with restricted hearing on the contralateral side: Secondary | ICD-10-CM | POA: Diagnosis not present

## 2019-01-21 DIAGNOSIS — H9121 Sudden idiopathic hearing loss, right ear: Secondary | ICD-10-CM | POA: Diagnosis not present

## 2019-02-08 DIAGNOSIS — H90A21 Sensorineural hearing loss, unilateral, right ear, with restricted hearing on the contralateral side: Secondary | ICD-10-CM | POA: Diagnosis not present

## 2019-02-08 DIAGNOSIS — H9122 Sudden idiopathic hearing loss, left ear: Secondary | ICD-10-CM | POA: Diagnosis not present

## 2019-02-09 ENCOUNTER — Other Ambulatory Visit: Payer: Self-pay | Admitting: Family Medicine

## 2019-02-09 NOTE — Telephone Encounter (Signed)
Ok to refill??  Last office visit/ refill 10/08/2018, #1 refills.

## 2019-02-11 ENCOUNTER — Telehealth: Payer: Self-pay | Admitting: *Deleted

## 2019-02-11 NOTE — Telephone Encounter (Signed)
MyChart message sent to patient to verify insurance benefits for Prolia.   Awaiting reply.

## 2019-02-21 ENCOUNTER — Ambulatory Visit: Payer: PPO | Attending: Internal Medicine

## 2019-02-21 DIAGNOSIS — Z23 Encounter for immunization: Secondary | ICD-10-CM

## 2019-02-21 NOTE — Progress Notes (Signed)
   Covid-19 Vaccination Clinic  Name:  Brittany Levy    MRN: [id number] DOB: [birth year]  02/21/2019  [name] was observed post Covid-19 immunization for 15 minutes without incidence. She was provided with Vaccine Information Sheet and instruction to access the V-Safe system.   [name] was instructed to call 911 with any severe reactions post vaccine: Marland Kitchen Difficulty breathing  . Swelling of your face and throat  . A fast heartbeat  . A bad rash all over your body  . Dizziness and weakness   Patient was discharged at 1:26

## 2019-02-24 ENCOUNTER — Ambulatory Visit: Payer: PPO

## 2019-03-01 ENCOUNTER — Other Ambulatory Visit: Payer: Self-pay | Admitting: Family Medicine

## 2019-03-11 ENCOUNTER — Ambulatory Visit: Payer: PPO | Attending: Internal Medicine

## 2019-03-11 DIAGNOSIS — Z23 Encounter for immunization: Secondary | ICD-10-CM | POA: Insufficient documentation

## 2019-03-11 NOTE — Progress Notes (Signed)
   Covid-19 Vaccination Clinic  Name:  Brittany Levy    MRN: [id number] DOB: 06/14/1934  03/11/2019  [name] was observed post Covid-19 immunization for 15 minutes without incidence. She was provided with Vaccine Information Sheet and instruction to access the V-Safe system.   [name] was instructed to call 911 with any severe reactions post vaccine: Marland Kitchen Difficulty breathing  . Swelling of your face and throat  . A fast heartbeat  . A bad rash all over your body  . Dizziness and weakness    Immunizations Administered    Name Date Dose VIS Date Route   Pfizer COVID-19 Vaccine 03/11/2019  3:26 PM 0.3 mL 01/15/2019 Intramuscular   Manufacturer: [employer], [employer]   Lot: [id number]   NDC: [address]-8

## 2019-03-29 ENCOUNTER — Ambulatory Visit (INDEPENDENT_AMBULATORY_CARE_PROVIDER_SITE_OTHER): Payer: PPO

## 2019-03-29 ENCOUNTER — Other Ambulatory Visit: Payer: Self-pay

## 2019-03-29 DIAGNOSIS — M810 Age-related osteoporosis without current pathological fracture: Secondary | ICD-10-CM | POA: Diagnosis not present

## 2019-03-29 NOTE — Progress Notes (Signed)
Pt came in for prolia injecton. Pt tolerated well.

## 2019-04-27 ENCOUNTER — Other Ambulatory Visit: Payer: Self-pay | Admitting: Family Medicine

## 2019-05-16 ENCOUNTER — Encounter: Payer: Self-pay | Admitting: Family Medicine

## 2019-05-17 ENCOUNTER — Other Ambulatory Visit: Payer: Self-pay | Admitting: Family Medicine

## 2019-05-17 MED ORDER — CLONIDINE HCL 0.2 MG PO TABS: ORAL_TABLET | ORAL | 2 refills | Status: DC

## 2019-07-21 ENCOUNTER — Other Ambulatory Visit: Payer: Self-pay | Admitting: Family Medicine

## 2019-07-21 NOTE — Telephone Encounter (Signed)
Last office visit: 10/08/2018 Last refilled: 02/09/2019

## 2019-07-30 ENCOUNTER — Other Ambulatory Visit: Payer: Self-pay

## 2019-07-30 ENCOUNTER — Ambulatory Visit (INDEPENDENT_AMBULATORY_CARE_PROVIDER_SITE_OTHER): Payer: PPO | Admitting: Family Medicine

## 2019-07-30 VITALS — BP 140/80 | HR 78 | Temp 97.3°F

## 2019-07-30 DIAGNOSIS — I10 Essential (primary) hypertension: Secondary | ICD-10-CM | POA: Diagnosis not present

## 2019-07-30 NOTE — Progress Notes (Signed)
Subjective:    Patient ID: Brittany Levy, female    DOB: 1934-05-31, 85 y.o.   MRN: [id number]  HPI Patient is a very sweet 85 year old Caucasian female who is here today for follow-up of her hypertension.  She has a history of very hard to control blood pressure.  She is on numerous medications.  She recently talk with me at church.  She was feeling extremely dizzy and lightheaded in the middle of the day.  She had been taking clonidine in the morning as well as at night.  During the middle of the day her blood pressure would plummet and would be near 100 systolic.  Therefore I recommended the patient discontinue her clonidine medication in the morning.  She is still taking the clonidine in the evening.  However after discontinuing the clonidine in the morning, her blood pressures have been well controlled.  She states that her systolic pressures have all been less than 150.  I am willing to accept a blood pressure systolic near 150 given how difficult her blood pressure is to control and her advanced age.  Her blood pressure here today is 140/80.  She denies any chest pain shortness of breath or dyspnea on exertion. Past Medical History:  Diagnosis Date  . Chest pain   . GERD (gastroesophageal reflux disease)   . Hypertension   . Insomnia   . Osteoporosis of femur without pathological fracture    Past Surgical History:  Procedure Laterality Date  . ABDOMINAL HYSTERECTOMY    . BREAST BIOPSY Right 05/07/2016   FRAGMENTS OF LYMPH NODE   Current Outpatient Medications on File Prior to Visit  Medication Sig Dispense Refill  . aspirin 81 MG tablet Take 81 mg by mouth daily.    . calcium-vitamin D (OSCAL WITH D) 250-125 MG-UNIT per tablet Take 1 tablet by mouth 2 (two) times daily.    . carvedilol (COREG) 6.25 MG tablet TAKE 1 TABLET BY MOUTH TWICE A DAY WITH A MEAL 180 tablet 3  . clonazePAM (KLONOPIN) 0.5 MG tablet Take 1 tablet (0.5 mg total) by mouth 2 (two) times daily as needed for  anxiety. 30 tablet 1  . cloNIDine (CATAPRES) 0.2 MG tablet TAKE 1 TABLET BY MOUTH 3 TIMES DAILY. STOP ATENOLOL AND CLONIDINE PATCH 90 tablet 2  . felodipine (PLENDIL) 5 MG 24 hr tablet TAKE 1 TABLET BY MOUTH ONCE DAILY *NEED OFFICE VISIT AND LABS BEFORE MORE REFILLS* 90 tablet 3  . hydrALAZINE (APRESOLINE) 25 MG tablet TAKE 2 TABLETS BY MOUTH TWICE A DAY 360 tablet 1  . hydrochlorothiazide (HYDRODIURIL) 25 MG tablet TAKE 1 TABLET BY MOUTH ONCE A DAY 180 tablet 3  . irbesartan (AVAPRO) 300 MG tablet Take 1 tablet (300 mg total) by mouth daily. 90 tablet 3  . Omega-3 Fatty Acids (FISH OIL) 1200 MG CAPS Take 2 capsules by mouth.     Marland Kitchen omeprazole (PRILOSEC) 40 MG capsule TAKE 1 CAPSULE BY MOUTH ONCE DAILY 90 capsule 3  . potassium chloride (KLOR-CON) 10 MEQ tablet TAKE 1 TABLET BY MOUTH ONCE A DAY 90 tablet 3  . traZODone (DESYREL) 50 MG tablet Take 1 tablet (50 mg total) by mouth at bedtime as needed for sleep. 30 tablet 3   Current Facility-Administered Medications on File Prior to Visit  Medication Dose Route Frequency Provider Last Rate Last Admin  . denosumab (PROLIA) injection 60 mg  60 mg Subcutaneous Q6 months Donita Brooks, MD   60 mg at 03/29/19 [phone]  No Known Allergies .sox    Review of Systems  All other systems reviewed and are negative.      Objective:   Physical Exam Vitals reviewed.  Constitutional:      General: She is not in acute distress.    Appearance: Normal appearance. She is normal weight. She is not ill-appearing or toxic-appearing.  Cardiovascular:     Rate and Rhythm: Normal rate and regular rhythm.     Pulses: Normal pulses.     Heart sounds: Normal heart sounds. No murmur heard.  No friction rub. No gallop.   Pulmonary:     Effort: Pulmonary effort is normal. No respiratory distress.     Breath sounds: Normal breath sounds. No stridor. No wheezing, rhonchi or rales.  Chest:     Chest wall: No tenderness.  Musculoskeletal:     Right lower leg: No  edema.     Left lower leg: No edema.  Neurological:     Mental Status: She is alert.           Assessment & Plan:  Benign essential HTN - Plan: CBC with Differential/Platelet, COMPLETE METABOLIC PANEL WITH GFR, Lipid panel  Blood pressure today is acceptable for this patient.  We will continue to avoid clonidine during the daytime.  I instructed her to only take the clonidine during the daytime if her systolic blood pressure is greater than 160.  She is still taking it at night.  She denies any hypotensive episodes at night or in the morning.  Therefore we will continue this medication at the present time.  Otherwise I will make no changes in her medication.  Check a CBC, CMP, and fasting lipid panel.

## 2019-07-31 LAB — COMPLETE METABOLIC PANEL WITH GFR
AG Ratio: 1.6 (calc) (ref 1.0–2.5)
ALT: 11 U/L (ref 6–29)
AST: 14 U/L (ref 10–35)
Albumin: 4.3 g/dL (ref 3.6–5.1)
Alkaline phosphatase (APISO): 47 U/L (ref 37–153)
BUN/Creatinine Ratio: 22 (calc) (ref 6–22)
BUN: 20 mg/dL (ref 7–25)
CO2: 30 mmol/L (ref 20–32)
Calcium: 10 mg/dL (ref 8.6–10.4)
Chloride: 99 mmol/L (ref 98–110)
Creat: 0.89 mg/dL — ABNORMAL HIGH (ref 0.60–0.88)
GFR, Est African American: 69 mL/min/{1.73_m2} (ref >=60)
GFR, Est Non African American: 60 mL/min/{1.73_m2} (ref >=60)
Globulin: 2.7 g/dL (calc) (ref 1.9–3.7)
Glucose, Bld: 107 mg/dL — ABNORMAL HIGH (ref 65–99)
Potassium: 4.1 mmol/L (ref 3.5–5.3)
Sodium: 140 mmol/L (ref 135–146)
Total Bilirubin: 0.8 mg/dL (ref 0.2–1.2)
Total Protein: 7 g/dL (ref 6.1–8.1)

## 2019-07-31 LAB — LIPID PANEL
Cholesterol: 237 mg/dL — ABNORMAL HIGH (ref <200)
HDL: 72 mg/dL (ref >=50)
LDL Cholesterol (Calc): 142 mg/dL (calc) — ABNORMAL HIGH
Non-HDL Cholesterol (Calc): 165 mg/dL (calc) — ABNORMAL HIGH (ref <130)
Total CHOL/HDL Ratio: 3.3 (calc) (ref <5.0)
Triglycerides: 112 mg/dL (ref <150)

## 2019-07-31 LAB — CBC WITH DIFFERENTIAL/PLATELET
Absolute Monocytes: 746 cells/uL (ref 200–950)
Basophils Absolute: 53 cells/uL (ref 0–200)
Basophils Relative: 0.8 %
Eosinophils Absolute: 172 cells/uL (ref 15–500)
Eosinophils Relative: 2.6 %
HCT: 37.6 % (ref 35.0–45.0)
Hemoglobin: 12.6 g/dL (ref 11.7–15.5)
Lymphs Abs: 2488 cells/uL (ref 850–3900)
MCH: 29.8 pg (ref 27.0–33.0)
MCHC: 33.5 g/dL (ref 32.0–36.0)
MCV: 88.9 fL (ref 80.0–100.0)
MPV: 10.9 fL (ref 7.5–12.5)
Monocytes Relative: 11.3 %
Neutro Abs: 3142 cells/uL (ref 1500–7800)
Neutrophils Relative %: 47.6 %
Platelets: 215 10*3/uL (ref 140–400)
RBC: 4.23 10*6/uL (ref 3.80–5.10)
RDW: 12.7 % (ref 11.0–15.0)
Total Lymphocyte: 37.7 %
WBC: 6.6 10*3/uL (ref 3.8–10.8)

## 2019-08-03 ENCOUNTER — Other Ambulatory Visit: Payer: Self-pay

## 2019-08-03 DIAGNOSIS — E782 Mixed hyperlipidemia: Secondary | ICD-10-CM

## 2019-08-03 MED ORDER — ATORVASTATIN CALCIUM 20 MG PO TABS: 20.0000 mg | ORAL_TABLET | Freq: Every day | ORAL | 0 refills | Status: DC

## 2019-08-11 ENCOUNTER — Other Ambulatory Visit: Payer: Self-pay | Admitting: Family Medicine

## 2019-08-12 ENCOUNTER — Encounter: Payer: Self-pay | Admitting: Family Medicine

## 2019-08-17 ENCOUNTER — Other Ambulatory Visit: Payer: Self-pay

## 2019-08-17 ENCOUNTER — Ambulatory Visit (INDEPENDENT_AMBULATORY_CARE_PROVIDER_SITE_OTHER): Payer: PPO | Admitting: Family Medicine

## 2019-08-17 VITALS — BP 150/68 | HR 90 | Temp 98.0°F | Ht 63.0 in | Wt 114.0 lb

## 2019-08-17 DIAGNOSIS — J310 Chronic rhinitis: Secondary | ICD-10-CM | POA: Diagnosis not present

## 2019-08-17 DIAGNOSIS — J329 Chronic sinusitis, unspecified: Secondary | ICD-10-CM | POA: Diagnosis not present

## 2019-08-17 MED ORDER — FLUTICASONE PROPIONATE 50 MCG/ACT NA SUSP: 2.0000 | Freq: Every day | NASAL | 6 refills | Status: DC

## 2019-08-17 MED ORDER — AMOXICILLIN 875 MG PO TABS: 875.0000 mg | ORAL_TABLET | Freq: Two times a day (BID) | ORAL | 0 refills | Status: DC

## 2019-08-17 NOTE — Progress Notes (Signed)
Subjective:    Patient ID: Brittany Levy, female    DOB: 1934/02/15, 85 y.o.   MRN: [id number]  HPI Patient presents with a 2-week history of pain and pressure in her frontal sinuses bilaterally as well as behind the bridge of her nose.  She reports rhinorrhea and head congestion.  She denies any fever.  She does report rhinorrhea and postnasal drip.  She denies any chest pain or pleurisy or shortness of breath.  She has had increased indigestion and abdominal bloating recently however I do not believe that this is likely related to her sinuses.  She has not tried any medication for her sinuses yet although she does have tenderness to percussion in the frontal sinuses bilaterally Past Medical History:  Diagnosis Date   Chest pain    GERD (gastroesophageal reflux disease)    Hypertension    Insomnia    Osteoporosis of femur without pathological fracture    Past Surgical History:  Procedure Laterality Date   ABDOMINAL HYSTERECTOMY     BREAST BIOPSY Right 05/07/2016   FRAGMENTS OF LYMPH NODE   Current Outpatient Medications on File Prior to Visit  Medication Sig Dispense Refill   aspirin 81 MG tablet Take 81 mg by mouth daily.     atorvastatin (LIPITOR) 20 MG tablet Take 1 tablet (20 mg total) by mouth daily. 90 tablet 0   calcium-vitamin D (OSCAL WITH D) 250-125 MG-UNIT per tablet Take 1 tablet by mouth 2 (two) times daily.     carvedilol (COREG) 6.25 MG tablet TAKE 1 TABLET BY MOUTH TWICE A DAY WITH A MEAL 180 tablet 3   clonazePAM (KLONOPIN) 0.5 MG tablet Take 1 tablet (0.5 mg total) by mouth 2 (two) times daily as needed for anxiety. 30 tablet 1   cloNIDine (CATAPRES) 0.2 MG tablet TAKE 1 TABLET BY MOUTH 3 TIMES DAILY. STOP ATENOLOL AND CLONIDINE PATCH 90 tablet 2   felodipine (PLENDIL) 5 MG 24 hr tablet TAKE 1 TABLET BY MOUTH ONCE DAILY *NEED OFFICE VISIT AND LABS BEFORE MORE REFILLS* 90 tablet 3   hydrALAZINE (APRESOLINE) 25 MG tablet TAKE 2 TABLETS BY MOUTH TWICE  A DAY 360 tablet 1   hydrochlorothiazide (HYDRODIURIL) 25 MG tablet TAKE 1 TABLET BY MOUTH ONCE A DAY 180 tablet 3   irbesartan (AVAPRO) 300 MG tablet Take 1 tablet (300 mg total) by mouth daily. 90 tablet 3   Omega-3 Fatty Acids (FISH OIL) 1200 MG CAPS Take 2 capsules by mouth.      omeprazole (PRILOSEC) 40 MG capsule TAKE 1 CAPSULE BY MOUTH ONCE DAILY 90 capsule 3   potassium chloride (KLOR-CON) 10 MEQ tablet TAKE 1 TABLET BY MOUTH ONCE A DAY 90 tablet 3   traZODone (DESYREL) 50 MG tablet Take 1 tablet (50 mg total) by mouth at bedtime as needed for sleep. 30 tablet 3   Current Facility-Administered Medications on File Prior to Visit  Medication Dose Route Frequency Provider Last Rate Last Admin   denosumab (PROLIA) injection 60 mg  60 mg Subcutaneous Q6 months Donita Brooks, MD   60 mg at 03/29/19 0815   No Known Allergies .sox    Review of Systems  All other systems reviewed and are negative.      Objective:   Physical Exam Vitals reviewed.  Constitutional:      General: She is not in acute distress.    Appearance: Normal appearance. She is normal weight. She is not ill-appearing or toxic-appearing.  HENT:  Nose: Mucosal edema and rhinorrhea present.     Right Sinus: Frontal sinus tenderness present.     Left Sinus: Frontal sinus tenderness present.  Cardiovascular:     Rate and Rhythm: Normal rate and regular rhythm.     Pulses: Normal pulses.     Heart sounds: Normal heart sounds. No murmur heard.  No friction rub. No gallop.   Pulmonary:     Effort: Pulmonary effort is normal. No respiratory distress.     Breath sounds: Normal breath sounds. No stridor. No wheezing, rhonchi or rales.  Chest:     Chest wall: No tenderness.  Musculoskeletal:     Right lower leg: No edema.     Left lower leg: No edema.  Neurological:     Mental Status: She is alert.           Assessment & Plan:  Rhinosinusitis  I believe the patient likely has a sinus  infection.  Begin amoxicillin 875 mg twice daily for 10 days coupled with Flonase 2 sprays each nostril daily.  Recheck in 1 week if no better or sooner if worse.  She is also having occasional abdominal bloating and indigestion.  I recommended trying omeprazole which seemed to help her symptoms over the weekend when she tried some of her husband's.

## 2019-08-26 ENCOUNTER — Encounter: Payer: Self-pay | Admitting: *Deleted

## 2019-09-01 DIAGNOSIS — H90A21 Sensorineural hearing loss, unilateral, right ear, with restricted hearing on the contralateral side: Secondary | ICD-10-CM | POA: Diagnosis not present

## 2019-09-01 DIAGNOSIS — H6123 Impacted cerumen, bilateral: Secondary | ICD-10-CM | POA: Diagnosis not present

## 2019-09-13 ENCOUNTER — Encounter: Payer: Self-pay | Admitting: Family Medicine

## 2019-09-13 ENCOUNTER — Other Ambulatory Visit: Payer: Self-pay | Admitting: Family Medicine

## 2019-09-13 ENCOUNTER — Telehealth: Payer: Self-pay | Admitting: Family Medicine

## 2019-09-13 NOTE — Progress Notes (Signed)
  Chronic Care Management   Note  09/13/2019 Name: Brittany Levy MRN: [id number] DOB: Nov 05, 1934  Brittany Levy is a 85 y.o. year old female who is a primary care patient of Pickard, Priscille Heidelberg, MD. I reached out to Kathe Mariner by phone today in response to a referral sent by [name]'s PCP, Donita Brooks, MD.   [name] was given information about Chronic Care Management services today including:  1. CCM service includes personalized support from designated clinical staff supervised by her physician, including individualized plan of care and coordination with other care providers 2. 24/7 contact phone numbers for assistance for urgent and routine care needs. 3. Service will only be billed when office clinical staff spend 20 minutes or more in a month to coordinate care. 4. Only one practitioner may furnish and bill the service in a calendar month. 5. The patient may stop CCM services at any time (effective at the end of the month) by phone call to the office staff.   Patient agreed to services and verbal consent obtained.   Follow up plan:   Carley Perdue [employer] Scheduler

## 2019-09-28 ENCOUNTER — Ambulatory Visit (INDEPENDENT_AMBULATORY_CARE_PROVIDER_SITE_OTHER): Payer: PPO

## 2019-09-28 ENCOUNTER — Other Ambulatory Visit: Payer: Self-pay

## 2019-09-28 DIAGNOSIS — M810 Age-related osteoporosis without current pathological fracture: Secondary | ICD-10-CM

## 2019-09-28 NOTE — Progress Notes (Signed)
Pt came in prolia injection. Administered in R arm. Tolerated well.

## 2019-09-30 ENCOUNTER — Ambulatory Visit (INDEPENDENT_AMBULATORY_CARE_PROVIDER_SITE_OTHER): Payer: PPO | Admitting: Family Medicine

## 2019-09-30 ENCOUNTER — Other Ambulatory Visit: Payer: Self-pay

## 2019-09-30 VITALS — BP 160/80 | HR 76 | Temp 98.3°F | Ht 63.0 in | Wt 114.0 lb

## 2019-09-30 DIAGNOSIS — S40261A Insect bite (nonvenomous) of right shoulder, initial encounter: Secondary | ICD-10-CM | POA: Diagnosis not present

## 2019-09-30 DIAGNOSIS — W57XXXA Bitten or stung by nonvenomous insect and other nonvenomous arthropods, initial encounter: Secondary | ICD-10-CM | POA: Diagnosis not present

## 2019-09-30 NOTE — Progress Notes (Signed)
Subjective:    Patient ID: Brittany Levy, female    DOB: 1934-09-04, 85 y.o.   MRN: [id number]  Patient was bit on the posterior aspect of her right shoulder by a tick about 3 weeks ago.  There is still approximately a 5 mm erythematous papule in the area where the tick bit her.  There is no spreading red ring.  There is no warmth.  There is no pain.  It is a little bit itchy.  She denies any fevers or chills or headaches or myalgias or rashes. Past Medical History:  Diagnosis Date  . Chest pain   . GERD (gastroesophageal reflux disease)   . Hypertension   . Insomnia   . Osteoporosis of femur without pathological fracture    Past Surgical History:  Procedure Laterality Date  . ABDOMINAL HYSTERECTOMY    . BREAST BIOPSY Right 05/07/2016   FRAGMENTS OF LYMPH NODE   Current Outpatient Medications on File Prior to Visit  Medication Sig Dispense Refill  . aspirin 81 MG tablet Take 81 mg by mouth daily.    Marland Kitchen atorvastatin (LIPITOR) 20 MG tablet Take 1 tablet (20 mg total) by mouth daily. 90 tablet 0  . calcium-vitamin D (OSCAL WITH D) 250-125 MG-UNIT per tablet Take 1 tablet by mouth 2 (two) times daily.    . carvedilol (COREG) 6.25 MG tablet TAKE 1 TABLET BY MOUTH TWICE A DAY WITH A MEAL 180 tablet 3  . clonazePAM (KLONOPIN) 0.5 MG tablet Take 1 tablet (0.5 mg total) by mouth 2 (two) times daily as needed for anxiety. 30 tablet 1  . cloNIDine (CATAPRES) 0.2 MG tablet TAKE 1 TABLET BY MOUTH 3 TIMES DAILY. STOP ATENOLOL AND CLONIDINE PATCH 90 tablet 2  . felodipine (PLENDIL) 5 MG 24 hr tablet TAKE 1 TABLET BY MOUTH ONCE DAILY 90 tablet 1  . fluticasone (FLONASE) 50 MCG/ACT nasal spray Place 2 sprays into both nostrils daily. 16 g 6  . hydrALAZINE (APRESOLINE) 25 MG tablet TAKE 2 TABLETS BY MOUTH TWICE A DAY 360 tablet 1  . hydrochlorothiazide (HYDRODIURIL) 25 MG tablet TAKE 1 TABLET BY MOUTH ONCE A DAY 180 tablet 3  . irbesartan (AVAPRO) 300 MG tablet TAKE 1 TABLET BY MOUTH ONCE A DAY 90  tablet 1  . Omega-3 Fatty Acids (FISH OIL) 1200 MG CAPS Take 2 capsules by mouth.     Marland Kitchen omeprazole (PRILOSEC) 40 MG capsule TAKE 1 CAPSULE BY MOUTH ONCE DAILY 90 capsule 3  . potassium chloride (KLOR-CON) 10 MEQ tablet TAKE 1 TABLET BY MOUTH ONCE A DAY 90 tablet 3  . traZODone (DESYREL) 50 MG tablet Take 1 tablet (50 mg total) by mouth at bedtime as needed for sleep. 30 tablet 3   Current Facility-Administered Medications on File Prior to Visit  Medication Dose Route Frequency Provider Last Rate Last Admin  . denosumab (PROLIA) injection 60 mg  60 mg Subcutaneous Q6 months Donita Brooks, MD   60 mg at 09/28/19 1030   No Known Allergies .sox    Review of Systems  All other systems reviewed and are negative.      Objective:   Physical Exam Vitals reviewed.  Constitutional:      General: She is not in acute distress.    Appearance: Normal appearance. She is normal weight. She is not ill-appearing or toxic-appearing.  HENT:     Nose: Mucosal edema present.     Right Sinus: Frontal sinus tenderness present.     Left Sinus:  Frontal sinus tenderness present.  Cardiovascular:     Rate and Rhythm: Normal rate and regular rhythm.     Pulses: Normal pulses.     Heart sounds: Normal heart sounds. No murmur heard.  No friction rub. No gallop.   Pulmonary:     Effort: Pulmonary effort is normal. No respiratory distress.     Breath sounds: Normal breath sounds. No stridor. No wheezing, rhonchi or rales.  Chest:     Chest wall: No tenderness.  Musculoskeletal:     Right lower leg: No edema.     Left lower leg: No edema.  Skin:    Findings: Lesion present.       Neurological:     Mental Status: She is alert.           Assessment & Plan:  Tick bite, initial encounter  I believe this is a benign tick bite.  There is no evidence of [facility] spotted fever or Lyme disease.  Reassurance is provided to the patient.  Can use cortisone cream for itching as needed.

## 2019-10-12 ENCOUNTER — Other Ambulatory Visit: Payer: Self-pay | Admitting: Family Medicine

## 2019-10-18 ENCOUNTER — Other Ambulatory Visit: Payer: Self-pay | Admitting: Family Medicine

## 2019-10-22 NOTE — Chronic Care Management (AMB) (Addendum)
Chronic Care Management Pharmacy  Name: Brittany Levy  MRN: [id number] DOB: [birth year]  Chief Complaint/ HPI  Brittany Levy,  85 y.o. , female presents for their Initial CCM visit with the clinical pharmacist In office.  PCP : Brittany Frizzle, MD  Their chronic conditions include:  HTN, GERD, Osteoporosis, GAD, Insomnia.  Office Visits: 09/30/2019 (Brittany Levy) - tick bite No evidence of Lyme disease or [facility] Spotted fever Use cortisone cream prn itching  08/17/2019 (Brittany Levy) -  2 week history of pain and pressure in frontal sinuses Has not tried any medications at this point Sinus infection - given Amox 897m  Bid x 10 Recommended omeprazole for abdominal bloating - patient previously tried her husbands  07/30/2019 (Brittany Levy -  History of difficult to control BP Only taking clonidine at nighttime to avoid hypotension Consult Visit: none recent  Medications: Outpatient Encounter Medications as of 10/27/2019  Medication Sig   aspirin 81 MG tablet Take 81 mg by mouth daily.   atorvastatin (LIPITOR) 20 MG tablet TAKE 1 TABLET BY MOUTH ONCE A DAY   calcium-vitamin D (OSCAL WITH D) 250-125 MG-UNIT per tablet Take 1 tablet by mouth 2 (two) times daily.   carvedilol (COREG) 6.25 MG tablet TAKE 1 TABLET BY MOUTH TWICE A DAY WITH A MEAL   cloNIDine (CATAPRES) 0.2 MG tablet TAKE 1 TABLET BY MOUTH 3 TIMES DAILY. STOP ATENOLOL AND CLONIDINE PATCH.   felodipine (PLENDIL) 5 MG 24 hr tablet TAKE 1 TABLET BY MOUTH ONCE DAILY   fluticasone (FLONASE) 50 MCG/ACT nasal spray Place 2 sprays into both nostrils daily.   hydrALAZINE (APRESOLINE) 25 MG tablet TAKE 2 TABLETS BY MOUTH TWICE A DAY   hydrochlorothiazide (HYDRODIURIL) 25 MG tablet TAKE 1 TABLET BY MOUTH ONCE A DAY   irbesartan (AVAPRO) 300 MG tablet TAKE 1 TABLET BY MOUTH ONCE A DAY   Omega-3 Fatty Acids (FISH OIL) 1200 MG CAPS Take 2 capsules by mouth.    omeprazole (PRILOSEC) 40 MG capsule TAKE 1 CAPSULE BY MOUTH ONCE  DAILY   potassium chloride (KLOR-CON) 10 MEQ tablet TAKE 1 TABLET BY MOUTH ONCE A DAY   clonazePAM (KLONOPIN) 0.5 MG tablet Take 1 tablet (0.5 mg total) by mouth 2 (two) times daily as needed for anxiety. (Patient not taking: Reported on 10/27/2019)   traZODone (DESYREL) 50 MG tablet Take 1 tablet (50 mg total) by mouth at bedtime as needed for sleep. (Patient not taking: Reported on 10/27/2019)   Facility-Administered Encounter Medications as of 10/27/2019  Medication   denosumab (PROLIA) injection 60 mg     Current Diagnosis/Assessment:   FEmergency planning/management officerStrain: Low Risk    Difficulty of Paying Living Expenses: Not very hard    Goals Addressed             This Visit's Progress    Pharmacy Care Plan:       CARE PLAN ENTRY (see longitudinal plan of care for additional care plan information)  Current Barriers:  Chronic Disease Management support, education, and care coordination needs related to Hypertension, Anxiety, and insomnia   Hypertension BP Readings from Last 3 Encounters:  09/30/19 (!) 160/80  08/17/19 (!) 150/68  07/30/19 140/80   Pharmacist Clinical Goal(s): Over the next 14 days, patient will work with PharmD and providers to achieve BP goal <140/90 Current regimen:  Carvedilol 6.25mbid Clonidine 0.65m1mid except one-half tablet in the am Felodipine 5mg24mily Hydralazine 25mg365mablets po bid HCTZ 25mg 19msartan 300mg d565m Interventions:  Reviewed home monitoring of BP Recommended night time dosing of losartan Counseled on medication adherence Recommended physical activity and interaction whenever possible Patient self care activities - Over the next 14 days, patient will: Check BP daily, document, and provide at future appointments Ensure daily salt intake < 2300 mg/day Trial losartan at bedtime for 1 week and follow up with provider  Insomnia Pharmacist Clinical Goal(s) Over the next 14 days, patient will work with PharmD and providers to  optimize medication and minimize symptoms of insomnia. Current regimen:  No medications Interventions: Reviewed current sleep patterns Recommended appointment with PCP to discuss resuming previous medications Patient self care activities - Over the next 14 days, patient will: Notify providers with any new or worsening symptoms   GAD Pharmacist Clinical Goal(s) Over the next 14 days, patient will work with PharmD and providers to optimize medication and minimize symptoms of anxiety. Current regimen:  No medications at this time Interventions: Reviewed current symptoms of anxiety Comprehensive medication review Patient self care activities - Over the next 14 days, patient will: Notify providers of any new or worsening symptoms Initial goal documentation         Hypertension   BP goal is:  <140/90  Office blood pressures are  BP Readings from Last 3 Encounters:  09/30/19 (!) 160/80  08/17/19 (!) 150/68  07/30/19 140/80   Patient checks BP at home once or twice daily Patient home BP readings are ranging: she reports some morning readings of 161W systolic  Patient has failed these meds in the past: none noted Patient is currently uncontrolled on the following medications:  Carvedilol 6.51m bid Clonidine 0.216mtid except one-half tablet in the am Felodipine 46m69maily Hydralazine 246m59mtablets po bid HCTZ 246mg56mesartan 300mg 31my Patient takes multiple medications for BP.  She is very aware of her BP and adherent with all medications.  Takes clonidine on an as needed basis of systolic BP > 150.  960 reports this morning 160 sy454lic happens about half of the days of each week.  Recommended a trial of switching irbesartan 300mg t67mght time dosing, she is to monitor BP and record and call me in a week to let me know if this is helping morning numbers.  Plan  Continue current medications, trial irbesartan 300mg hs18m one week to see how this effects BP.    Hyperlipidemia   LDL goal < 100  Lipid Panel     Component Value Date/Time   CHOL 237 (H) 07/30/2019 0911   TRIG 112 07/30/2019 0911   HDL 72 07/30/2019 0911   LDLCALC 142 (H) 07/30/2019 0911    Hepatic Function Latest Ref Rng & Units 07/30/2019 08/18/2018 04/23/2017  Total Protein 6.1 - 8.1 g/dL 7.0 6.6 6.8  Albumin 3.6 - 5.1 g/dL - - -  AST 10 - 35 U/L _0 ALT 6 - 29 U/L _1 Alk Phosphatase 33 - 130 U/L - - -  Total Bilirubin 0.2 - 1.2 mg/dL 0.8 0.5 0.5     The ASCVD Risk score (Goff [place]., 2013) failed to calculate for the following reasons:   The 2013 ASCVD risk score is only valid for ages 40 to 7972 Pat32nt has failed these meds in past: none noted Patient is currently uncontrolled on the following medications:  Atorvastatin 20mg dai87mPatient concerned about adverse effects she read in handout about this medication.  Counseled her on benefits and efficacy  of cholesterol lowering.  She has been compliant with medication she was just concerned over something she received.  She plans to continue medication after our discussion.  Encouraged her the better her diet is the better her cholesterol will be and she may be able to come off of this medication all together.  Plan  Continue current medications    Osteopenia / Osteoporosis   Last DEXA Scan: 2018    T-Score total hip: -2.7    No results found for: VD25OH   Patient is a candidate for pharmacologic treatment due to T-Score < -2.5 in total hip   Patient has failed these meds in past: none noted Patient is currently controlled on the following medications:  Prolia 73m   We discussed:  Recommend [phone] units of vitamin D daily. Recommend 1200 mg of calcium daily from dietary and supplemental sources. Recommend weight-bearing and muscle strengthening exercises for building and maintaining bone density.   Plan  Continue current medications GAD   Patient has failed these meds in past:  none noted Patient is currently uncontrolled on the following medications:  No medications currently  She is not taking clonazepam prescribed earlier.  She does report that she feels a lot of her issues are anxiety related due to COVID-19 and not being able to leave her house.  Encouraged her to try walking and to get back to doing some of the things she can do safely.  The more she stays active I think the better her anxiety and other aspects of her health will be.  Plan Contact providers with symptom change or progression. GERD   Patient has failed these meds in past: none ntoed Patient is currently controlled on the following medications:  Omeprazole 474mdaily  No symptoms of acid reflux.  Plan  Continue current medications Insomnia   Patient has failed these meds in past: none noted Patient is currently uncontrolled on the following medications: none at this time  She does admit to interrupted sleep almost nightly.  She no longer is taking the trazodone.  Encouraged to schedule a visit with Dr. PiDennard Schaumanno discuss this and possibly restart some of these sleep/anxiety medications if needed.  Plan F/u with PCP as needed. Vaccines   Reviewed and discussed patient's vaccination history.    Immunization History  Administered Date(s) Administered   Fluad Quad(high Dose 65+) 10/08/2018   Influenza Split 10/18/2010   Influenza,inj,Quad PF,6+ Mos 10/08/2012, 10/06/2013, 10/09/2016, 11/14/2017   Influenza-Unspecified 03/09/2015, 12/07/2015   PFIZER SARS-COV-2 Vaccination 02/21/2019, 03/11/2019   Pneumococcal Conjugate-13 02/12/2013   Pneumococcal Polysaccharide-23 02/05/1999, 02/15/2014   Zoster 09/18/2011    Plan  Recommended patient receive Shingrix vaccine in. Medication Management   Miscellaneous medications: none OTC's:  Fish oil Oscal + D ASA 8178matient currently uses [employer] Patient reports using pill box method to organize medications and  promote adherence. Patient denies missed doses of medication.   ChrBeverly MilchharmD Clinical Pharmacist [phone] have collaborated with the care management provider regarding care management and care coordination activities outlined in this encounter and have reviewed this encounter including documentation in the note and care plan. I am certifying that I agree with the content of this note and encounter as supervising physician.

## 2019-10-25 ENCOUNTER — Telehealth: Payer: Self-pay | Admitting: Pharmacist

## 2019-10-25 NOTE — Progress Notes (Signed)
Chronic Care Management Pharmacy Assistant   Name: ANYSIA Levy  MRN: [id number] DOB: 04/02/1934  Reason for Encounter: Medication Review  Patient Questions:  1.  Have you seen Brittany other providers since your last visit? No  2.  Brittany changes in your medicines or health? Yes, patient was put on amoxicillin and flonase short-term for Brittany sinus infection. Patient was also recently bit by Brittany tick.   Brittany Levy,  85 y.o. , female presents for their Initial CCM visit with the clinical pharmacist via telephone.  PCP : Donita Brooks, MD  Allergies:  No Known Allergies  Medications: Outpatient Encounter Medications as of 10/25/2019  Medication Sig  . aspirin 81 MG tablet Take 81 mg by mouth daily.  Marland Kitchen atorvastatin (LIPITOR) 20 MG tablet TAKE 1 TABLET BY MOUTH ONCE Brittany DAY  . calcium-vitamin D (OSCAL WITH D) 250-125 MG-UNIT per tablet Take 1 tablet by mouth 2 (two) times daily.  . carvedilol (COREG) 6.25 MG tablet TAKE 1 TABLET BY MOUTH TWICE Brittany DAY WITH Brittany MEAL  . clonazePAM (KLONOPIN) 0.5 MG tablet Take 1 tablet (0.5 mg total) by mouth 2 (two) times daily as needed for anxiety.  . cloNIDine (CATAPRES) 0.2 MG tablet TAKE 1 TABLET BY MOUTH 3 TIMES DAILY. STOP ATENOLOL AND CLONIDINE PATCH.  . felodipine (PLENDIL) 5 MG 24 hr tablet TAKE 1 TABLET BY MOUTH ONCE DAILY  . fluticasone (FLONASE) 50 MCG/ACT nasal spray Place 2 sprays into both nostrils daily.  . hydrALAZINE (APRESOLINE) 25 MG tablet TAKE 2 TABLETS BY MOUTH TWICE Brittany DAY  . hydrochlorothiazide (HYDRODIURIL) 25 MG tablet TAKE 1 TABLET BY MOUTH ONCE Brittany DAY  . irbesartan (AVAPRO) 300 MG tablet TAKE 1 TABLET BY MOUTH ONCE Brittany DAY  . Omega-3 Fatty Acids (FISH OIL) 1200 MG CAPS Take 2 capsules by mouth.   Marland Kitchen omeprazole (PRILOSEC) 40 MG capsule TAKE 1 CAPSULE BY MOUTH ONCE DAILY  . potassium chloride (KLOR-CON) 10 MEQ tablet TAKE 1 TABLET BY MOUTH ONCE Brittany DAY  . traZODone (DESYREL) 50 MG tablet Take 1 tablet (50 mg total) by mouth at bedtime  as needed for sleep.   Facility-Administered Encounter Medications as of 10/25/2019  Medication  . denosumab (PROLIA) injection 60 mg    Current Diagnosis: Patient Active Problem List   Diagnosis Date Noted  . GAD (generalized anxiety disorder) 09/08/2018  . Osteoporosis 06/25/2016  . Insomnia   . Dyspnea 05/12/2012  . Hypertension   . GERD (gastroesophageal reflux disease)     Goals Addressed   None     Follow-Up:  [facility]    Have you seen Brittany other providers since your last visit? No, just PCP.  Brittany changes in your medications or health? Yes, patient was put on amoxicillin and flonase short-term for Brittany sinus infection. Patient was also recently bit by Brittany tick.  Brittany side effects from Brittany medications? No current side effects.  Do you have an symptoms or problems not managed by your medications? No additional problems.   Brittany concerns about your health right now? No current concerns.  Has your provider asked that you check blood pressure, blood sugar, or follow special diet at home? Yes, patient states she checks her blood pressure Brittany few times day or Brittany few times every other day. It depends on the day and the week.  Do you get Brittany type of exercise on Brittany regular basis? Patient states she was working out at [employer]  doing group exercises/classes until 2 months ago. Patient states she stopped due to sinus issues, causing her to get scared thinking she might've caught Covid.  Can you think of Brittany goal you would like to reach for your health? Patient would like to get her blood pressure down and well-controlled so she can get off of her medication.  Do you have Brittany problems getting your medications? no.  Is there anything that you would like to discuss during the appointment? Patient states she hasn't been sleeping well. She wakes up at 3 AM most nights. Patient doesn't know when it started. Patient stated that is just her pattern now.  Please bring  medications and supplements to appointment

## 2019-10-27 ENCOUNTER — Ambulatory Visit: Payer: PPO

## 2019-10-27 DIAGNOSIS — I10 Essential (primary) hypertension: Secondary | ICD-10-CM

## 2019-10-27 DIAGNOSIS — F411 Generalized anxiety disorder: Secondary | ICD-10-CM

## 2019-10-27 DIAGNOSIS — F5101 Primary insomnia: Secondary | ICD-10-CM

## 2019-10-27 NOTE — Patient Instructions (Addendum)
Visit Information Thank you for meeting with me today!  I look forward to working with you to help you meet all of your healthcare goals and answer any questions you may have.  Feel free to contact me anytime!  Goals Addressed            This Visit's Progress   . Pharmacy Care Plan:       CARE PLAN ENTRY (see longitudinal plan of care for additional care plan information)  Current Barriers:  . Chronic Disease Management support, education, and care coordination needs related to Hypertension, Anxiety, and insomnia   Hypertension BP Readings from Last 3 Encounters:  09/30/19 (!) 160/80  08/17/19 (!) 150/68  07/30/19 140/80   . Pharmacist Clinical Goal(s): o Over the next 14 days, patient will work with PharmD and providers to achieve BP goal <140/90 . Current regimen:  . Carvedilol 6.25mg  bid . Clonidine 0.2mg  tid except one-half tablet in the am . Felodipine 5mg  daily . Hydralazine 25mg  2 tablets po bid . HCTZ 25mg  . Irbesartan 300mg  daily . Interventions: o Reviewed home monitoring of BP o Recommended night time dosing of losartan o Counseled on medication adherence o Recommended physical activity and interaction whenever possible . Patient self care activities - Over the next 14 days, patient will: o Check BP daily, document, and provide at future appointments o Ensure daily salt intake < 2300 mg/day o Trial losartan at bedtime for 1 week and follow up with provider  Insomnia . Pharmacist Clinical Goal(s) o Over the next 14 days, patient will work with PharmD and providers to optimize medication and minimize symptoms of insomnia. . Current regimen:  o No medications . Interventions: o Reviewed current sleep patterns o Recommended appointment with PCP to discuss resuming previous medications . Patient self care activities - Over the next 14 days, patient will: o Notify providers with any new or worsening symptoms   GAD . Pharmacist Clinical Goal(s) o Over the  next 14 days, patient will work with PharmD and providers to optimize medication and minimize symptoms of anxiety. . Current regimen:  o No medications at this time . Interventions: o Reviewed current symptoms of anxiety o Comprehensive medication review . Patient self care activities - Over the next 14 days, patient will: o Notify providers of any new or worsening symptoms Initial goal documentation        Ms. Curb was given information about Chronic Care Management services today including:  1. CCM service includes personalized support from designated clinical staff supervised by her physician, including individualized plan of care and coordination with other care providers 2. 24/7 contact phone numbers for assistance for urgent and routine care needs. 3. Standard insurance, coinsurance, copays and deductibles apply for chronic care management only during months in which we provide at least 20 minutes of these services. Most insurances cover these services at 100%, however patients may be responsible for any copay, coinsurance and/or deductible if applicable. This service may help you avoid the need for more expensive face-to-face services. 4. Only one practitioner may furnish and bill the service in a calendar month. 5. The patient may stop CCM services at any time (effective at the end of the month) by phone call to the office staff.  Patient agreed to services and verbal consent obtained.   The patient verbalized understanding of instructions provided today and agreed to receive a mailed copy of patient instruction and/or educational materials. The patient will follow up with CCM team in 7  days.  [name], PharmD Clinical Pharmacist [facility] [phone]   Managing Anxiety, Adult After being diagnosed with an anxiety disorder, you may be relieved to know why you have felt or behaved a certain way. You may also feel overwhelmed about the treatment  ahead and what it will mean for your life. With care and support, you can manage this condition and recover from it. How to manage lifestyle changes Managing stress and anxiety  Stress is your body's reaction to life changes and events, both good and bad. Most stress will last just a few hours, but stress can be ongoing and can lead to more than just stress. Although stress can play a major role in anxiety, it is not the same as anxiety. Stress is usually caused by something external, such as a deadline, test, or competition. Stress normally passes after the triggering event has ended.  Anxiety is caused by something internal, such as imagining a terrible outcome or worrying that something will go wrong that will devastate you. Anxiety often does not go away even after the triggering event is over, and it can become long-term (chronic) worry. It is important to understand the differences between stress and anxiety and to manage your stress effectively so that it does not lead to an anxious response. Talk with your health care provider or a counselor to learn more about reducing anxiety and stress. He or she may suggest tension reduction techniques, such as:  Music therapy. This can include creating or listening to music that you enjoy and that inspires you.  Mindfulness-based meditation. This involves being aware of your normal breaths while not trying to control your breathing. It can be done while sitting or walking.  Centering prayer. This involves focusing on a word, phrase, or sacred image that means something to you and brings you peace.  Deep breathing. To do this, expand your stomach and inhale slowly through your nose. Hold your breath for 3-5 seconds. Then exhale slowly, letting your stomach muscles relax.  Self-talk. This involves identifying thought patterns that lead to anxiety reactions and changing those patterns.  Muscle relaxation. This involves tensing muscles and then relaxing  them. Choose a tension reduction technique that suits your lifestyle and personality. These techniques take time and practice. Set aside 5-15 minutes a day to do them. Therapists can offer counseling and training in these techniques. The training to help with anxiety may be covered by some insurance plans. Other things you can do to manage stress and anxiety include:  Keeping a stress/anxiety diary. This can help you learn what triggers your reaction and then learn ways to manage your response.  Thinking about how you react to certain situations. You may not be able to control everything, but you can control your response.  Making time for activities that help you relax and not feeling guilty about spending your time in this way.  Visual imagery and yoga can help you stay calm and relax.  Medicines Medicines can help ease symptoms. Medicines for anxiety include:  Anti-anxiety drugs.  Antidepressants. Medicines are often used as a primary treatment for anxiety disorder. Medicines will be prescribed by a health care provider. When used together, medicines, psychotherapy, and tension reduction techniques may be the most effective treatment. Relationships Relationships can play a big part in helping you recover. Try to spend more time connecting with trusted friends and family members. Consider going to couples counseling, taking family education classes, or going to family therapy.  Therapy can help you and others better understand your condition. How to recognize changes in your anxiety Everyone responds differently to treatment for anxiety. Recovery from anxiety happens when symptoms decrease and stop interfering with your daily activities at home or work. This may mean that you will start to:  Have better concentration and focus. Worry will interfere less in your daily thinking.  Sleep better.  Be less irritable.  Have more energy.  Have improved memory. It is important to recognize  when your condition is getting worse. Contact your health care provider if your symptoms interfere with home or work and you feel like your condition is not improving. Follow these instructions at home: Activity  Exercise. Most adults should do the following: ? Exercise for at least 150 minutes each week. The exercise should increase your heart rate and make you sweat (moderate-intensity exercise). ? Strengthening exercises at least twice a week.  Get the right amount and quality of sleep. Most adults need 7-9 hours of sleep each night. Lifestyle   Eat a healthy diet that includes plenty of vegetables, fruits, whole grains, low-fat dairy products, and lean protein. Do not eat a lot of foods that are high in solid fats, added sugars, or salt.  Make choices that simplify your life.  Do not use any products that contain nicotine or tobacco, such as cigarettes, e-cigarettes, and chewing tobacco. If you need help quitting, ask your health care provider.  Avoid caffeine, alcohol, and certain over-the-counter cold medicines. These may make you feel worse. Ask your pharmacist which medicines to avoid. General instructions  Take over-the-counter and prescription medicines only as told by your health care provider.  Keep all follow-up visits as told by your health care provider. This is important. Where to find support You can get help and support from these sources:  Self-help groups.  Online and [employer].  A trusted spiritual leader.  Couples counseling.  Family education classes.  Family therapy. Where to find more information You may find that joining a support group helps you deal with your anxiety. The following sources can help you locate counselors or support groups near you:  Mental Health America: www.mentalhealthamerica.net  Anxiety and Depression Association of [place] (ADAA): ProgramCam.de  [employer] on Mental Illness (NAMI): www.nami.org Contact  a health care provider if you:  Have a hard time staying focused or finishing daily tasks.  Spend many hours a day feeling worried about everyday life.  Become exhausted by worry.  Start to have headaches, feel tense, or have nausea.  Urinate more than normal.  Have diarrhea. Get help right away if you have:  A racing heart and shortness of breath.  Thoughts of hurting yourself or others. If you ever feel like you may hurt yourself or others, or have thoughts about taking your own life, get help right away. You can go to your nearest emergency department or call:  Your local emergency services (911 in the [place]  A suicide crisis helpline, such as the National Suicide Prevention Lifeline at (240)606-3774. This is open 24 hours a day. Summary  Taking steps to learn and use tension reduction techniques can help calm you and help prevent triggering an anxiety reaction.  When used together, medicines, psychotherapy, and tension reduction techniques may be the most effective treatment.  Family, friends, and partners can play a big part in helping you recover from an anxiety disorder. This information is not intended to replace advice given to you by your health care  provider. Make sure you discuss any questions you have with your health care provider. Document Revised: 06/23/2018 Document Reviewed: 06/23/2018 Elsevier Patient Education  [place].

## 2019-10-28 ENCOUNTER — Other Ambulatory Visit: Payer: Self-pay | Admitting: *Deleted

## 2019-10-28 DIAGNOSIS — M810 Age-related osteoporosis without current pathological fracture: Secondary | ICD-10-CM

## 2019-10-28 DIAGNOSIS — E782 Mixed hyperlipidemia: Secondary | ICD-10-CM

## 2019-10-28 DIAGNOSIS — F411 Generalized anxiety disorder: Secondary | ICD-10-CM

## 2019-10-28 DIAGNOSIS — F5101 Primary insomnia: Secondary | ICD-10-CM

## 2019-10-28 DIAGNOSIS — N3281 Overactive bladder: Secondary | ICD-10-CM

## 2019-10-28 DIAGNOSIS — I10 Essential (primary) hypertension: Secondary | ICD-10-CM

## 2019-11-04 ENCOUNTER — Ambulatory Visit: Payer: Self-pay | Admitting: Pharmacist

## 2019-11-04 NOTE — Chronic Care Management (AMB) (Addendum)
°  Chronic Care Management   Follow Up Note   11/04/2019 Name: Brittany Levy MRN: [id number] DOB: February 13, 1934  Referred by: Donita Brooks, MD Reason for referral : No chief complaint on file.   Brittany Levy is a 85 y.o. year old female who is a primary care patient of Pickard, Priscille Heidelberg, MD. The CCM team was consulted for assistance with chronic disease management and care coordination needs.    Review of patient status, including review of consultants reports, relevant laboratory and other test results, and collaboration with appropriate care team members and the patient's provider was performed as part of comprehensive patient evaluation and provision of chronic care management services.    SDOH (Social Determinants of Health) assessments performed: No See Care Plan activities for detailed interventions related to [facility])      Outpatient Encounter Medications as of 11/04/2019  Medication Sig   aspirin 81 MG tablet Take 81 mg by mouth daily.   atorvastatin (LIPITOR) 20 MG tablet TAKE 1 TABLET BY MOUTH ONCE A DAY   calcium-vitamin D (OSCAL WITH D) 250-125 MG-UNIT per tablet Take 1 tablet by mouth 2 (two) times daily.   carvedilol (COREG) 6.25 MG tablet TAKE 1 TABLET BY MOUTH TWICE A DAY WITH A MEAL   clonazePAM (KLONOPIN) 0.5 MG tablet Take 1 tablet (0.5 mg total) by mouth 2 (two) times daily as needed for anxiety. (Patient not taking: Reported on 10/27/2019)   cloNIDine (CATAPRES) 0.2 MG tablet TAKE 1 TABLET BY MOUTH 3 TIMES DAILY. STOP ATENOLOL AND CLONIDINE PATCH.   felodipine (PLENDIL) 5 MG 24 hr tablet TAKE 1 TABLET BY MOUTH ONCE DAILY   fluticasone (FLONASE) 50 MCG/ACT nasal spray Place 2 sprays into both nostrils daily.   hydrALAZINE (APRESOLINE) 25 MG tablet TAKE 2 TABLETS BY MOUTH TWICE A DAY   hydrochlorothiazide (HYDRODIURIL) 25 MG tablet TAKE 1 TABLET BY MOUTH ONCE A DAY   irbesartan (AVAPRO) 300 MG tablet TAKE 1 TABLET BY MOUTH ONCE A DAY   Omega-3 Fatty Acids (FISH  OIL) 1200 MG CAPS Take 2 capsules by mouth.    omeprazole (PRILOSEC) 40 MG capsule TAKE 1 CAPSULE BY MOUTH ONCE DAILY   potassium chloride (KLOR-CON) 10 MEQ tablet TAKE 1 TABLET BY MOUTH ONCE A DAY   traZODone (DESYREL) 50 MG tablet Take 1 tablet (50 mg total) by mouth at bedtime as needed for sleep. (Patient not taking: Reported on 10/27/2019)   Facility-Administered Encounter Medications as of 11/04/2019  Medication   denosumab (PROLIA) injection 60 mg     Objective:  Analyzed care gaps and adherence data for STAR measures. Goals Addressed   None    Care Gaps:  No AWV for 2021  Adherence Measures Irbesartan 300mg  90-99% adherent for 2021 so far. 0.985 PDC.  One more fill for the year of 90 ds and patient will meet adherence measures.  Plan:   The patient has been provided with contact information for the care management team and has been advised to call with any health related questions or concerns.    2022, PharmD Clinical Pharmacist [name] [phone]  I have collaborated with the care management provider regarding care management and care coordination activities outlined in this encounter and have reviewed this encounter including documentation in the note and care plan. I am certifying that I agree with the content of this note and encounter as supervising physician.

## 2020-01-17 ENCOUNTER — Other Ambulatory Visit: Payer: Self-pay | Admitting: Family Medicine

## 2020-02-18 ENCOUNTER — Telehealth: Payer: Self-pay | Admitting: Pharmacist

## 2020-02-18 NOTE — Progress Notes (Addendum)
Chronic Care Management Pharmacy Assistant   Name: Brittany Levy  MRN: [id number] DOB: 06/03/1934  Reason for Encounter: Disease State for HTN.  Patient Questions:  1.  Have you seen any other providers since your last visit? No.   2.  Any changes in your medicines or health? No.    PCP : Donita Brooks, MD   Office Visits: None since 11/04/19  Consults: None since 11/04/19  Allergies:  No Known Allergies  Medications: Outpatient Encounter Medications as of 02/18/2020  Medication Sig   aspirin 81 MG tablet Take 81 mg by mouth daily.   atorvastatin (LIPITOR) 20 MG tablet TAKE 1 TABLET BY MOUTH ONCE A DAY   calcium-vitamin D (OSCAL WITH D) 250-125 MG-UNIT per tablet Take 1 tablet by mouth 2 (two) times daily.   carvedilol (COREG) 6.25 MG tablet TAKE 1 TABLET BY MOUTH TWICE A DAY WITH A MEAL   clonazePAM (KLONOPIN) 0.5 MG tablet Take 1 tablet (0.5 mg total) by mouth 2 (two) times daily as needed for anxiety. (Patient not taking: Reported on 10/27/2019)   cloNIDine (CATAPRES) 0.2 MG tablet TAKE 1 TABLET BY MOUTH 3 TIMES DAILY. STOP ATENOLOL AND CLONIDINE PATCH.   felodipine (PLENDIL) 5 MG 24 hr tablet TAKE 1 TABLET BY MOUTH ONCE DAILY   fluticasone (FLONASE) 50 MCG/ACT nasal spray Place 2 sprays into both nostrils daily.   hydrALAZINE (APRESOLINE) 25 MG tablet TAKE 2 TABLETS BY MOUTH TWICE A DAY   hydrochlorothiazide (HYDRODIURIL) 25 MG tablet TAKE 1 TABLET BY MOUTH ONCE A DAY   irbesartan (AVAPRO) 300 MG tablet TAKE 1 TABLET BY MOUTH ONCE A DAY   Omega-3 Fatty Acids (FISH OIL) 1200 MG CAPS Take 2 capsules by mouth.    omeprazole (PRILOSEC) 40 MG capsule TAKE 1 CAPSULE BY MOUTH ONCE DAILY   potassium chloride (KLOR-CON) 10 MEQ tablet TAKE 1 TABLET BY MOUTH ONCE A DAY   traZODone (DESYREL) 50 MG tablet Take 1 tablet (50 mg total) by mouth at bedtime as needed for sleep. (Patient not taking: Reported on 10/27/2019)   Facility-Administered Encounter Medications as of 02/18/2020   Medication   denosumab (PROLIA) injection 60 mg    Current Diagnosis: Patient Active Problem List   Diagnosis Date Noted   GAD (generalized anxiety disorder) 09/08/2018   Osteoporosis 06/25/2016   Insomnia    Dyspnea 05/12/2012   Hypertension    GERD (gastroesophageal reflux disease)     Goals Addressed   None    Reviewed chart prior to disease state call. Spoke with patient regarding BP  Recent Office Vitals: BP Readings from Last 3 Encounters:  09/30/19 (!) 160/80  08/17/19 (!) 150/68  07/30/19 140/80   Pulse Readings from Last 3 Encounters:  09/30/19 76  08/17/19 90  07/30/19 78    Wt Readings from Last 3 Encounters:  09/30/19 114 lb (51.7 kg)  08/17/19 114 lb (51.7 kg)  10/08/18 120 lb (54.4 kg)     Kidney Function Lab Results  Component Value Date/Time   CREATININE 0.89 (H) 07/30/2019 09:11 AM   CREATININE 0.73 08/18/2018 08:57 AM   GFRNONAA 60 07/30/2019 09:11 AM   GFRAA 69 07/30/2019 09:11 AM    BMP Latest Ref Rng & Units 07/30/2019 08/18/2018 04/23/2017  Glucose 65 - 99 mg/dL 841(L) 97 244(W)  BUN 7 - 25 mg/dL 20 14 20   Creatinine 0.60 - 0.88 mg/dL ) 1.02(V 2.53)  BUN/Creat Ratio 6 - 22 (calc) 22 NOT APPLICABLE 20  Sodium 135 -  146 mmol/L 140 136 138  Potassium 3.5 - 5.3 mmol/L 4.1 4.3 4.3  Chloride 98 - 110 mmol/L 99 97(L) 99  CO2 20 - 32 mmol/L 30 35(H) 34(H)  Calcium 8.6 - 10.4 mg/dL 34.1 9.9 9.9    Current antihypertensive regimen:  Atorvastatin 20 mg   How often are you checking your Blood Pressure? Patient stated she checks her blood pressure almost daily but has not been writing them down.  Current home BP readings: N/A  What recent interventions/DTPs have been made by any provider to improve Blood Pressure control since last CPP Visit: None  Any recent hospitalizations or ED visits since last visit with CPP? No.   What diet changes have been made to improve Blood Pressure Control?  Patient stated she eats whatever she has a  taste for at this time.  What exercise is being done to improve your Blood Pressure Control?  Patient stated her actively is pretty busy, she works outside and inside house.   Adherence Review: Is the patient currently on ACE/ARB medication? No.  Does the patient have >5 day gap between last estimated fill dates? No, CPP Please Check.    Follow-Up:  Pharmacist Review   Hulen Luster, RMA Clinical Pharmacist Assistant [phone]  5 minutes spent in review, coordination, and documentation.  Reviewed by: [name], PharmD Clinical Pharmacist [facility] [phone]

## 2020-02-28 ENCOUNTER — Other Ambulatory Visit: Payer: Self-pay | Admitting: Family Medicine

## 2020-02-29 ENCOUNTER — Telehealth: Payer: Self-pay | Admitting: Family Medicine

## 2020-02-29 MED ORDER — POTASSIUM CHLORIDE ER 10 MEQ PO TBCR: 10.0000 meq | EXTENDED_RELEASE_TABLET | Freq: Every day | ORAL | 3 refills | Status: DC

## 2020-02-29 NOTE — Telephone Encounter (Signed)
Prescription sent to pharmacy.

## 2020-02-29 NOTE — Telephone Encounter (Signed)
Received fax from [facility] requesting a refill on potassium chloride (KLOR-CON) 10 MEQ tablet

## 2020-03-17 ENCOUNTER — Other Ambulatory Visit: Payer: Self-pay | Admitting: Family Medicine

## 2020-03-22 ENCOUNTER — Encounter: Payer: Self-pay | Admitting: *Deleted

## 2020-03-30 ENCOUNTER — Other Ambulatory Visit: Payer: Self-pay

## 2020-03-30 ENCOUNTER — Ambulatory Visit: Payer: PPO

## 2020-03-31 ENCOUNTER — Ambulatory Visit (INDEPENDENT_AMBULATORY_CARE_PROVIDER_SITE_OTHER): Payer: PPO | Admitting: *Deleted

## 2020-03-31 DIAGNOSIS — M810 Age-related osteoporosis without current pathological fracture: Secondary | ICD-10-CM | POA: Diagnosis not present

## 2020-04-03 ENCOUNTER — Ambulatory Visit: Payer: PPO | Admitting: Podiatry

## 2020-04-03 ENCOUNTER — Encounter: Payer: Self-pay | Admitting: Podiatry

## 2020-04-03 ENCOUNTER — Other Ambulatory Visit: Payer: Self-pay

## 2020-04-03 DIAGNOSIS — L600 Ingrowing nail: Secondary | ICD-10-CM | POA: Diagnosis not present

## 2020-04-03 NOTE — Progress Notes (Signed)
Subjective:  Patient ID: Brittany Levy, female    DOB: 1934-10-17,  MRN: [id number] HPI Chief Complaint  Patient presents with  . Nail Problem    Patient presents today for ingrown toenail/nail fungus left great toenail.  She says "it was really sore last week on the side of my nail and I have been soaking it in Epson salt water, which has helped.  Its not as sore as it was"    86 y.o. female presents with the above complaint.   ROS: Denies fever chills nausea vomiting muscle aches pains calf pain back pain chest pain shortness of breath.  Past Medical History:  Diagnosis Date  . Chest pain   . GERD (gastroesophageal reflux disease)   . Hypertension   . Insomnia   . Osteoporosis of femur without pathological fracture    Past Surgical History:  Procedure Laterality Date  . ABDOMINAL HYSTERECTOMY    . BREAST BIOPSY Right 05/07/2016   FRAGMENTS OF LYMPH NODE    Current Outpatient Medications:  .  aspirin 81 MG tablet, Take 81 mg by mouth daily., Disp: , Rfl:  .  atorvastatin (LIPITOR) 20 MG tablet, TAKE 1 TABLET BY MOUTH ONCE A DAY, Disp: 90 tablet, Rfl: 0 .  calcium-vitamin D (OSCAL WITH D) 250-125 MG-UNIT per tablet, Take 1 tablet by mouth 2 (two) times daily., Disp: , Rfl:  .  carvedilol (COREG) 6.25 MG tablet, TAKE 1 TABLET BY MOUTH TWICE A DAY WITH A MEAL, Disp: 180 tablet, Rfl: 2 .  clonazePAM (KLONOPIN) 0.5 MG tablet, Take 1 tablet (0.5 mg total) by mouth 2 (two) times daily as needed for anxiety. (Patient not taking: Reported on 10/27/2019), Disp: 30 tablet, Rfl: 1 .  cloNIDine (CATAPRES) 0.2 MG tablet, TAKE 1 TABLET BY MOUTH 3 TIMES DAILY. STOP ATENOLOL AND CLONIDINE PATCH., Disp: 90 tablet, Rfl: 2 .  felodipine (PLENDIL) 5 MG 24 hr tablet, TAKE 1 TABLET BY MOUTH ONCE DAILY, Disp: 90 tablet, Rfl: 1 .  hydrALAZINE (APRESOLINE) 25 MG tablet, TAKE 2 TABLETS BY MOUTH TWICE A DAY, Disp: 360 tablet, Rfl: 1 .  hydrochlorothiazide (HYDRODIURIL) 25 MG tablet, TAKE 1 TABLET BY  MOUTH ONCE A DAY, Disp: 180 tablet, Rfl: 3 .  irbesartan (AVAPRO) 300 MG tablet, TAKE 1 TABLET BY MOUTH ONCE A DAY, Disp: 90 tablet, Rfl: 1 .  Omega-3 Fatty Acids (FISH OIL) 1200 MG CAPS, Take 2 capsules by mouth. , Disp: , Rfl:  .  omeprazole (PRILOSEC) 40 MG capsule, TAKE 1 CAPSULE BY MOUTH ONCE DAILY, Disp: 90 capsule, Rfl: 3 .  potassium chloride (KLOR-CON) 10 MEQ tablet, Take 1 tablet (10 mEq total) by mouth daily., Disp: 90 tablet, Rfl: 3  Current Facility-Administered Medications:  .  denosumab (PROLIA) injection 60 mg, 60 mg, Subcutaneous, Q6 months, Donita Brooks, MD, 60 mg at 03/31/20 1154  No Known Allergies Review of Systems Objective:  There were no vitals filed for this visit.  General: Well developed, nourished, in no acute distress, alert and oriented x3   Dermatological: Skin is warm, dry and supple bilateral. Nails x 10 are well maintained; remaining integument appears unremarkable at this time. There are no open sores, no preulcerative lesions, no rash or signs of infection present.  Hallux left demonstrates a sharp innervated nail margin with a history of trauma to the nail as the old nail is growing out and the new nail is growing in there appears to be some lifting of the distal aspect of the nail.  I was able to debride this away from the margins today relieving her pain.   Vascular: Dorsalis Pedis artery and Posterior Tibial artery pedal pulses are 2/4 bilateral with immedate capillary fill time. Pedal hair growth present. No varicosities and no lower extremity edema present bilateral.   Neruologic: Grossly intact via light touch bilateral. Vibratory intact via tuning fork bilateral. Protective threshold with Semmes Wienstein monofilament intact to all pedal sites bilateral. Patellar and Achilles deep tendon reflexes 2+ bilateral. No Babinski or clonus noted bilateral.   Musculoskeletal: No gross boney pedal deformities bilateral. No pain, crepitus, or limitation  noted with foot and ankle range of motion bilateral. Muscular strength 5/5 in all groups tested bilateral.  Gait: Unassisted, Nonantalgic.    Radiographs:  None taken  Assessment & Plan:   Assessment: Ingrown toenail hallux left nail dystrophy.    Plan: I went ahead and debrided her margins for her today she will notify me with any questions or concerns.     Morrell Fluke T. [place], [place]

## 2020-04-13 ENCOUNTER — Encounter: Payer: Self-pay | Admitting: Family Medicine

## 2020-04-13 ENCOUNTER — Other Ambulatory Visit: Payer: Self-pay

## 2020-04-13 ENCOUNTER — Ambulatory Visit (INDEPENDENT_AMBULATORY_CARE_PROVIDER_SITE_OTHER): Payer: PPO | Admitting: Family Medicine

## 2020-04-13 VITALS — BP 140/64 | HR 70 | Temp 97.9°F | Resp 16 | Ht 63.0 in | Wt 111.0 lb

## 2020-04-13 DIAGNOSIS — M810 Age-related osteoporosis without current pathological fracture: Secondary | ICD-10-CM

## 2020-04-13 DIAGNOSIS — E782 Mixed hyperlipidemia: Secondary | ICD-10-CM | POA: Diagnosis not present

## 2020-04-13 DIAGNOSIS — I10 Essential (primary) hypertension: Secondary | ICD-10-CM

## 2020-04-13 MED ORDER — CLONAZEPAM 0.5 MG PO TABS: 0.5000 mg | ORAL_TABLET | Freq: Two times a day (BID) | ORAL | 1 refills | Status: DC | PRN

## 2020-04-13 NOTE — Progress Notes (Signed)
Subjective:    Patient ID: Brittany Levy, female    DOB: 14-Sep-1934, 86 y.o.   MRN: [id number]  HPI Patient is a very sweet 86 year old Caucasian female who is here today for follow-up of her hypertension.  She has a history of very hard to control blood pressure.  Since I last saw the patient, unfortunately her husband passed away from lung cancer.  She states that she falls asleep relatively easily.  She usually goes to bed around 10 or 11:00.  However she consistently wakes up around 3 in the morning.  She is unable to fall back asleep after that.  I believe some of this is likely due to grieving over the loss of her husband and intrusive thoughts that occur at night when she is not distracted.  She is overdue to recheck her osteoporosis with a bone density as her last bone density test was 4 years ago.  She also complains about dryness in her throat and in her airways and in her mouth.  I believe that that is most likely due to the clonidine.  About a year ago I took her off clonidine at night because I thought it was dropping her blood pressure too much.  She admits that she is not been checking her blood pressure at home but I am very concerned that we may be overmedicating her.  I believe a lot of her blood pressure is due to anxiety and whitecoat syndrome.  Therefore I have asked her to start checking her blood pressure at home because of his low at home, I would like to try to wean her away from the clonidine and I think that this would help with some of the dryness that she complains about. Past Medical History:  Diagnosis Date  . Chest pain   . GERD (gastroesophageal reflux disease)   . Hypertension   . Insomnia   . Osteoporosis of femur without pathological fracture    Past Surgical History:  Procedure Laterality Date  . ABDOMINAL HYSTERECTOMY    . BREAST BIOPSY Right 05/07/2016   FRAGMENTS OF LYMPH NODE   Current Outpatient Medications on File Prior to Visit  Medication Sig  Dispense Refill  . aspirin 81 MG tablet Take 81 mg by mouth daily.    Marland Kitchen atorvastatin (LIPITOR) 20 MG tablet TAKE 1 TABLET BY MOUTH ONCE A DAY 90 tablet 0  . calcium-vitamin D (OSCAL WITH D) 250-125 MG-UNIT per tablet Take 1 tablet by mouth 2 (two) times daily.    . carvedilol (COREG) 6.25 MG tablet TAKE 1 TABLET BY MOUTH TWICE A DAY WITH A MEAL 180 tablet 2  . clonazePAM (KLONOPIN) 0.5 MG tablet Take 1 tablet (0.5 mg total) by mouth 2 (two) times daily as needed for anxiety. (Patient not taking: Reported on 10/27/2019) 30 tablet 1  . cloNIDine (CATAPRES) 0.2 MG tablet TAKE 1 TABLET BY MOUTH 3 TIMES DAILY. STOP ATENOLOL AND CLONIDINE PATCH. 90 tablet 2  . felodipine (PLENDIL) 5 MG 24 hr tablet TAKE 1 TABLET BY MOUTH ONCE DAILY 90 tablet 1  . hydrALAZINE (APRESOLINE) 25 MG tablet TAKE 2 TABLETS BY MOUTH TWICE A DAY 360 tablet 1  . hydrochlorothiazide (HYDRODIURIL) 25 MG tablet TAKE 1 TABLET BY MOUTH ONCE A DAY 180 tablet 3  . irbesartan (AVAPRO) 300 MG tablet TAKE 1 TABLET BY MOUTH ONCE A DAY 90 tablet 1  . Omega-3 Fatty Acids (FISH OIL) 1200 MG CAPS Take 2 capsules by mouth.     Marland Kitchen  omeprazole (PRILOSEC) 40 MG capsule TAKE 1 CAPSULE BY MOUTH ONCE DAILY 90 capsule 3  . potassium chloride (KLOR-CON) 10 MEQ tablet Take 1 tablet (10 mEq total) by mouth daily. 90 tablet 3   Current Facility-Administered Medications on File Prior to Visit  Medication Dose Route Frequency Provider Last Rate Last Admin  . denosumab (PROLIA) injection 60 mg  60 mg Subcutaneous Q6 months Donita Brooks, MD   60 mg at 03/31/20 1154   No Known Allergies  Social History   Socioeconomic History  . Marital status: Married    Spouse name: Not on file  . Number of children: Not on file  . Years of education: Not on file  . Highest education level: Not on file  Occupational History  . Not on file  Tobacco Use  . Smoking status: Never Smoker  . Smokeless tobacco: Never Used  Substance and Sexual Activity  . Alcohol  use: Yes    Comment: wine occasionally  . Drug use: No  . Sexual activity: Not on file  Other Topics Concern  . Not on file  Social History Narrative  . Not on file   Social Determinants of Health   Financial Resource Strain: Low Risk   . Difficulty of Paying Living Expenses: Not very hard  Food Insecurity: Not on file  Transportation Needs: Not on file  Physical Activity: Not on file  Stress: Not on file  Social Connections: Not on file  Intimate Partner Violence: Not on file     Review of Systems  All other systems reviewed and are negative.      Objective:   Physical Exam Vitals reviewed.  Constitutional:      General: She is not in acute distress.    Appearance: Normal appearance. She is normal weight. She is not ill-appearing or toxic-appearing.  Cardiovascular:     Rate and Rhythm: Normal rate and regular rhythm.     Pulses: Normal pulses.     Heart sounds: Normal heart sounds. No murmur heard. No friction rub. No gallop.   Pulmonary:     Effort: Pulmonary effort is normal. No respiratory distress.     Breath sounds: Normal breath sounds. No stridor. No wheezing, rhonchi or rales.  Chest:     Chest wall: No tenderness.  Musculoskeletal:     Right lower leg: No edema.     Left lower leg: No edema.  Neurological:     Mental Status: She is alert.           Assessment & Plan:  Essential hypertension  Mixed hyperlipidemia  Osteoporosis, unspecified osteoporosis type, unspecified pathological fracture presence - Plan: DG Bone Density  Blood pressure here today is outstanding.  Of asked the patient to start checking her blood pressure at home and if consistently below 140 systolic, I would like to try to wean her away from clonidine gradually as I believe this is a contributor to some fatigue as well as dry mouth and the dryness that she complains about.  Check CBC, CMP, lipid panel.  Blood pressure today is good for this patient.  Try Klonopin 0.5 mg p.o.  every 12 hours as needed as needed for anxiety or insomnia.  I encouraged her to try taking it only at night when she wakes up if she cannot fall back to sleep.

## 2020-04-14 LAB — LIPID PANEL
Cholesterol: 139 mg/dL (ref <200)
HDL: 81 mg/dL (ref >=50)
LDL Cholesterol (Calc): 44 mg/dL (calc)
Non-HDL Cholesterol (Calc): 58 mg/dL (calc) (ref <130)
Total CHOL/HDL Ratio: 1.7 (calc) (ref <5.0)
Triglycerides: 63 mg/dL (ref <150)

## 2020-04-14 LAB — COMPLETE METABOLIC PANEL WITH GFR
AG Ratio: 1.7 (calc) (ref 1.0–2.5)
ALT: 15 U/L (ref 6–29)
AST: 14 U/L (ref 10–35)
Albumin: 4.1 g/dL (ref 3.6–5.1)
Alkaline phosphatase (APISO): 37 U/L (ref 37–153)
BUN: 21 mg/dL (ref 7–25)
CO2: 31 mmol/L (ref 20–32)
Calcium: 10 mg/dL (ref 8.6–10.4)
Chloride: 101 mmol/L (ref 98–110)
Creat: 0.77 mg/dL (ref 0.60–0.88)
GFR, Est African American: 83 mL/min/{1.73_m2} (ref >=60)
GFR, Est Non African American: 71 mL/min/{1.73_m2} (ref >=60)
Globulin: 2.4 g/dL (calc) (ref 1.9–3.7)
Glucose, Bld: 104 mg/dL — ABNORMAL HIGH (ref 65–99)
Potassium: 4.3 mmol/L (ref 3.5–5.3)
Sodium: 139 mmol/L (ref 135–146)
Total Bilirubin: 0.6 mg/dL (ref 0.2–1.2)
Total Protein: 6.5 g/dL (ref 6.1–8.1)

## 2020-04-14 LAB — CBC WITH DIFFERENTIAL/PLATELET
Absolute Monocytes: 678 cells/uL (ref 200–950)
Basophils Absolute: 40 cells/uL (ref 0–200)
Basophils Relative: 0.7 %
Eosinophils Absolute: 108 cells/uL (ref 15–500)
Eosinophils Relative: 1.9 %
HCT: 36.5 % (ref 35.0–45.0)
Hemoglobin: 12 g/dL (ref 11.7–15.5)
Lymphs Abs: 2240 cells/uL (ref 850–3900)
MCH: 29.8 pg (ref 27.0–33.0)
MCHC: 32.9 g/dL (ref 32.0–36.0)
MCV: 90.6 fL (ref 80.0–100.0)
MPV: 11.7 fL (ref 7.5–12.5)
Monocytes Relative: 11.9 %
Neutro Abs: 2633 cells/uL (ref 1500–7800)
Neutrophils Relative %: 46.2 %
Platelets: 157 10*3/uL (ref 140–400)
RBC: 4.03 10*6/uL (ref 3.80–5.10)
RDW: 12.6 % (ref 11.0–15.0)
Total Lymphocyte: 39.3 %
WBC: 5.7 10*3/uL (ref 3.8–10.8)

## 2020-04-17 ENCOUNTER — Encounter: Payer: Self-pay | Admitting: *Deleted

## 2020-05-26 ENCOUNTER — Other Ambulatory Visit: Payer: PPO

## 2020-06-19 ENCOUNTER — Telehealth: Payer: Self-pay | Admitting: Pharmacist

## 2020-06-19 NOTE — Progress Notes (Addendum)
Chronic Care Management Pharmacy Assistant   Name: Brittany Levy  MRN: [id number] DOB: February 24, 1934  Reason for Encounter: Disease State For HTN.    Conditions to be addressed/monitored: HTN, GERD, Insomnia, Anxiety.   Recent office visits:  04/13/20 Dr. Tanya Nones. For follow-up. No medication changes.   Recent consult visits:  04/03/20 Podiatry Orion, Max T, DPM. For ingrown left big toenail. STOPPED Fluticasone Propionate, Trazodone.   Hospital visits:  None in previous 6 months  Medications: Outpatient Encounter Medications as of 06/19/2020  Medication Sig   aspirin 81 MG tablet Take 81 mg by mouth daily.   atorvastatin (LIPITOR) 20 MG tablet TAKE 1 TABLET BY MOUTH ONCE A DAY   calcium-vitamin D (OSCAL WITH D) 250-125 MG-UNIT per tablet Take 1 tablet by mouth 2 (two) times daily.   carvedilol (COREG) 6.25 MG tablet TAKE 1 TABLET BY MOUTH TWICE A DAY WITH A MEAL   clonazePAM (KLONOPIN) 0.5 MG tablet Take 1 tablet (0.5 mg total) by mouth 2 (two) times daily as needed for anxiety.   cloNIDine (CATAPRES) 0.2 MG tablet TAKE 1 TABLET BY MOUTH 3 TIMES DAILY. STOP ATENOLOL AND CLONIDINE PATCH.   felodipine (PLENDIL) 5 MG 24 hr tablet TAKE 1 TABLET BY MOUTH ONCE DAILY   hydrALAZINE (APRESOLINE) 25 MG tablet TAKE 2 TABLETS BY MOUTH TWICE A DAY   hydrochlorothiazide (HYDRODIURIL) 25 MG tablet TAKE 1 TABLET BY MOUTH ONCE A DAY   irbesartan (AVAPRO) 300 MG tablet TAKE 1 TABLET BY MOUTH ONCE A DAY   Omega-3 Fatty Acids (FISH OIL) 1200 MG CAPS Take 2 capsules by mouth.    omeprazole (PRILOSEC) 40 MG capsule TAKE 1 CAPSULE BY MOUTH ONCE DAILY   potassium chloride (KLOR-CON) 10 MEQ tablet Take 1 tablet (10 mEq total) by mouth daily.   Facility-Administered Encounter Medications as of 06/19/2020  Medication   denosumab (PROLIA) injection 60 mg    Reviewed chart prior to disease state call. Spoke with patient regarding BP  Recent Office Vitals: BP Readings from Last 3 Encounters:   04/13/20 140/64  09/30/19 (!) 160/80  08/17/19 (!) 150/68   Pulse Readings from Last 3 Encounters:  04/13/20 70  09/30/19 76  08/17/19 90    Wt Readings from Last 3 Encounters:  04/13/20 111 lb (50.3 kg)  09/30/19 114 lb (51.7 kg)  08/17/19 114 lb (51.7 kg)     Kidney Function Lab Results  Component Value Date/Time   CREATININE 0.77 04/13/2020 10:35 AM   CREATININE 0.89 (H) 07/30/2019 09:11 AM   GFRNONAA 71 04/13/2020 10:35 AM   GFRAA 83 04/13/2020 10:35 AM    BMP Latest Ref Rng & Units 04/13/2020 07/30/2019 08/18/2018  Glucose 65 - 99 mg/dL 333(L) 456(Y) 97  BUN 7 - 25 mg/dL 21 20 14   Creatinine 0.60 - 0.88 mg/dL 5.63) 8.93(T  BUN/Creat Ratio 6 - 22 (calc) NOT APPLICABLE 22 NOT APPLICABLE  Sodium 135 - 146 mmol/L 139 140 136  Potassium 3.5 - 5.3 mmol/L 4.3 4.1 4.3  Chloride 98 - 110 mmol/L 101 99 97(L)  CO2 20 - 32 mmol/L 31 30 35(H)  Calcium 8.6 - 10.4 mg/dL 3.42 87.6 9.9    Current antihypertensive regimen:  Carvedilol 6.25 mg 1 tablet twice daily  Felodipine 5 mg 1 tablet daily  Hydralazine 25 mg 2 tablets twice daily   What recent interventions/DTPs have been made by any provider to improve Blood Pressure control since last CPP Visit: None.  Any recent hospitalizations or ED  visits since last visit with CPP? Per her chart no.  Adherence Review: Is the patient currently on ACE/ARB medication? Atorvastatin 20 mg  Does the patient have >5 day gap between last estimated fill dates? Per misc rpts, no.  Star Rating Drugs: Atorvastatin 20 mg 90 DS 06/05/20  Third unsuccessful telephone outreach was attempted today. The patient was referred to the pharmacist for assistance with care management and care coordination.   Follow-Up:Pharmacist Review  Hulen Luster, RMA Clinical Pharmacist Assistant [phone]  5 minutes spent in review, coordination, and documentation.  Reviewed by: [name], PharmD Clinical Pharmacist [facility] [phone]

## 2020-06-20 ENCOUNTER — Telehealth: Payer: Self-pay | Admitting: Family Medicine

## 2020-06-20 NOTE — Telephone Encounter (Signed)
Copied from CRM [id number]. Topic: Medicare AWV >> Jun 20, 2020  1:45 PM Claudette Laws R wrote: Reason for CRM:  Left message for patient to call back and schedule Medicare Annual Wellness Visit (AWV) in office.   If not able to come in office, please offer to do virtually or by telephone.   Last AWV: 08/18/2018  Please schedule at anytime with [facility]-Nurse Health Advisor.  If any questions, please contact me at [phone]

## 2020-06-27 ENCOUNTER — Other Ambulatory Visit: Payer: PPO

## 2020-07-06 ENCOUNTER — Other Ambulatory Visit: Payer: Self-pay

## 2020-07-06 ENCOUNTER — Ambulatory Visit
Admission: RE | Admit: 2020-07-06 | Discharge: 2020-07-06 | Disposition: A | Discharge status: Home / Self Care | Payer: PPO | Source: Ambulatory Visit | Attending: Family Medicine | Admitting: Family Medicine

## 2020-07-06 DIAGNOSIS — M810 Age-related osteoporosis without current pathological fracture: Secondary | ICD-10-CM

## 2020-07-06 DIAGNOSIS — Z780 Asymptomatic menopausal state: Secondary | ICD-10-CM | POA: Diagnosis not present

## 2020-07-06 DIAGNOSIS — M8589 Other specified disorders of bone density and structure, multiple sites: Secondary | ICD-10-CM | POA: Diagnosis not present

## 2020-07-11 ENCOUNTER — Encounter: Payer: Self-pay | Admitting: *Deleted

## 2020-07-12 ENCOUNTER — Telehealth: Payer: Self-pay

## 2020-07-12 NOTE — Progress Notes (Addendum)
Chronic Care Management Pharmacy Assistant   Name: Brittany Levy  MRN: [id number] DOB: 03/21/1934  Reason for Encounter: Disease State For HTN   Conditions to be addressed/monitored: HTN, GERD, Insomnia, Anxiety.   Recent office visits:  None since 06/19/20  Recent consult visits:  None since 06/19/20  Hospital visits:  None since 06/19/20  Medications: Outpatient Encounter Medications as of 07/12/2020  Medication Sig   aspirin 81 MG tablet Take 81 mg by mouth daily.   atorvastatin (LIPITOR) 20 MG tablet TAKE 1 TABLET BY MOUTH ONCE A DAY   calcium-vitamin D (OSCAL WITH D) 250-125 MG-UNIT per tablet Take 1 tablet by mouth 2 (two) times daily.   carvedilol (COREG) 6.25 MG tablet TAKE 1 TABLET BY MOUTH TWICE A DAY WITH A MEAL   clonazePAM (KLONOPIN) 0.5 MG tablet Take 1 tablet (0.5 mg total) by mouth 2 (two) times daily as needed for anxiety.   cloNIDine (CATAPRES) 0.2 MG tablet TAKE 1 TABLET BY MOUTH 3 TIMES DAILY. STOP ATENOLOL AND CLONIDINE PATCH.   felodipine (PLENDIL) 5 MG 24 hr tablet TAKE 1 TABLET BY MOUTH ONCE DAILY   hydrALAZINE (APRESOLINE) 25 MG tablet TAKE 2 TABLETS BY MOUTH TWICE A DAY   hydrochlorothiazide (HYDRODIURIL) 25 MG tablet TAKE 1 TABLET BY MOUTH ONCE A DAY   irbesartan (AVAPRO) 300 MG tablet TAKE 1 TABLET BY MOUTH ONCE A DAY   Omega-3 Fatty Acids (FISH OIL) 1200 MG CAPS Take 2 capsules by mouth.    omeprazole (PRILOSEC) 40 MG capsule TAKE 1 CAPSULE BY MOUTH ONCE DAILY   potassium chloride (KLOR-CON) 10 MEQ tablet Take 1 tablet (10 mEq total) by mouth daily.   Facility-Administered Encounter Medications as of 07/12/2020  Medication   denosumab (PROLIA) injection 60 mg    Reviewed chart prior to disease state call. Spoke with patient regarding BP  Recent Office Vitals: BP Readings from Last 3 Encounters:  04/13/20 140/64  09/30/19 (!) 160/80  08/17/19 (!) 150/68   Pulse Readings from Last 3 Encounters:  04/13/20 70  09/30/19 76  08/17/19 90     Wt Readings from Last 3 Encounters:  04/13/20 111 lb (50.3 kg)  09/30/19 114 lb (51.7 kg)  08/17/19 114 lb (51.7 kg)     Kidney Function Lab Results  Component Value Date/Time   CREATININE 0.77 04/13/2020 10:35 AM   CREATININE 0.89 (H) 07/30/2019 09:11 AM   GFRNONAA 71 04/13/2020 10:35 AM   GFRAA 83 04/13/2020 10:35 AM    BMP Latest Ref Rng & Units 04/13/2020 07/30/2019 08/18/2018  Glucose 65 - 99 mg/dL 176(H) 607(P) 97  BUN 7 - 25 mg/dL 21 20 14   Creatinine 0.60 - 0.88 mg/dL 7.10) 6.26(R  BUN/Creat Ratio 6 - 22 (calc) NOT APPLICABLE 22 NOT APPLICABLE  Sodium 135 - 146 mmol/L 139 140 136  Potassium 3.5 - 5.3 mmol/L 4.3 4.1 4.3  Chloride 98 - 110 mmol/L 101 99 97(L)  CO2 20 - 32 mmol/L 31 30 35(H)  Calcium 8.6 - 10.4 mg/dL 4.85 46.2 9.9    Current antihypertensive regimen:  Carvedilol 6.25 mg 1 tablet twice daily  Felodipine 5 mg 1 tablet daily  Hydralazine 25 mg 2 tablets twice daily   How often are you checking your Blood Pressure? Patient stated infrequently   Current home BP readings: Patient stated she don't check her blood pressure regularly but she stated she would start doing it once a week.  What recent interventions/DTPs have been made by any provider  to improve Blood Pressure control since last CPP Visit: None.  Any recent hospitalizations or ED visits since last visit with CPP? Patients chart shows no recent ER visit.  What diet changes have been made to improve Blood Pressure Control?  Patient stated she usually eats three times a day and drinks about 1 bottle of water daily. Patient stated she is trying to eat more vegetables.   What exercise is being done to improve your Blood Pressure Control?  Patient stated she doesn't do any regular exercises but she is very active.   Adherence Review: Is the patient currently on ACE/ARB medication? Atorvastatin 20 mg  Does the patient have >5 day gap between last estimated fill dates?  Per misc rpts, no.  Star  Rating Drugs:Atorvastatin 20 mg 90 DS 06/05/20  Follow-Up:Pharmacist Review   Hulen Luster, RMA Clinical Pharmacist Assistant [phone]  10 minutes spent in review, coordination, and documentation.  Reviewed by: [name], PharmD Clinical Pharmacist [facility] [phone]

## 2020-07-17 ENCOUNTER — Other Ambulatory Visit: Payer: Self-pay | Admitting: Family Medicine

## 2020-07-24 ENCOUNTER — Other Ambulatory Visit: Payer: Self-pay | Admitting: Family Medicine

## 2020-07-31 ENCOUNTER — Other Ambulatory Visit: Payer: Self-pay | Admitting: Family Medicine

## 2020-08-21 ENCOUNTER — Encounter: Payer: Self-pay | Admitting: Family Medicine

## 2020-08-21 ENCOUNTER — Other Ambulatory Visit: Payer: Self-pay

## 2020-08-21 ENCOUNTER — Ambulatory Visit (INDEPENDENT_AMBULATORY_CARE_PROVIDER_SITE_OTHER): Payer: PPO | Admitting: Family Medicine

## 2020-08-21 VITALS — BP 130/72 | HR 88 | Temp 98.8°F | Resp 14 | Ht 63.0 in | Wt 112.0 lb

## 2020-08-21 DIAGNOSIS — G629 Polyneuropathy, unspecified: Secondary | ICD-10-CM | POA: Diagnosis not present

## 2020-08-21 MED ORDER — CLONAZEPAM 0.5 MG PO TABS: ORAL_TABLET | ORAL | 2 refills | Status: DC

## 2020-08-21 NOTE — Progress Notes (Signed)
Subjective:    Patient ID: Brittany Levy, female    DOB: [birth year], 86 y.o.   MRN: [id number]  HPI Patient reports several months of burning stinging pain in both feet.  This occurs a day as well as at night.  However it is the worst at night.  At times it keeps her from sleeping.  She is using the Klonopin primarily at night to help her sleep.  Since the death of her husband this is the only way she can find that she can go to sleep and get a good nights rest.  She also has some numbness and tingling in her feet.  She has excellent pulses at the dorsalis pedis and posterior tibialis pulses bilaterally she does have diminished sensation to 10 g monofilament and vibration in both feet.  She has normal strength and normal reflexes in both legs.  She denies any low back pain.  She denies any sciatica.  She denies any fevers or chills. Past Medical History:  Diagnosis Date   Chest pain    GERD (gastroesophageal reflux disease)    Hypertension    Insomnia    Osteoporosis of femur without pathological fracture    Past Surgical History:  Procedure Laterality Date   ABDOMINAL HYSTERECTOMY     BREAST BIOPSY Right 05/07/2016   FRAGMENTS OF LYMPH NODE   Current Outpatient Medications on File Prior to Visit  Medication Sig Dispense Refill   aspirin 81 MG tablet Take 81 mg by mouth daily.     atorvastatin (LIPITOR) 20 MG tablet TAKE 1 TABLET BY MOUTH ONCE A DAY 90 tablet 0   calcium-vitamin D (OSCAL WITH D) 250-125 MG-UNIT per tablet Take 1 tablet by mouth 2 (two) times daily.     carvedilol (COREG) 6.25 MG tablet TAKE 1 TABLET BY MOUTH TWICE A DAY WITH A MEAL 180 tablet 2   clonazePAM (KLONOPIN) 0.5 MG tablet TAKE 1 TABLET BY MOUTH TWICE A DAY AS NEEDED FOR ANXIETY 30 tablet 0   cloNIDine (CATAPRES) 0.2 MG tablet TAKE 1 TABLET BY MOUTH 3 TIMES DAILY. STOP ATENOLOL AND CLONIDINE PATCH. 90 tablet 2   felodipine (PLENDIL) 5 MG 24 hr tablet TAKE 1 TABLET BY MOUTH ONCE DAILY 90 tablet 1    hydrALAZINE (APRESOLINE) 25 MG tablet TAKE TWO TABLETS BY MOUTH TWICE A DAY 360 tablet 1   hydrochlorothiazide (HYDRODIURIL) 25 MG tablet TAKE 1 TABLET BY MOUTH ONCE A DAY 180 tablet 3   irbesartan (AVAPRO) 300 MG tablet TAKE 1 TABLET BY MOUTH ONCE A DAY 90 tablet 1   Omega-3 Fatty Acids (FISH OIL) 1200 MG CAPS Take 2 capsules by mouth.      omeprazole (PRILOSEC) 40 MG capsule TAKE 1 CAPSULE BY MOUTH ONCE DAILY 90 capsule 3   potassium chloride (KLOR-CON) 10 MEQ tablet Take 1 tablet (10 mEq total) by mouth daily. 90 tablet 3   Current Facility-Administered Medications on File Prior to Visit  Medication Dose Route Frequency Provider Last Rate Last Admin   denosumab (PROLIA) injection 60 mg  60 mg Subcutaneous Q6 months Donita Brooks, MD   60 mg at 03/31/20 1154   No Known Allergies  Social History   Socioeconomic History   Marital status: Married    Spouse name: Not on file   Number of children: Not on file   Years of education: Not on file   Highest education level: Not on file  Occupational History   Not on file  Tobacco Use  Smoking status: Never   Smokeless tobacco: Never  Substance and Sexual Activity   Alcohol use: Yes    Comment: wine occasionally   Drug use: No   Sexual activity: Not on file  Other Topics Concern   Not on file  Social History Narrative   Not on file   Social Determinants of Health   Financial Resource Strain: Low Risk    Difficulty of Paying Living Expenses: Not very hard  Food Insecurity: Not on file  Transportation Needs: Not on file  Physical Activity: Not on file  Stress: Not on file  Social Connections: Not on file  Intimate Partner Violence: Not on file     Review of Systems     Objective:   Physical Exam Constitutional:      General: She is not in acute distress.    Appearance: Normal appearance. She is normal weight. She is not ill-appearing or toxic-appearing.  Cardiovascular:     Rate and Rhythm: Normal rate and regular  rhythm.     Pulses: Normal pulses.     Heart sounds: Normal heart sounds. No murmur heard.   No friction rub.  Pulmonary:     Effort: Pulmonary effort is normal. No respiratory distress.     Breath sounds: Normal breath sounds. No stridor. No wheezing, rhonchi or rales.  Musculoskeletal:        General: No swelling, tenderness, deformity or signs of injury.     Right lower leg: Normal. No swelling, deformity, tenderness or bony tenderness. No edema.     Left lower leg: Normal. No swelling, deformity, tenderness or bony tenderness. No edema.  Neurological:     General: No focal deficit present.     Mental Status: She is alert and oriented to person, place, and time.     Cranial Nerves: No cranial nerve deficit.     Sensory: No sensory deficit.     Motor: No weakness.     Coordination: Coordination normal.     Gait: Gait normal.     Deep Tendon Reflexes: Reflexes normal.         Assessment & Plan:  Neuropathy - Plan: COMPLETE METABOLIC PANEL WITH GFR, TSH, Vitamin B12 Suspect peripheral neuropathy.  Check CMP TSH and B12.  If labs are normal, would recommend gabapentin and uptitrate as necessary for pain control.  Would start at 100 mg p.o. 3 times daily as needed given her size and age.  Refilled her Klonopin for sleep

## 2020-08-22 LAB — COMPLETE METABOLIC PANEL WITH GFR
AG Ratio: 1.6 (calc) (ref 1.0–2.5)
ALT: 18 U/L (ref 6–29)
AST: 15 U/L (ref 10–35)
Albumin: 4.5 g/dL (ref 3.6–5.1)
Alkaline phosphatase (APISO): 40 U/L (ref 37–153)
BUN/Creatinine Ratio: 19 (calc) (ref 6–22)
BUN: 19 mg/dL (ref 7–25)
CO2: 33 mmol/L — ABNORMAL HIGH (ref 20–32)
Calcium: 10.4 mg/dL (ref 8.6–10.4)
Chloride: 99 mmol/L (ref 98–110)
Creat: 0.99 mg/dL — ABNORMAL HIGH (ref 0.60–0.95)
Globulin: 2.8 g/dL (calc) (ref 1.9–3.7)
Glucose, Bld: 117 mg/dL — ABNORMAL HIGH (ref 65–99)
Potassium: 4 mmol/L (ref 3.5–5.3)
Sodium: 139 mmol/L (ref 135–146)
Total Bilirubin: 0.7 mg/dL (ref 0.2–1.2)
Total Protein: 7.3 g/dL (ref 6.1–8.1)
eGFR: 56 mL/min/{1.73_m2} — ABNORMAL LOW (ref >=60)

## 2020-08-22 LAB — TSH: TSH: 1.88 mIU/L (ref 0.40–4.50)

## 2020-08-22 LAB — VITAMIN B12: Vitamin B-12: 416 pg/mL (ref 200–1100)

## 2020-08-23 ENCOUNTER — Other Ambulatory Visit: Payer: Self-pay | Admitting: *Deleted

## 2020-08-23 DIAGNOSIS — M810 Age-related osteoporosis without current pathological fracture: Secondary | ICD-10-CM

## 2020-08-23 DIAGNOSIS — I10 Essential (primary) hypertension: Secondary | ICD-10-CM

## 2020-08-23 DIAGNOSIS — E782 Mixed hyperlipidemia: Secondary | ICD-10-CM

## 2020-08-23 DIAGNOSIS — F411 Generalized anxiety disorder: Secondary | ICD-10-CM

## 2020-08-23 MED ORDER — GABAPENTIN 100 MG PO CAPS: 100.0000 mg | ORAL_CAPSULE | Freq: Three times a day (TID) | ORAL | 3 refills | Status: DC | PRN

## 2020-09-07 ENCOUNTER — Ambulatory Visit: Payer: Self-pay

## 2020-09-07 ENCOUNTER — Other Ambulatory Visit: Payer: Self-pay

## 2020-09-07 ENCOUNTER — Ambulatory Visit (INDEPENDENT_AMBULATORY_CARE_PROVIDER_SITE_OTHER): Payer: PPO | Admitting: Pharmacist

## 2020-09-07 DIAGNOSIS — K219 Gastro-esophageal reflux disease without esophagitis: Secondary | ICD-10-CM

## 2020-09-07 DIAGNOSIS — I10 Essential (primary) hypertension: Secondary | ICD-10-CM

## 2020-09-07 NOTE — Progress Notes (Signed)
Chronic Care Management Pharmacy Note  09/07/2020 Name:  Brittany Levy MRN:  [id number] DOB:  January 27, 1935  Summary: Pharmd follow up visit.  Patient is concerned about her blood pressure and medications.  She is almost self medication taking her hydralazine and clonidine in various amounts and different times of day based on home monitoring.  Home readings: 146/66, 116/57, 133/57, 155/78, 118/67, 115/60 (3/4 of clonidine tablet), 138/64, 128/58 (no clonidine before), 145/61, 111/57, 133/63 (no clonidine before).  She was concerned and wanted to make sure she was doing the correct thing.   Recommendations/Changes made from today's visit: I would consider having her take everything for her BP and prescribed and taking clonidine prn for systolic > 741.  Plan: Consult with PCP on BP meds FU in 4 months OTP CMA 2 month BP check   Subjective: Brittany Levy is an 86 y.o. year old female who is a primary patient of Pickard, Cammie Mcgee, MD.  The CCM team was consulted for assistance with disease management and care coordination needs.    Engaged with patient face to face for follow up visit in response to provider referral for pharmacy case management and/or care coordination services.   Consent to Services:  The patient was given the following information about Chronic Care Management services today, agreed to services, and gave verbal consent: 1. CCM service includes personalized support from designated clinical staff supervised by the primary care provider, including individualized plan of care and coordination with other care providers 2. 24/7 contact phone numbers for assistance for urgent and routine care needs. 3. Service will only be billed when office clinical staff spend 20 minutes or more in a month to coordinate care. 4. Only one practitioner may furnish and bill the service in a calendar month. 5.The patient may stop CCM services at any time (effective at the end of the month) by  phone call to the office staff. 6. The patient will be responsible for cost sharing (co-pay) of up to 20% of the service fee (after annual deductible is met). Patient agreed to services and consent obtained.  Patient Care Team: Susy Frizzle, MD as PCP - General (Family Medicine) Edythe Clarity, [facility] as Pharmacist (Pharmacist)  Recent office visits: 08/21/20 Dennard Schaumann) - started on gabapentin 14m tid and can titrate dose as needed to control pain.  Recent consult visits: None recent  Hospital visits: None in previous 6 months   Objective:  Lab Results  Component Value Date   CREATININE 0.99 (H) 08/21/2020   BUN 19 08/21/2020   GFRNONAA 71 04/13/2020   GFRAA 83 04/13/2020   NA 139 08/21/2020   K 4.0 08/21/2020   CALCIUM 10.4 08/21/2020   CO2 33 (H) 08/21/2020   GLUCOSE 117 (H) 08/21/2020    No results found for: HGBA1C, FRUCTOSAMINE, GFR, MICROALBUR  Last diabetic Eye exam: No results found for: HMDIABEYEEXA  Last diabetic Foot exam: No results found for: HMDIABFOOTEX   Lab Results  Component Value Date   CHOL 139 04/13/2020   HDL 81 04/13/2020   LDLCALC 44 04/13/2020   TRIG 63 04/13/2020   CHOLHDL 1.7 04/13/2020    Hepatic Function Latest Ref Rng & Units 08/21/2020 04/13/2020 07/30/2019  Total Protein 6.1 - 8.1 g/dL 7.3 6.5 7.0  Albumin 3.6 - 5.1 g/dL - - -  AST 10 - 35 U/L '15 14 14  ' ALT 6 - 29 U/L '18 15 11  ' Alk Phosphatase 33 - 130 U/L - - -  Total Bilirubin 0.2 - 1.2 mg/dL 0.7 0.6 0.8    Lab Results  Component Value Date/Time   TSH 1.88 08/21/2020 02:20 PM   TSH 2.10 02/26/2016 08:10 AM    CBC Latest Ref Rng & Units 04/13/2020 07/30/2019 08/18/2018  WBC 3.8 - 10.8 Thousand/uL 5.7 6.6 5.9  Hemoglobin 11.7 - 15.5 g/dL 12.0 12.6 12.4  Hematocrit 35.0 - 45.0 % 36.5 37.6 37.3  Platelets 140 - 400 Thousand/uL 157 215 195    No results found for: VD25OH  Clinical ASCVD: No  The ASCVD Risk score Mikey Bussing DC Jr., et al., 2013) failed to calculate for the  following reasons:   The 2013 ASCVD risk score is only valid for ages 42 to 51    Depression screen PHQ 2/9 04/13/2020 08/18/2018 04/25/2017  Decreased Interest 0 1 0  Down, Depressed, Hopeless 0 1 -  PHQ - 2 Score 0 2 0  Altered sleeping 3 2 -  Tired, decreased energy 2 1 -  Change in appetite 1 1 -  Feeling bad or failure about yourself  0 0 -  Trouble concentrating 0 0 -  Moving slowly or fidgety/restless 0 0 -  Suicidal thoughts 0 0 -  PHQ-9 Score 6 6 -  Difficult doing work/chores - Somewhat difficult -     Social History   Tobacco Use  Smoking Status Never  Smokeless Tobacco Never   BP Readings from Last 3 Encounters:  08/21/20 130/72  04/13/20 140/64  09/30/19 (!) 160/80   Pulse Readings from Last 3 Encounters:  08/21/20 88  04/13/20 70  09/30/19 76   Wt Readings from Last 3 Encounters:  08/21/20 112 lb (50.8 kg)  04/13/20 111 lb (50.3 kg)  09/30/19 114 lb (51.7 kg)   BMI Readings from Last 3 Encounters:  08/21/20 19.84 kg/m  04/13/20 19.66 kg/m  09/30/19 20.19 kg/m    Assessment/Interventions: Review of patient past medical history, allergies, medications, health status, including review of consultants reports, laboratory and other test data, was performed as part of comprehensive evaluation and provision of chronic care management services.   SDOH:  (Social Determinants of Health) assessments and interventions performed: Yes  Financial Resource Strain: Low Risk    Difficulty of Paying Living Expenses: Not very hard    SDOH Screenings   Alcohol Screen: Low Risk    Last Alcohol Screening Score (AUDIT): 0  Depression (PHQ2-9): Medium Risk   PHQ-2 Score: 6  Financial Resource Strain: Low Risk    Difficulty of Paying Living Expenses: Not very hard  Food Insecurity: Not on file  Housing: Not on file  Physical Activity: Not on file  Social Connections: Not on file  Stress: Not on file  Tobacco Use: Low Risk    Smoking Tobacco Use: Never    Smokeless Tobacco Use: Never  Transportation Needs: Not on file    [place]  No Known Allergies  Medications Reviewed Today     Reviewed by Edythe Clarity, [facility] (Pharmacist) on 09/07/20 at 1443  Med List Status: <None>   Medication Order Taking? Sig Documenting Provider Last Dose Status Informant  aspirin 81 MG tablet [id number] Yes Take 81 mg by mouth daily. [provider] Taking Active   atorvastatin (LIPITOR) 20 MG tablet [id number] Yes TAKE 1 TABLET BY MOUTH ONCE A DAY Pickard, Cammie Mcgee, MD Taking Active   calcium-vitamin D (OSCAL WITH D) 250-125 MG-UNIT per tablet [id number] Yes Take 1 tablet by mouth 2 (two) times daily. [provider] Taking Active   carvedilol (COREG) 6.25 MG tablet [id number] Yes TAKE 1 TABLET BY MOUTH TWICE A DAY WITH A MEAL Pickard, Cammie Mcgee, MD Taking Active   clonazePAM (KLONOPIN) 0.5 MG tablet [id number] Yes TAKE 1 TABLET BY MOUTH TWICE A DAY AS NEEDED FOR ANXIETY Susy Frizzle, MD Taking Active   cloNIDine (CATAPRES) 0.2 MG tablet [id number] Yes TAKE 1 TABLET BY MOUTH 3 TIMES DAILY. STOP ATENOLOL AND CLONIDINE PATCH. Susy Frizzle, MD Taking Active   denosumab [facility]) injection 60 mg [id number]   Susy Frizzle, MD  Active   felodipine (PLENDIL) 5 MG 24 hr tablet [id number] Yes TAKE 1 TABLET BY MOUTH ONCE DAILY Susy Frizzle, MD Taking Active   gabapentin (NEURONTIN) 100 MG capsule [id number] Yes Take 1 capsule (100 mg total) by mouth 3 (three) times daily as needed. Susy Frizzle, MD Taking Active   hydrALAZINE (APRESOLINE) 25 MG tablet [id number] Yes TAKE TWO TABLETS BY MOUTH TWICE A DAY Susy Frizzle, MD Taking Active   hydrochlorothiazide (HYDRODIURIL) 25 MG tablet [id number] Yes TAKE 1 TABLET BY MOUTH ONCE A DAY Bates, Crystal A, FNP Taking Active   irbesartan (AVAPRO) 300 MG tablet [id number] Yes TAKE 1 TABLET BY MOUTH ONCE A DAY Susy Frizzle, MD Taking Active   Omega-3 Fatty Acids (FISH OIL PO) [id number] Yes  Take 2 capsules by mouth.  [provider] Taking Active   omeprazole (PRILOSEC) 40 MG capsule [id number] Yes TAKE 1 CAPSULE BY MOUTH ONCE DAILY Susy Frizzle, MD Taking Active   potassium chloride (KLOR-CON) 10 MEQ tablet [id number] Yes Take 1 tablet (10 mEq total) by mouth daily. Susy Frizzle, MD Taking Active             Patient Active Problem List   Diagnosis Date Noted   GAD (generalized anxiety disorder) 09/08/2018   Osteoporosis 06/25/2016   Insomnia    Dyspnea 05/12/2012   Hypertension    GERD (gastroesophageal reflux disease)     Immunization History  Administered Date(s) Administered   Fluad Quad(high Dose 65+) 10/08/2018   Influenza Split 10/18/2010   Influenza,inj,Quad PF,6+ Mos 10/08/2012, 10/06/2013, 10/09/2016, 11/14/2017   Influenza-Unspecified 03/09/2015, 12/07/2015   PFIZER(Purple Top)SARS-COV-2 Vaccination 02/21/2019, 03/11/2019   Pneumococcal Conjugate-13 02/12/2013   Pneumococcal Polysaccharide-23 02/05/1999, 02/15/2014   Zoster, Live 09/18/2011    Conditions to be addressed/monitored:  Hypertension, GERD, and Osteoporosis  Care Plan : General Pharmacy (Adult)  Updates made by Edythe Clarity, RPH since 09/07/2020 12:00 AM     Problem: HTN, Ostopenia   Priority: High  Onset Date: 09/07/2020     Long-Range Goal: Patient-Specific Goal   Start Date: 09/07/2020  Expected End Date: 03/10/2021  This Visit's Progress: On track  Priority: High  Note:   Current Barriers:  Unable to self administer medications as prescribed  Pharmacist Clinical Goal(s):  Patient will achieve adherence to monitoring guidelines and medication adherence to achieve therapeutic efficacy adhere to plan to optimize therapeutic regimen for HTN as evidenced by report of adherence to recommended medication management changes contact provider office for questions/concerns as evidenced notation of same in electronic health record through collaboration with PharmD and  provider.   Interventions: 1:1 collaboration with Susy Frizzle, MD regarding development and update of comprehensive plan of care as evidenced by provider attestation and co-signature Inter-disciplinary care team collaboration (see longitudinal plan of care) Comprehensive medication review performed; medication list updated in electronic medical record  Hypertension  (Status:Goal on track: YES.)  Med Management Intervention: Medication adjusted and home monitoring reviewed  (BP goal <140/90) -Controlled -Current treatment: Irbesartan 365m daily HCTZ 271mdaily Hydralazine 2512maily Felodipine 5mg93mily Carvedilol 6.25mg82m Clonidine 0.2mg -52mtient taking twice daily most days -Medications previously tried: none noted  -Current home readings: 146/66, 116/57, 133/57, 155/78, 118/67, 115/60, 138/64, 128/58, 145/61, 111/57, 133/63  -Denies hypotensive/hypertensive symptoms -Educated on BP goals and benefits of medications for prevention of heart attack, stroke and kidney damage; Daily salt intake goal < 2300 mg; Importance of home blood pressure monitoring; Proper BP monitoring technique; Symptoms of hypotension and importance of maintaining adequate hydration; -Counseled to monitor BP at home daily, document, and provide log at future appointments -Patient was kind of self medication and taking medications on as needed basis to control her BP.  For the most part her readings were great with only a few over goal. -She would prefer to have scheduled dosing for ease of use and better adherence.  I would consider having her just take everything as prescribed and to take the clonidine only prn if systolic BP > 150.  550l consult with PCP to see what he thinks moving forward.  Osteopenia (Goal Maintain bone density) -Controlled -Last DEXA Scan: 07/06/20   T-Score femoral neck: -2.3  T-Score lumbar spine: 0.1  10-year probability of major osteoporotic fracture: 15%  10-year  probability of hip fracture: 5.2% -Patient is a candidate for pharmacologic treatment due to T-Score -1.0 to -2.5 and 10-year risk of hip fracture > 3% -Current treatment  Prolia 60mg -69mcations previously tried: none noted  -Recommend [phone] units of vitamin D daily. Recommend 1200 mg of calcium daily from dietary and supplemental sources. Recommend weight-bearing and muscle strengthening exercises for building and maintaining bone density. -Recommended to continue current medication  Patient Goals/Self-Care Activities Patient will:  - take medications as prescribed check blood pressure daily, document, and provide at future appointments target a minimum of 150 minutes of moderate intensity exercise weekly  Follow Up Plan: The care management team will reach out to the patient again over the next 120 days.         Medication Assistance: None required.  Patient affirms current coverage meets needs.  Compliance/Adherence/Medication fill history: Care Gaps: None at this time [id number] [phone] box? Yes Pt endorses 100% compliance  We discussed: Benefits of medication synchronization, packaging and delivery as well as enhanced pharmacist oversight with [facility]. Patient decided to: Continue current medication management strategy  Care Plan and Follow Up Patient Decision:  Patient agrees to Care Plan and Follow-up.  Plan: The care management team will reach out to the patient again over the next 120 days.  ChristiBeverly MilchD Clinical Pharmacist [place] [phone]

## 2020-09-07 NOTE — Patient Instructions (Addendum)
Visit Information   Goals Addressed             This Visit's Progress    Track and Manage My Blood Pressure-Hypertension       Timeframe:  Long-Range Goal Priority:  High Start Date:    09/07/20                         Expected End Date:   03/10/21                    Follow Up Date 01/03/21    - check blood pressure daily - choose a place to take my blood pressure (home, clinic or office, retail store) - write blood pressure results in a log or diary    Why is this important?   You won't feel high blood pressure, but it can still hurt your blood vessels.  High blood pressure can cause heart or kidney problems. It can also cause a stroke.  Making lifestyle changes like losing a little weight or eating less salt will help.  Checking your blood pressure at home and at different times of the day can help to control blood pressure.  If the doctor prescribes medicine remember to take it the way the doctor ordered.  Call the office if you cannot afford the medicine or if there are questions about it.     Notes:        Patient Care Plan: General Pharmacy (Adult)     Problem Identified: HTN, Ostopenia   Priority: High  Onset Date: 09/07/2020     Long-Range Goal: Patient-Specific Goal   Start Date: 09/07/2020  Expected End Date: 03/10/2021  This Visit's Progress: On track  Priority: High  Note:   Current Barriers:  Unable to self administer medications as prescribed  Pharmacist Clinical Goal(s):  Patient will achieve adherence to monitoring guidelines and medication adherence to achieve therapeutic efficacy adhere to plan to optimize therapeutic regimen for HTN as evidenced by report of adherence to recommended medication management changes contact provider office for questions/concerns as evidenced notation of same in electronic health record through collaboration with PharmD and provider.   Interventions: 1:1 collaboration with Donita Brooks, MD regarding development  and update of comprehensive plan of care as evidenced by provider attestation and co-signature Inter-disciplinary care team collaboration (see longitudinal plan of care) Comprehensive medication review performed; medication list updated in electronic medical record  Hypertension  (Status:Goal on track: YES.)   Med Management Intervention: Medication adjusted and home monitoring reviewed  (BP goal <140/90) -Controlled -Current treatment: Irbesartan 300mg  daily HCTZ 25mg  daily Hydralazine 25mg  daily Felodipine 5mg  daily Carvedilol 6.25mg  BID Clonidine 0.2mg  - patient taking twice daily most days -Medications previously tried: none noted  -Current home readings: 146/66, 116/57, 133/57, 155/78, 118/67, 115/60, 138/64, 128/58, 145/61, 111/57, 133/63  -Denies hypotensive/hypertensive symptoms -Educated on BP goals and benefits of medications for prevention of heart attack, stroke and kidney damage; Daily salt intake goal < 2300 mg; Importance of home blood pressure monitoring; Proper BP monitoring technique; Symptoms of hypotension and importance of maintaining adequate hydration; -Counseled to monitor BP at home daily, document, and provide log at future appointments -Patient was kind of self medication and taking medications on as needed basis to control her BP.  For the most part her readings were great with only a few over goal. -She would prefer to have scheduled dosing for ease of use and better adherence.  I  would consider having her just take everything as prescribed and to take the clonidine only prn if systolic BP > 150.  Will consult with PCP to see what he thinks moving forward.  Osteopenia (Goal Maintain bone density) -Controlled -Last DEXA Scan: 07/06/20   T-Score femoral neck: -2.3  T-Score lumbar spine: 0.1  10-year probability of major osteoporotic fracture: 15%  10-year probability of hip fracture: 5.2% -Patient is a candidate for pharmacologic treatment due to T-Score  -1.0 to -2.5 and 10-year risk of hip fracture > 3% -Current treatment  Prolia 60mg  -Medications previously tried: none noted  -Recommend [phone] units of vitamin D daily. Recommend 1200 mg of calcium daily from dietary and supplemental sources. Recommend weight-bearing and muscle strengthening exercises for building and maintaining bone density. -Recommended to continue current medication  Patient Goals/Self-Care Activities Patient will:  - take medications as prescribed check blood pressure daily, document, and provide at future appointments target a minimum of 150 minutes of moderate intensity exercise weekly  Follow Up Plan: The care management team will reach out to the patient again over the next 120 days.         Patient verbalizes understanding of instructions provided today and agrees to view in MyChart.  Telephone follow up appointment with pharmacy team member scheduled for: 4 months  , Erroll Luna  [place]

## 2020-09-11 ENCOUNTER — Other Ambulatory Visit: Payer: Self-pay | Admitting: Family Medicine

## 2020-09-25 ENCOUNTER — Other Ambulatory Visit: Payer: Self-pay | Admitting: Family Medicine

## 2020-09-25 ENCOUNTER — Telehealth: Payer: Self-pay | Admitting: Pharmacist

## 2020-09-25 NOTE — Progress Notes (Signed)
Error

## 2020-09-29 ENCOUNTER — Other Ambulatory Visit: Payer: Self-pay

## 2020-09-29 ENCOUNTER — Ambulatory Visit (INDEPENDENT_AMBULATORY_CARE_PROVIDER_SITE_OTHER): Payer: PPO | Admitting: *Deleted

## 2020-09-29 DIAGNOSIS — M810 Age-related osteoporosis without current pathological fracture: Secondary | ICD-10-CM | POA: Diagnosis not present

## 2020-10-13 DIAGNOSIS — J301 Allergic rhinitis due to pollen: Secondary | ICD-10-CM | POA: Diagnosis not present

## 2020-10-13 DIAGNOSIS — H6123 Impacted cerumen, bilateral: Secondary | ICD-10-CM | POA: Diagnosis not present

## 2020-10-13 DIAGNOSIS — H90A21 Sensorineural hearing loss, unilateral, right ear, with restricted hearing on the contralateral side: Secondary | ICD-10-CM | POA: Diagnosis not present

## 2020-10-23 ENCOUNTER — Other Ambulatory Visit: Payer: Self-pay | Admitting: Family Medicine

## 2020-10-30 ENCOUNTER — Other Ambulatory Visit: Payer: Self-pay | Admitting: *Deleted

## 2020-10-30 MED ORDER — HYDROCHLOROTHIAZIDE 25 MG PO TABS: 25.0000 mg | ORAL_TABLET | Freq: Every day | ORAL | 3 refills | Status: DC

## 2020-11-15 ENCOUNTER — Telehealth: Payer: Self-pay | Admitting: Pharmacist

## 2020-11-15 NOTE — Progress Notes (Signed)
Chronic Care Management Pharmacy Assistant   Name: Brittany Levy  MRN: [id number] DOB: Dec 09, 1934  Reason for Encounter: Disease State For HTN.   Conditions to be addressed/monitored: Hypertension, GERD, and Osteoporosis  Recent office visits:  None since 09/07/20  Recent consult visits:  None since 09/07/20  Hospital visits:  None since 09/07/20  Medications: Outpatient Encounter Medications as of 11/15/2020  Medication Sig   aspirin 81 MG tablet Take 81 mg by mouth daily.   atorvastatin (LIPITOR) 20 MG tablet TAKE 1 TABLET BY MOUTH ONCE A DAY   calcium-vitamin D (OSCAL WITH D) 250-125 MG-UNIT per tablet Take 1 tablet by mouth 2 (two) times daily.   carvedilol (COREG) 6.25 MG tablet TAKE 1 TABLET BY MOUTH TWICE A DAY WITH A MEAL   clonazePAM (KLONOPIN) 0.5 MG tablet TAKE 1 TABLET BY MOUTH TWICE A DAY AS NEEDED FOR ANXIETY   cloNIDine (CATAPRES) 0.2 MG tablet TAKE 1 TABLET BY MOUTH 3 TIMES DAILY. STOP ATENOLOL AND CLONIDINE PATCH.   felodipine (PLENDIL) 5 MG 24 hr tablet TAKE 1 TABLET BY MOUTH ONCE DAILY   gabapentin (NEURONTIN) 100 MG capsule Take 1 capsule (100 mg total) by mouth 3 (three) times daily as needed.   hydrALAZINE (APRESOLINE) 25 MG tablet TAKE TWO TABLETS BY MOUTH TWICE A DAY   hydrochlorothiazide (HYDRODIURIL) 25 MG tablet Take 1 tablet (25 mg total) by mouth daily.   irbesartan (AVAPRO) 300 MG tablet TAKE 1 TABLET BY MOUTH ONCE A DAY   Omega-3 Fatty Acids (FISH OIL PO) Take 2 capsules by mouth.    omeprazole (PRILOSEC) 40 MG capsule TAKE 1 CAPSULE BY MOUTH ONCE DAILY   potassium chloride (KLOR-CON) 10 MEQ tablet Take 1 tablet (10 mEq total) by mouth daily.   Facility-Administered Encounter Medications as of 11/15/2020  Medication   denosumab (PROLIA) injection 60 mg   Reviewed chart prior to disease state call. Spoke with patient regarding BP  Recent Office Vitals: BP Readings from Last 3 Encounters:  08/21/20 130/72  04/13/20 140/64  09/30/19 (!)  160/80   Pulse Readings from Last 3 Encounters:  08/21/20 88  04/13/20 70  09/30/19 76    Wt Readings from Last 3 Encounters:  08/21/20 112 lb (50.8 kg)  04/13/20 111 lb (50.3 kg)  09/30/19 114 lb (51.7 kg)     Kidney Function Lab Results  Component Value Date/Time   CREATININE 0.99 (H) 08/21/2020 02:20 PM   CREATININE 0.77 04/13/2020 10:35 AM   GFRNONAA 71 04/13/2020 10:35 AM   GFRAA 83 04/13/2020 10:35 AM    BMP Latest Ref Rng & Units 08/21/2020 04/13/2020 07/30/2019  Glucose 65 - 99 mg/dL 884(Z) 660(Y) 301(S)  BUN 7 - 25 mg/dL 19 21 20   Creatinine 0.60 - 0.95 mg/dL ) 0.10(X 3.23)  BUN/Creat Ratio 6 - 22 (calc) 19 NOT APPLICABLE 22  Sodium 135 - 146 mmol/L 139 139 140  Potassium 3.5 - 5.3 mmol/L 4.0 4.3 4.1  Chloride 98 - 110 mmol/L 99 101 99  CO2 20 - 32 mmol/L 33(H) 31 30  Calcium 8.6 - 10.4 mg/dL 5.57(D 22.0 25.4    Current antihypertensive regimen:  Irbesartan 300mg  daily HCTZ 25mg  daily Hydralazine 25mg  daily Felodipine 5mg  daily Carvedilol 6.25mg  BID Clonidine 0.2mg  - patient taking twice daily most days  How often are you checking your Blood Pressure? Patient stated daily  Current home BP readings: Patient stated her blood pressure readings do elevate in the afternoon.  What recent interventions/DTPs have been made  by any provider to improve Blood Pressure control since last CPP Visit: None.   Any recent hospitalizations or ED visits since last visit with CPP? Patient stated no.   What diet changes have been made to improve Blood Pressure Control?  Patient stated her diet is good. She stated she drinks water daily.   What exercise is being done to improve your Blood Pressure Control?  Patient stated she is extremely busy. She is consistently doing something daily.  Adherence Review: Is the patient currently on ACE/ARB medication? Ibesartan 300 mg  Does the patient have >5 day gap between last estimated fill dates? Per misc rpts, no.   Care  Gaps:Patient is due for her AWV. Scheduled for 12/08/20 at 9 am.   Star Rating Drugs:Ibesartan 300 mg 09/25/20 90 DS, Atorvastatin 20 mg 09/11/20 90 DS.  Follow-Up:Pharmacist Review  Hulen Luster, RMA Clinical Pharmacist Assistant [phone]

## 2020-12-08 ENCOUNTER — Ambulatory Visit: Payer: PPO

## 2020-12-15 ENCOUNTER — Other Ambulatory Visit: Payer: Self-pay

## 2020-12-15 ENCOUNTER — Ambulatory Visit (INDEPENDENT_AMBULATORY_CARE_PROVIDER_SITE_OTHER): Payer: PPO

## 2020-12-15 VITALS — BP 128/68 | HR 83 | Ht 61.0 in | Wt 112.0 lb

## 2020-12-15 DIAGNOSIS — Z0000 Encounter for general adult medical examination without abnormal findings: Secondary | ICD-10-CM

## 2020-12-15 NOTE — Progress Notes (Signed)
Subjective:   MAKYA PHILLIS is a 86 y.o. female who presents for Medicare Annual (Subsequent) preventive examination.  Review of Systems     Cardiac Risk Factors include: advanced age (>24men, >64 women);hypertension;sedentary lifestyle     Objective:    Today's Vitals   12/15/20 0852  BP: 128/68  Pulse: 83  SpO2: 99%  Weight: 112 lb (50.8 kg)  Height: 5\' 1"  (1.549 m)   Body mass index is 21.16 kg/m.  Advanced Directives 12/15/2020  Does Patient Have a Medical Advance Directive? Yes  Type of Advance Directive Living will  Would patient like information on creating a medical advance directive? No - Patient declined    Current Medications (verified) Outpatient Encounter Medications as of 12/15/2020  Medication Sig   aspirin 81 MG tablet Take 81 mg by mouth daily.   atorvastatin (LIPITOR) 20 MG tablet TAKE 1 TABLET BY MOUTH ONCE A DAY   calcium-vitamin D (OSCAL WITH D) 250-125 MG-UNIT per tablet Take 1 tablet by mouth 2 (two) times daily.   carvedilol (COREG) 6.25 MG tablet TAKE 1 TABLET BY MOUTH TWICE A DAY WITH A MEAL   clonazePAM (KLONOPIN) 0.5 MG tablet TAKE 1 TABLET BY MOUTH TWICE A DAY AS NEEDED FOR ANXIETY   cloNIDine (CATAPRES) 0.2 MG tablet TAKE 1 TABLET BY MOUTH 3 TIMES DAILY. STOP ATENOLOL AND CLONIDINE PATCH.   felodipine (PLENDIL) 5 MG 24 hr tablet TAKE 1 TABLET BY MOUTH ONCE DAILY   FLUAD QUADRIVALENT 0.5 ML injection    gabapentin (NEURONTIN) 100 MG capsule Take 1 capsule (100 mg total) by mouth 3 (three) times daily as needed.   hydrALAZINE (APRESOLINE) 25 MG tablet TAKE TWO TABLETS BY MOUTH TWICE A DAY   hydrochlorothiazide (HYDRODIURIL) 25 MG tablet Take 1 tablet (25 mg total) by mouth daily.   irbesartan (AVAPRO) 300 MG tablet TAKE 1 TABLET BY MOUTH ONCE A DAY   Omega-3 Fatty Acids (FISH OIL PO) Take 2 capsules by mouth.    omeprazole (PRILOSEC) 40 MG capsule TAKE 1 CAPSULE BY MOUTH ONCE DAILY   potassium chloride (KLOR-CON) 10 MEQ tablet Take 1  tablet (10 mEq total) by mouth daily.   Facility-Administered Encounter Medications as of 12/15/2020  Medication   denosumab (PROLIA) injection 60 mg    Allergies (verified) Patient has no known allergies.   History: Past Medical History:  Diagnosis Date   Chest pain    GERD (gastroesophageal reflux disease)    Hypertension    Insomnia    Osteoporosis of femur without pathological fracture    Past Surgical History:  Procedure Laterality Date   ABDOMINAL HYSTERECTOMY     BREAST BIOPSY Right 05/07/2016   FRAGMENTS OF LYMPH NODE   Family History  Problem Relation Age of Onset   Congestive Heart Failure Mother    Stroke Father    Hypertension Father    Breast cancer Sister 6   Heart disease Brother    Heart disease Brother    Breast cancer Maternal Aunt 68   Breast cancer Paternal Aunt        after menopause   Social History   Socioeconomic History   Marital status: Widowed    Spouse name: Not on file   Number of children: 1   Years of education: Not on file   Highest education level: Not on file  Occupational History   Not on file  Tobacco Use   Smoking status: Never   Smokeless tobacco: Never  Substance and Sexual Activity  Alcohol use: Yes    Comment: wine occasionally   Drug use: No   Sexual activity: Not on file  Other Topics Concern   Not on file  Social History Narrative   Husband passed in 2021.   1 daughter.   2 grand children.   Social Determinants of Health   Financial Resource Strain: Low Risk    Difficulty of Paying Living Expenses: Not hard at all  Food Insecurity: No Food Insecurity   Worried About Programme researcher, broadcasting/film/video in the Last Year: Never true   Ran Out of Food in the Last Year: Never true  Transportation Needs: No Transportation Needs   Lack of Transportation (Medical): No   Lack of Transportation (Non-Medical): No  Physical Activity: Insufficiently Active   Days of Exercise per Week: 3 days   Minutes of Exercise per Session:  10 min  Stress: No Stress Concern Present   Feeling of Stress : Only a little  Social Connections: Moderately Integrated   Frequency of Communication with Friends and Family: More than three times a week   Frequency of Social Gatherings with Friends and Family: More than three times a week   Attends Religious Services: More than 4 times per year   Active Member of [employer] or Organizations: Yes   Attends Banker Meetings: More than 4 times per year   Marital Status: Widowed    Tobacco Counseling Counseling given: Not Answered   Clinical Intake:  Pre-visit preparation completed: Yes  Pain : No/denies pain     BMI - recorded: 21.16 Nutritional Status: BMI of 19-24  Normal Nutritional Risks: None Diabetes: No  How often do you need to have someone help you when you read instructions, pamphlets, or other written materials from your doctor or pharmacy?: 1 - Never  Diabetic?NO  Interpreter Needed?: No  Information entered by :: MJ Jomes Giraldo, LPN   Activities of Daily Living In your present state of health, do you have any difficulty performing the following activities: 12/15/2020  Hearing? Y  Vision? N  Difficulty concentrating or making decisions? N  Walking or climbing stairs? N  Dressing or bathing? N  Doing errands, shopping? N  Preparing Food and eating ? N  Using the Toilet? N  In the past six months, have you accidently leaked urine? Y  Comment Stress incontinence  Do you have problems with loss of bowel control? N  Managing your Medications? N  Managing your Finances? N  Housekeeping or managing your Housekeeping? N  Some recent data might be hidden    Patient Care Team: Donita Brooks, MD as PCP - General (Family Medicine) Erroll Luna, [facility] as Pharmacist (Pharmacist)  Indicate any recent Medical Services you may have received from other than [facility] providers in the past year (date may be approximate).     Assessment:   This is a  routine wellness examination for [facility].  Hearing/Vision screen Hearing Screening - Comments:: Hearing loss L ear. Vision Screening - Comments:: Contacts. Dr. Hyacinth Meeker. 2022.  Dietary issues and exercise activities discussed: Current Exercise Habits: Home exercise routine, Type of exercise: walking, Time (Minutes): 20, Frequency (Times/Week): 3, Weekly Exercise (Minutes/Week): 60, Intensity: Mild, Exercise limited by: cardiac condition(s)   Goals Addressed             This Visit's Progress    Exercise 3x per week (30 min per time)       Continue to maintain good health.  Depression Screen PHQ 2/9 Scores 12/15/2020 04/13/2020 08/18/2018 04/25/2017 04/25/2017 06/25/2016 02/29/2016  PHQ - 2 Score 0 0 2 0 0 0 0  PHQ- 9 Score - 6 6 - 3 0 -    Fall Risk Fall Risk  12/15/2020 04/13/2020 12/30/2018 04/25/2017 04/25/2017  Falls in the past year? 0 0 0 No No  Comment - - Emmi Telephone Survey: data to providers prior to load - -  Number falls in past yr: 0 - - - -  Injury with Fall? 0 - - - -  Risk for fall due to : No Fall Risks No Fall Risks - - -  Follow up Falls prevention discussed Falls evaluation completed - - -    FALL RISK PREVENTION PERTAINING TO THE HOME:  Any stairs in or around the home? No  If so, are there any without handrails? No  Home free of loose throw rugs in walkways, pet beds, electrical cords, etc? Yes  Adequate lighting in your home to reduce risk of falls? Yes   ASSISTIVE DEVICES UTILIZED TO PREVENT FALLS:  Life alert? Yes  Use of a cane, walker or w/c? No  Grab bars in the bathroom? No  Shower chair or bench in shower? No  Elevated toilet seat or a handicapped toilet? Yes   TIMED UP AND GO:  Was the test performed? Yes .  Length of time to ambulate 10 feet: 8 sec.   Gait steady and fast without use of assistive device  Cognitive Function:     6CIT Screen 12/15/2020  What Year? 0 points  What month? 0 points  What time? 0 points  Count back  from 20 0 points  Months in reverse 0 points  Repeat phrase 2 points  Total Score 2    Immunizations Immunization History  Administered Date(s) Administered   Fluad Quad(high Dose 65+) 10/08/2018   Influenza Split 10/18/2010   Influenza,inj,Quad PF,6+ Mos 10/08/2012, 10/06/2013, 10/09/2016, 11/14/2017   Influenza-Unspecified 03/09/2015, 12/07/2015, 11/14/2020   PFIZER(Purple Top)SARS-COV-2 Vaccination 02/21/2019, 03/11/2019   Pneumococcal Conjugate-13 02/12/2013   Pneumococcal Polysaccharide-23 02/05/1999, 02/15/2014   Zoster, Live 09/18/2011    TDAP status: Due, Education has been provided regarding the importance of this vaccine. Advised may receive this vaccine at local pharmacy or Health Dept. Aware to provide a copy of the vaccination record if obtained from local pharmacy or Health Dept. Verbalized acceptance and understanding.  Flu Vaccine status: Up to date  Pneumococcal vaccine status: Due, Education has been provided regarding the importance of this vaccine. Advised may receive this vaccine at local pharmacy or Health Dept. Aware to provide a copy of the vaccination record if obtained from local pharmacy or Health Dept. Verbalized acceptance and understanding.  Covid-19 vaccine status: Information provided on how to obtain vaccines.   Qualifies for Shingles Vaccine? Yes   Zostavax completed Yes   Shingrix Completed?: No.    Education has been provided regarding the importance of this vaccine. Patient has been advised to call insurance company to determine out of pocket expense if they have not yet received this vaccine. Advised may also receive vaccine at local pharmacy or Health Dept. Verbalized acceptance and understanding.  Screening Tests Health Maintenance  Topic Date Due   TETANUS/TDAP  Never done   Zoster Vaccines- Shingrix (1 of 2) Never done   COVID-19 Vaccine (3 - Booster for Pfizer series) 05/06/2019   Pneumonia Vaccine 85+ Years old  Completed   INFLUENZA  VACCINE  Completed   DEXA SCAN  Completed   HPV VACCINES  Aged Out    Health Maintenance  Health Maintenance Due  Topic Date Due   TETANUS/TDAP  Never done   Zoster Vaccines- Shingrix (1 of 2) Never done   COVID-19 Vaccine (3 - Booster for Pfizer series) 05/06/2019    Colorectal cancer screening: Type of screening: Colonoscopy. Completed 04/16/2012. Repeat every No longer required due to age years  Mammogram status: No longer required due to AGE.  Bone Density status: Completed 07/06/2020. Results reflect: Bone density results: OSTEOPOROSIS. Repeat every 2 years.  Lung Cancer Screening: (Low Dose CT Chest recommended if Age 34-80 years, 30 pack-year currently smoking OR have quit w/in 15years.) does not qualify.  NON-SMOKER  Additional Screening:  Hepatitis C Screening: does not qualify.  Vision Screening: Recommended annual ophthalmology exams for early detection of glaucoma and other disorders of the eye. Is the patient up to date with their annual eye exam?  Yes  Who is the provider or what is the name of the office in which the patient attends annual eye exams? Dr. Blima Ledger If pt is not established with a provider, would they like to be referred to a provider to establish care? No .   Dental Screening: Recommended annual dental exams for proper oral hygiene  Community Resource Referral / Chronic Care Management: CRR required this visit?  No   CCM required this visit?  No      Plan:     I have personally reviewed and noted the following in the patient's chart:   Medical and social history Use of alcohol, tobacco or illicit drugs  Current medications and supplements including opioid prescriptions.  Functional ability and status Nutritional status Physical activity Advanced directives List of other physicians Hospitalizations, surgeries, and ER visits in previous 12 months Vitals Screenings to include cognitive, depression, and falls Referrals and  appointments  In addition, I have reviewed and discussed with patient certain preventive protocols, quality metrics, and best practice recommendations. A written personalized care plan for preventive services as well as general preventive health recommendations were provided to patient.     Darral Dash, LPN   79/39/0300   Nurse Notes: Pt up to date on health maintenance. No longer does mammograms due to age.

## 2020-12-15 NOTE — Patient Instructions (Signed)
Brittany Levy , Thank you for taking time to come for your Medicare Wellness Visit. I appreciate your ongoing commitment to your health goals. Please review the following plan we discussed and let me know if I can assist you in the future.   Screening recommendations/referrals: Colonoscopy: Done 04/16/2012. No repeat required.  Mammogram: No longer required due to age.  Bone Density: Done 07/06/20 Repeat every 2 years  Recommended yearly ophthalmology/optometry visit for glaucoma screening and checkup Recommended yearly dental visit for hygiene and checkup  Vaccinations: Influenza vaccine: 11/14/2020 Repeat annually  Pneumococcal vaccine: Done 02/05/1999, 02/12/2013, 1/812/16 Tdap vaccine: Due Repeat in 10 years  Shingles vaccine: Shingrix discussed. Please contact your pharmacy for coverage information.     Covid-19:Done 02/11/2019 and 03/11/2019  Advanced directives: Advance directive discussed with you today. Even though you declined this today, please call our office should you change your mind, and we can give you the proper paperwork for you to fill out.   Conditions/risks identified: KEEP UP THE GOOD WORK!!  Next appointment: Follow up in one year for your annual wellness visit 2023.   Preventive Care 84 Years and Older, Female Preventive care refers to lifestyle choices and visits with your health care provider that can promote health and wellness. What does preventive care include? A yearly physical exam. This is also called an annual well check. Dental exams once or twice a year. Routine eye exams. Ask your health care provider how often you should have your eyes checked. Personal lifestyle choices, including: Daily care of your teeth and gums. Regular physical activity. Eating a healthy diet. Avoiding tobacco and drug use. Limiting alcohol use. Practicing safe sex. Taking low-dose aspirin every day. Taking vitamin and mineral supplements as recommended by your health care  provider. What happens during an annual well check? The services and screenings done by your health care provider during your annual well check will depend on your age, overall health, lifestyle risk factors, and family history of disease. Counseling  Your health care provider may ask you questions about your: Alcohol use. Tobacco use. Drug use. Emotional well-being. Home and relationship well-being. Sexual activity. Eating habits. History of falls. Memory and ability to understand (cognition). Work and work Astronomer. Reproductive health. Screening  You may have the following tests or measurements: Height, weight, and BMI. Blood pressure. Lipid and cholesterol levels. These may be checked every 5 years, or more frequently if you are over 35 years old. Skin check. Lung cancer screening. You may have this screening every year starting at age 84 if you have a 30-pack-year history of smoking and currently smoke or have quit within the past 84 years Fecal occult blood test (FOBT) of the stool. You may have this test every year starting at age 84. Flexible sigmoidoscopy or colonoscopy. You may have a sigmoidoscopy every 5 years or a colonoscopy every 10 years starting at age 84. Hepatitis C blood test. Hepatitis B blood test. Sexually transmitted disease (STD) testing. Diabetes screening. This is done by checking your blood sugar (glucose) after you have not eaten for a while (fasting). You may have this done every 1-3 years. Bone density scan. This is done to screen for osteoporosis. You may have this done starting at age 84. Mammogram. This may be done every 1-2 years. Talk to your health care provider about how often you should have regular mammograms. Talk with your health care provider about your test results, treatment options, and if necessary, the need for more tests. Vaccines  Your health care provider may recommend certain vaccines, such as: Influenza vaccine. This is  recommended every year. Tetanus, diphtheria, and acellular pertussis (Tdap, Td) vaccine. You may need a Td booster every 10 years. Zoster vaccine. You may need this after age 84. Pneumococcal 13-valent conjugate (PCV13) vaccine. One dose is recommended after age 84. Pneumococcal polysaccharide (PPSV23) vaccine. One dose is recommended after age 84. Talk to your health care provider about which screenings and vaccines you need and how often you need them. This information is not intended to replace advice given to you by your health care provider. Make sure you discuss any questions you have with your health care provider. Document Released: 02/17/2015 Document Revised: 10/11/2015 Document Reviewed: 11/22/2014 Elsevier Interactive Patient Education  2017 [place] Prevention in the Home Falls can cause injuries. They can happen to people of all ages. There are many things you can do to make your home safe and to help prevent falls. What can I do on the outside of my home? Regularly fix the edges of walkways and driveways and fix any cracks. Remove anything that might make you trip as you walk through a door, such as a raised step or threshold. Trim any bushes or trees on the path to your home. Use bright outdoor lighting. Clear any walking paths of anything that might make someone trip, such as rocks or tools. Regularly check to see if handrails are loose or broken. Make sure that both sides of any steps have handrails. Any raised decks and porches should have guardrails on the edges. Have any leaves, snow, or ice cleared regularly. Use sand or salt on walking paths during winter. Clean up any spills in your garage right away. This includes oil or grease spills. What can I do in the bathroom? Use night lights. Install grab bars by the toilet and in the tub and shower. Do not use towel bars as grab bars. Use non-skid mats or decals in the tub or shower. If you need to sit down in  the shower, use a plastic, non-slip stool. Keep the floor dry. Clean up any water that spills on the floor as soon as it happens. Remove soap buildup in the tub or shower regularly. Attach bath mats securely with double-sided non-slip rug tape. Do not have throw rugs and other things on the floor that can make you trip. What can I do in the bedroom? Use night lights. Make sure that you have a light by your bed that is easy to reach. Do not use any sheets or blankets that are too big for your bed. They should not hang down onto the floor. Have a firm chair that has side arms. You can use this for support while you get dressed. Do not have throw rugs and other things on the floor that can make you trip. What can I do in the kitchen? Clean up any spills right away. Avoid walking on wet floors. Keep items that you use a lot in easy-to-reach places. If you need to reach something above you, use a strong step stool that has a grab bar. Keep electrical cords out of the way. Do not use floor polish or wax that makes floors slippery. If you must use wax, use non-skid floor wax. Do not have throw rugs and other things on the floor that can make you trip. What can I do with my stairs? Do not leave any items on the stairs. Make sure that there are  handrails on both sides of the stairs and use them. Fix handrails that are broken or loose. Make sure that handrails are as long as the stairways. Check any carpeting to make sure that it is firmly attached to the stairs. Fix any carpet that is loose or worn. Avoid having throw rugs at the top or bottom of the stairs. If you do have throw rugs, attach them to the floor with carpet tape. Make sure that you have a light switch at the top of the stairs and the bottom of the stairs. If you do not have them, ask someone to add them for you. What else can I do to help prevent falls? Wear shoes that: Do not have high heels. Have rubber bottoms. Are comfortable  and fit you well. Are closed at the toe. Do not wear sandals. If you use a stepladder: Make sure that it is fully opened. Do not climb a closed stepladder. Make sure that both sides of the stepladder are locked into place. Ask someone to hold it for you, if possible. Clearly mark and make sure that you can see: Any grab bars or handrails. First and last steps. Where the edge of each step is. Use tools that help you move around (mobility aids) if they are needed. These include: Canes. Walkers. Scooters. Crutches. Turn on the lights when you go into a dark area. Replace any light bulbs as soon as they burn out. Set up your furniture so you have a clear path. Avoid moving your furniture around. If any of your floors are uneven, fix them. If there are any pets around you, be aware of where they are. Review your medicines with your doctor. Some medicines can make you feel dizzy. This can increase your chance of falling. Ask your doctor what other things that you can do to help prevent falls. This information is not intended to replace advice given to you by your health care provider. Make sure you discuss any questions you have with your health care provider. Document Released: 11/17/2008 Document Revised: 06/29/2015 Document Reviewed: 02/25/2014 Elsevier Interactive Patient Education  2017 [employer].

## 2020-12-18 ENCOUNTER — Other Ambulatory Visit: Payer: Self-pay | Admitting: Family Medicine

## 2021-01-31 ENCOUNTER — Other Ambulatory Visit: Payer: Self-pay | Admitting: Family Medicine

## 2021-02-05 ENCOUNTER — Other Ambulatory Visit: Payer: Self-pay | Admitting: Family Medicine

## 2021-02-26 ENCOUNTER — Other Ambulatory Visit: Payer: Self-pay | Admitting: Family Medicine

## 2021-02-26 NOTE — Telephone Encounter (Signed)
Clonazepam refill request.  Last seen 08/21/2020, last filled 08/21/2020.

## 2021-03-14 DIAGNOSIS — H90A21 Sensorineural hearing loss, unilateral, right ear, with restricted hearing on the contralateral side: Secondary | ICD-10-CM | POA: Diagnosis not present

## 2021-03-14 DIAGNOSIS — H6123 Impacted cerumen, bilateral: Secondary | ICD-10-CM | POA: Diagnosis not present

## 2021-03-30 ENCOUNTER — Other Ambulatory Visit: Payer: Self-pay | Admitting: Family Medicine

## 2021-03-30 ENCOUNTER — Other Ambulatory Visit: Payer: Self-pay

## 2021-04-16 ENCOUNTER — Other Ambulatory Visit: Payer: Self-pay | Admitting: Family Medicine

## 2021-05-09 NOTE — Addendum Note (Signed)
Addended by: Colman Cater on: 05/09/2021 05:19 PM ? ? Modules accepted: Orders ? ?

## 2021-05-09 NOTE — Addendum Note (Signed)
Addended by: Arta Silence on: 05/09/2021 05:23 PM ? ? Modules accepted: Orders ? ?

## 2021-05-10 ENCOUNTER — Other Ambulatory Visit: Payer: Self-pay

## 2021-05-10 ENCOUNTER — Other Ambulatory Visit: Payer: PPO

## 2021-05-10 ENCOUNTER — Ambulatory Visit (INDEPENDENT_AMBULATORY_CARE_PROVIDER_SITE_OTHER): Payer: PPO

## 2021-05-10 DIAGNOSIS — M810 Age-related osteoporosis without current pathological fracture: Secondary | ICD-10-CM

## 2021-05-10 DIAGNOSIS — I10 Essential (primary) hypertension: Secondary | ICD-10-CM | POA: Diagnosis not present

## 2021-05-10 NOTE — Addendum Note (Signed)
Addended by: Shelby Dubin on: 05/10/2021 08:13 AM ? ? Modules accepted: Orders ? ?

## 2021-05-11 LAB — CBC WITH DIFFERENTIAL/PLATELET
Absolute Monocytes: 737 cells/uL (ref 200–950)
Basophils Absolute: 52 cells/uL (ref 0–200)
Basophils Relative: 0.9 %
Eosinophils Absolute: 151 cells/uL (ref 15–500)
Eosinophils Relative: 2.6 %
HCT: 35.9 % (ref 35.0–45.0)
Hemoglobin: 11.7 g/dL (ref 11.7–15.5)
Lymphs Abs: 2123 cells/uL (ref 850–3900)
MCH: 29.5 pg (ref 27.0–33.0)
MCHC: 32.6 g/dL (ref 32.0–36.0)
MCV: 90.4 fL (ref 80.0–100.0)
MPV: 11.4 fL (ref 7.5–12.5)
Monocytes Relative: 12.7 %
Neutro Abs: 2738 cells/uL (ref 1500–7800)
Neutrophils Relative %: 47.2 %
Platelets: 169 10*3/uL (ref 140–400)
RBC: 3.97 10*6/uL (ref 3.80–5.10)
RDW: 12.8 % (ref 11.0–15.0)
Total Lymphocyte: 36.6 %
WBC: 5.8 10*3/uL (ref 3.8–10.8)

## 2021-05-11 LAB — COMPREHENSIVE METABOLIC PANEL
AG Ratio: 1.7 (calc) (ref 1.0–2.5)
ALT: 14 U/L (ref 6–29)
AST: 14 U/L (ref 10–35)
Albumin: 4.1 g/dL (ref 3.6–5.1)
Alkaline phosphatase (APISO): 40 U/L (ref 37–153)
BUN: 17 mg/dL (ref 7–25)
CO2: 33 mmol/L — ABNORMAL HIGH (ref 20–32)
Calcium: 10.3 mg/dL (ref 8.6–10.4)
Chloride: 101 mmol/L (ref 98–110)
Creat: 0.82 mg/dL (ref 0.60–0.95)
Globulin: 2.4 g/dL (calc) (ref 1.9–3.7)
Glucose, Bld: 103 mg/dL — ABNORMAL HIGH (ref 65–99)
Potassium: 4.6 mmol/L (ref 3.5–5.3)
Sodium: 139 mmol/L (ref 135–146)
Total Bilirubin: 0.7 mg/dL (ref 0.2–1.2)
Total Protein: 6.5 g/dL (ref 6.1–8.1)

## 2021-05-11 LAB — LIPID PANEL
Cholesterol: 141 mg/dL (ref <200)
HDL: 72 mg/dL (ref >=50)
LDL Cholesterol (Calc): 54 mg/dL (calc)
Non-HDL Cholesterol (Calc): 69 mg/dL (calc) (ref <130)
Total CHOL/HDL Ratio: 2 (calc) (ref <5.0)
Triglycerides: 71 mg/dL (ref <150)

## 2021-05-15 ENCOUNTER — Ambulatory Visit (INDEPENDENT_AMBULATORY_CARE_PROVIDER_SITE_OTHER): Payer: PPO | Admitting: Family Medicine

## 2021-05-15 VITALS — BP 142/60 | HR 78 | Temp 96.7°F | Ht 61.0 in | Wt 115.4 lb

## 2021-05-15 DIAGNOSIS — E782 Mixed hyperlipidemia: Secondary | ICD-10-CM | POA: Diagnosis not present

## 2021-05-15 DIAGNOSIS — R0989 Other specified symptoms and signs involving the circulatory and respiratory systems: Secondary | ICD-10-CM | POA: Diagnosis not present

## 2021-05-15 DIAGNOSIS — Z0000 Encounter for general adult medical examination without abnormal findings: Secondary | ICD-10-CM

## 2021-05-15 DIAGNOSIS — Z1231 Encounter for screening mammogram for malignant neoplasm of breast: Secondary | ICD-10-CM | POA: Diagnosis not present

## 2021-05-15 DIAGNOSIS — M810 Age-related osteoporosis without current pathological fracture: Secondary | ICD-10-CM | POA: Diagnosis not present

## 2021-05-15 DIAGNOSIS — I10 Essential (primary) hypertension: Secondary | ICD-10-CM

## 2021-05-15 DIAGNOSIS — F411 Generalized anxiety disorder: Secondary | ICD-10-CM | POA: Diagnosis not present

## 2021-05-15 MED ORDER — DENOSUMAB 60 MG/ML ~~LOC~~ SOSY
60.0000 mg | PREFILLED_SYRINGE | Freq: Once | SUBCUTANEOUS | Status: AC
  Administered 2021-05-10: 60 mg via SUBCUTANEOUS

## 2021-05-15 NOTE — Progress Notes (Signed)
? ?Subjective:  ? ? Patient ID: Brittany Levy, female    DOB: 1934/10/20, 87 y.o.   MRN: [id number] ? ?HPI ? ?Patient is a very sweet 87 year old Caucasian female who is here today for complete physical exam.  She denies any falls.  She denies any depression.  She does report some trouble remembering names.  She denies losing money.  She denies trouble balancing her checkbook.  She denies getting lost while driving.  She denies forgetting conversations or short-term memory problems.  Essentially she occasionally forgets people's names.  I reassured her that that does not have any concerns yet but there is an underlying issue.  She is still battling neuropathy in her feet but she has chosen not to take gabapentin.  Mini-Mental status exam today is 30 out of 30.  On her physical exam she does have a right-sided carotid bruit.  Blood pressure is acceptable and she has very difficult to control blood pressure.  She is overdue for mammogram.  She is due for Shingrix.  She is also due for the COVID booster. ?Mammogram-2020 ?Colonoscopy 2010 (not due over 80) ?DEXA-2022-osteopenia ?PAP- not due (over 65) ? ?Immunization History  ?Administered Date(s) Administered  ? Fluad Quad(high Dose 65+) 10/08/2018  ? Influenza Split 10/18/2010  ? Influenza,inj,Quad PF,6+ Mos 10/08/2012, 10/06/2013, 10/09/2016, 11/14/2017  ? Influenza-Unspecified 03/09/2015, 12/07/2015, 11/14/2020  ? PFIZER(Purple Top)SARS-COV-2 Vaccination 02/21/2019, 03/11/2019  ? Pneumococcal Conjugate-13 02/12/2013  ? Pneumococcal Polysaccharide-23 02/05/1999, 02/15/2014  ? Zoster, Live 09/18/2011  ? ?Orders Only on 05/10/2021  ?Component Date Value Ref Range Status  ? Glucose, Bld 05/10/2021 103 (H)  65 - 99 mg/dL Final  ? Comment: . ?           Fasting reference interval ?. ?For someone without known diabetes, a glucose value ?between 100 and 125 mg/dL is consistent with ?prediabetes and should be confirmed with a ?follow-up test. ?. ?  ? BUN 05/10/2021 17  7 -  25 mg/dL Final  ? Creat 86/57/8469 0.82  0.60 - 0.95 mg/dL Final  ? BUN/Creatinine Ratio 05/10/2021 NOT APPLICABLE  6 - 22 (calc) Final  ? Sodium 05/10/2021 139  135 - 146 mmol/L Final  ? Potassium 05/10/2021 4.6  3.5 - 5.3 mmol/L Final  ? Chloride 05/10/2021 101  98 - 110 mmol/L Final  ? CO2 05/10/2021 33 (H)  20 - 32 mmol/L Final  ? Calcium 05/10/2021 10.3  8.6 - 10.4 mg/dL Final  ? Total Protein 05/10/2021 6.5  6.1 - 8.1 g/dL Final  ? Albumin 62/95/2841 4.1  3.6 - 5.1 g/dL Final  ? Globulin 32/44/0102 2.4  1.9 - 3.7 g/dL (calc) Final  ? AG Ratio 05/10/2021 1.7  1.0 - 2.5 (calc) Final  ? Total Bilirubin 05/10/2021 0.7  0.2 - 1.2 mg/dL Final  ? Alkaline phosphatase (APISO) 05/10/2021 40  37 - 153 U/L Final  ? AST 05/10/2021 14  10 - 35 U/L Final  ? ALT 05/10/2021 14  6 - 29 U/L Final  ? WBC 05/10/2021 5.8  3.8 - 10.8 Thousand/uL Final  ? RBC 05/10/2021 3.97  3.80 - 5.10 Million/uL Final  ? Hemoglobin 05/10/2021 11.7  11.7 - 15.5 g/dL Final  ? HCT 72/53/6644 35.9  35.0 - 45.0 % Final  ? MCV 05/10/2021 90.4  80.0 - 100.0 fL Final  ? MCH 05/10/2021 29.5  27.0 - 33.0 pg Final  ? MCHC 05/10/2021 32.6  32.0 - 36.0 g/dL Final  ? RDW 03/47/4259 12.8  11.0 - 15.0 %  Final  ? Platelets 05/10/2021 169  140 - 400 Thousand/uL Final  ? MPV 05/10/2021 11.4  7.5 - 12.5 fL Final  ? Neutro Abs 05/10/2021 2,738  1,500 - 7,800 cells/uL Final  ? Lymphs Abs 05/10/2021 2,123  850 - 3,900 cells/uL Final  ? Absolute Monocytes 05/10/2021 737  200 - 950 cells/uL Final  ? Eosinophils Absolute 05/10/2021 151  15 - 500 cells/uL Final  ? Basophils Absolute 05/10/2021 52  0 - 200 cells/uL Final  ? Neutrophils Relative % 05/10/2021 47.2  % Final  ? Total Lymphocyte 05/10/2021 36.6  % Final  ? Monocytes Relative 05/10/2021 12.7  % Final  ? Eosinophils Relative 05/10/2021 2.6  % Final  ? Basophils Relative 05/10/2021 0.9  % Final  ? Cholesterol 05/10/2021 141  <200 mg/dL Final  ? HDL 85/03/7739 72  > OR = 50 mg/dL Final  ? Triglycerides 05/10/2021 71   <150 mg/dL Final  ? LDL Cholesterol (Calc) 05/10/2021 54  mg/dL (calc) Final  ? Comment: Reference range: <100 ?Marland Kitchen ?Desirable range <100 mg/dL for primary prevention;   ?<70 mg/dL for patients with CHD or diabetic patients  ?with > or = 2 CHD risk factors. ?. ?LDL-C is now calculated using the Martin-Hopkins  ?calculation, which is a validated novel method providing  ?better accuracy than the Friedewald equation in the  ?estimation of LDL-C.  ?Horald Pollen et al. Lenox Ahr. 2878;676(72): 2061-2068  ?(http://education.[facility].com/faq/FAQ164) ?  ? Total CHOL/HDL Ratio 05/10/2021 2.0  <0.9 (calc) Final  ? Non-HDL Cholesterol (Calc) 05/10/2021 69  <130 mg/dL (calc) Final  ? Comment: For patients with diabetes plus 1 major ASCVD risk  ?factor, treating to a non-HDL-C goal of <100 mg/dL  ?(LDL-C of <70 mg/dL) is considered a therapeutic  ?option. ?  ? ? ?Past Medical History:  ?Diagnosis Date  ? Chest pain   ? GERD (gastroesophageal reflux disease)   ? Hypertension   ? Insomnia   ? Osteoporosis of femur without pathological fracture   ? ?Past Surgical History:  ?Procedure Laterality Date  ? ABDOMINAL HYSTERECTOMY    ? BREAST BIOPSY Right 05/07/2016  ? FRAGMENTS OF LYMPH NODE  ? ?Current Outpatient Medications on File Prior to Visit  ?Medication Sig Dispense Refill  ? aspirin 81 MG tablet Take 81 mg by mouth daily.    ? atorvastatin (LIPITOR) 20 MG tablet TAKE 1 TABLET BY MOUTH ONCE A DAY 90 tablet 1  ? calcium-vitamin D (OSCAL WITH D) 250-125 MG-UNIT per tablet Take 1 tablet by mouth 2 (two) times daily.    ? carvedilol (COREG) 6.25 MG tablet TAKE 1 TABLET BY MOUTH TWICE A DAY WITH A MEAL 180 tablet 2  ? clonazePAM (KLONOPIN) 0.5 MG tablet TAKE 1 TABLET BY MOUTH TWICE A DAY AS NEEDED FOR ANXIETY 60 tablet 0  ? cloNIDine (CATAPRES) 0.2 MG tablet TAKE 1 TABLET BY MOUTH 3 TIMES DAILY. STOP ATENOLOL AND CLONIDINE PATCH. 90 tablet 2  ? felodipine (PLENDIL) 5 MG 24 hr tablet TAKE 1 TABLET BY MOUTH ONCE DAILY 90 tablet 1  ?  gabapentin (NEURONTIN) 100 MG capsule Take 1 capsule (100 mg total) by mouth 3 (three) times daily as needed. 90 capsule 3  ? hydrALAZINE (APRESOLINE) 25 MG tablet TAKE TWO TABLETS BY MOUTH TWICE A DAY 360 tablet 1  ? hydrochlorothiazide (HYDRODIURIL) 25 MG tablet Take 1 tablet (25 mg total) by mouth daily. 90 tablet 3  ? irbesartan (AVAPRO) 300 MG tablet TAKE 1 TABLET BY MOUTH ONCE A DAY 90  tablet 3  ? Omega-3 Fatty Acids (FISH OIL PO) Take 2 capsules by mouth.     ? omeprazole (PRILOSEC) 40 MG capsule TAKE 1 CAPSULE BY MOUTH ONCE DAILY 90 capsule 3  ? potassium chloride (KLOR-CON) 10 MEQ tablet Take 1 tablet (10 mEq total) by mouth daily. 90 tablet 3  ? ?Current Facility-Administered Medications on File Prior to Visit  ?Medication Dose Route Frequency Provider Last Rate Last Admin  ? denosumab (PROLIA) injection 60 mg  60 mg Subcutaneous Q6 months Donita BrooksPickard, Norita Meigs T, MD   60 mg at 09/29/20 [id number]  ? ?No Known Allergies ? ?Social History  ? ?Socioeconomic History  ? Marital status: Widowed  ?  Spouse name: Not on file  ? Number of children: 1  ? Years of education: Not on file  ? Highest education level: Not on file  ?Occupational History  ? Not on file  ?Tobacco Use  ? Smoking status: Never  ? Smokeless tobacco: Never  ?Substance and Sexual Activity  ? Alcohol use: Yes  ?  Comment: wine occasionally  ? Drug use: No  ? Sexual activity: Not on file  ?Other Topics Concern  ? Not on file  ?Social History Narrative  ? Husband passed in 2021.  ? 1 daughter.  ? 2 grand children.  ? ?Social Determinants of Health  ? ?Financial Resource Strain: Low Risk   ? Difficulty of Paying Living Expenses: Not hard at all  ?Food Insecurity: No Food Insecurity  ? Worried About Programme researcher, broadcasting/film/videounning Out of Food in the Last Year: Never true  ? Ran Out of Food in the Last Year: Never true  ?Transportation Needs: No Transportation Needs  ? Lack of Transportation (Medical): No  ? Lack of Transportation (Non-Medical): No  ?Physical Activity: Insufficiently  Active  ? Days of Exercise per Week: 3 days  ? Minutes of Exercise per Session: 10 min  ?Stress: No Stress Concern Present  ? Feeling of Stress : Only a little  ?Social Connections: Moderately Integrate

## 2021-05-15 NOTE — Progress Notes (Signed)
After obtaining consent, and per orders of Dr. Tanya Nones, injection of Prolia given by Arta Silence. Patient instructed to remain in clinic for 20 minutes afterwards, and to report any adverse reaction to me immediately.

## 2021-05-21 ENCOUNTER — Other Ambulatory Visit: Payer: Self-pay | Admitting: Family Medicine

## 2021-06-15 ENCOUNTER — Encounter: Payer: Self-pay | Admitting: Family Medicine

## 2021-06-20 ENCOUNTER — Ambulatory Visit
Admission: RE | Admit: 2021-06-20 | Discharge: 2021-06-20 | Disposition: A | Discharge status: Home / Self Care | Payer: PPO | Source: Ambulatory Visit | Attending: Family Medicine | Admitting: Family Medicine

## 2021-06-20 DIAGNOSIS — I6523 Occlusion and stenosis of bilateral carotid arteries: Secondary | ICD-10-CM | POA: Diagnosis not present

## 2021-06-20 DIAGNOSIS — R0989 Other specified symptoms and signs involving the circulatory and respiratory systems: Secondary | ICD-10-CM | POA: Diagnosis not present

## 2021-06-21 ENCOUNTER — Encounter: Payer: Self-pay | Admitting: Family Medicine

## 2021-06-21 DIAGNOSIS — I6523 Occlusion and stenosis of bilateral carotid arteries: Secondary | ICD-10-CM | POA: Insufficient documentation

## 2021-06-22 ENCOUNTER — Other Ambulatory Visit: Payer: Self-pay

## 2021-06-22 MED ORDER — ATORVASTATIN CALCIUM 40 MG PO TABS: 40.0000 mg | ORAL_TABLET | Freq: Every day | ORAL | 1 refills | Status: DC

## 2021-07-12 ENCOUNTER — Other Ambulatory Visit: Payer: Self-pay | Admitting: Family Medicine

## 2021-07-17 ENCOUNTER — Ambulatory Visit
Admission: RE | Admit: 2021-07-17 | Discharge: 2021-07-17 | Disposition: A | Discharge status: Home / Self Care | Payer: PPO | Source: Ambulatory Visit | Attending: Family Medicine | Admitting: Family Medicine

## 2021-07-17 DIAGNOSIS — Z1231 Encounter for screening mammogram for malignant neoplasm of breast: Secondary | ICD-10-CM | POA: Insufficient documentation

## 2021-08-15 ENCOUNTER — Other Ambulatory Visit: Payer: Self-pay | Admitting: Family Medicine

## 2021-09-04 ENCOUNTER — Ambulatory Visit (INDEPENDENT_AMBULATORY_CARE_PROVIDER_SITE_OTHER): Payer: PPO | Admitting: Family Medicine

## 2021-09-04 VITALS — BP 130/60 | HR 77 | Temp 97.8°F | Ht 61.0 in | Wt 115.0 lb

## 2021-09-04 DIAGNOSIS — R0602 Shortness of breath: Secondary | ICD-10-CM

## 2021-09-04 DIAGNOSIS — R9431 Abnormal electrocardiogram [ECG] [EKG]: Secondary | ICD-10-CM | POA: Diagnosis not present

## 2021-09-04 NOTE — Progress Notes (Signed)
Subjective:    Patient ID: Brittany Levy, female    DOB: 25-Oct-1934, 87 y.o.   MRN: [id number]  HPI Patient is a very pleasant 87 year old Caucasian female with a longstanding history of hypertension who presents today reporting that she does not feel well.  She states that when she called to make the appointment, she felt like "she was going to die".  She is very tearful when she discusses this.  She states that ever since May she has been under tremendous stress.  She is a widow.  She has recently been having problems with her roof and her well at her home and she has been having a difficult time getting those items repaired and having to coordinate this on her own.  Therefore she is under a lot of stress and very anxious.  Recently this culminated with an episode where she felt short of breath, she states that her legs were swelling, she felt like her face was swelling, she felt slightly short of breath.  There was no exertional component to it.  She has a very difficult time putting into words exactly how it felt.  She states that her legs were very swollen.  She reports a vague tightness in her chest.  This was unrelated to activity.  The event gradually improved.  The duration of time that it lasted was very vague and uncertain.  Today she states that she feels much better.  There is no swelling in her legs.  She denies any discomfort in her chest.  She denies any shortness of breath today although she did add that she has been having more shortness of breath this summer, "due to the heat." Past Medical History:  Diagnosis Date   Bilateral carotid artery stenosis    Chest pain    GERD (gastroesophageal reflux disease)    Hypertension    Insomnia    Osteoporosis of femur without pathological fracture    Past Surgical History:  Procedure Laterality Date   ABDOMINAL HYSTERECTOMY     BREAST BIOPSY Right 05/07/2016   FRAGMENTS OF LYMPH NODE   Current Outpatient Medications on File Prior to  Visit  Medication Sig Dispense Refill   atorvastatin (LIPITOR) 40 MG tablet Take 1 tablet (40 mg total) by mouth daily. 90 tablet 1   aspirin 81 MG tablet Take 81 mg by mouth daily.     calcium-vitamin D (OSCAL WITH D) 250-125 MG-UNIT per tablet Take 1 tablet by mouth 2 (two) times daily.     carvedilol (COREG) 6.25 MG tablet TAKE 1 TABLET BY MOUTH TWICE A DAY WITH A MEAL 180 tablet 2   clonazePAM (KLONOPIN) 0.5 MG tablet TAKE 1 TABLET BY MOUTH TWICE A DAY AS NEEDED FOR ANXIETY 60 tablet 3   cloNIDine (CATAPRES) 0.2 MG tablet TAKE 1 TABLET BY MOUTH 3 TIMES DAILY. STOP ATENOLOL AND CLONIDINE PATCH. 90 tablet 2   felodipine (PLENDIL) 5 MG 24 hr tablet TAKE 1 TABLET BY MOUTH ONCE DAILY 90 tablet 1   gabapentin (NEURONTIN) 100 MG capsule Take 1 capsule (100 mg total) by mouth 3 (three) times daily as needed. 90 capsule 3   hydrALAZINE (APRESOLINE) 25 MG tablet TAKE TWO TABLETS BY MOUTH TWICE A DAY 360 tablet 1   hydrochlorothiazide (HYDRODIURIL) 25 MG tablet Take 1 tablet (25 mg total) by mouth daily. 90 tablet 3   irbesartan (AVAPRO) 300 MG tablet TAKE 1 TABLET BY MOUTH ONCE A DAY 90 tablet 3   Omega-3 Fatty Acids (  FISH OIL PO) Take 2 capsules by mouth.      omeprazole (PRILOSEC) 40 MG capsule TAKE 1 CAPSULE BY MOUTH ONCE DAILY 90 capsule 3   potassium chloride (KLOR-CON) 10 MEQ tablet TAKE 1 TABLET BY MOUTH ONCE A DAY 90 tablet 3   Current Facility-Administered Medications on File Prior to Visit  Medication Dose Route Frequency Provider Last Rate Last Admin   denosumab (PROLIA) injection 60 mg  60 mg Subcutaneous Q6 months Donita Brooks, MD   60 mg at 09/29/20 1950   No Known Allergies  Social History   Socioeconomic History   Marital status: Widowed    Spouse name: Not on file   Number of children: 1   Years of education: Not on file   Highest education level: Not on file  Occupational History   Not on file  Tobacco Use   Smoking status: Never   Smokeless tobacco: Never   Substance and Sexual Activity   Alcohol use: Yes    Comment: wine occasionally   Drug use: No   Sexual activity: Not on file  Other Topics Concern   Not on file  Social History Narrative   Husband passed in 2021.   1 daughter.   2 grand children.   Social Determinants of Health   Financial Resource Strain: Low Risk  (12/15/2020)   Overall Financial Resource Strain (CARDIA)    Difficulty of Paying Living Expenses: Not hard at all  Food Insecurity: No Food Insecurity (12/15/2020)   Hunger Vital Sign    Worried About Running Out of Food in the Last Year: Never true    Ran Out of Food in the Last Year: Never true  Transportation Needs: No Transportation Needs (12/15/2020)   PRAPARE - Administrator, Civil Service (Medical): No    Lack of Transportation (Non-Medical): No  Physical Activity: Insufficiently Active (12/15/2020)   Exercise Vital Sign    Days of Exercise per Week: 3 days    Minutes of Exercise per Session: 10 min  Stress: No Stress Concern Present (12/15/2020)   [employer] of Occupational Health - Occupational Stress Questionnaire    Feeling of Stress : Only a little  Social Connections: Moderately Integrated (12/15/2020)   Social Connection and Isolation Panel [NHANES]    Frequency of Communication with Friends and Family: More than three times a week    Frequency of Social Gatherings with Friends and Family: More than three times a week    Attends Religious Services: More than 4 times per year    Active Member of [employer] or Organizations: Yes    Attends Banker Meetings: More than 4 times per year    Marital Status: Widowed  Intimate Partner Violence: Not At Risk (12/15/2020)   Humiliation, Afraid, Rape, and Kick questionnaire    Fear of Current or Ex-Partner: No    Emotionally Abused: No    Physically Abused: No    Sexually Abused: No     Review of Systems     Objective:   Physical Exam Constitutional:      General: She  is not in acute distress.    Appearance: Normal appearance. She is normal weight. She is not ill-appearing or toxic-appearing.  Cardiovascular:     Rate and Rhythm: Normal rate and regular rhythm.     Pulses: Normal pulses.     Heart sounds: Normal heart sounds. No murmur heard.    No friction rub.  Pulmonary:  Effort: Pulmonary effort is normal. No respiratory distress.     Breath sounds: Normal breath sounds. No stridor. No wheezing, rhonchi or rales.  Musculoskeletal:        General: No swelling, tenderness, deformity or signs of injury.     Right lower leg: Normal. No swelling, deformity, tenderness or bony tenderness. No edema.     Left lower leg: Normal. No swelling, deformity, tenderness or bony tenderness. No edema.  Neurological:     General: No focal deficit present.     Mental Status: She is alert and oriented to person, place, and time.     Cranial Nerves: No cranial nerve deficit.     Sensory: No sensory deficit.     Motor: No weakness.     Coordination: Coordination normal.     Gait: Gait normal.     Deep Tendon Reflexes: Reflexes normal.          Assessment & Plan:  Shortness of breath - Plan: EKG 12-Lead, CBC with Differential/Platelet, Brain natriuretic peptide, COMPLETE METABOLIC PANEL WITH GFR EKG today shows normal sinus rhythm however there is a right bundle branch block and a left bifascicular block.  I do not have a previous EKG to compare to but this appears to be a new finding.  There is no obvious signs of ischemia or infarction.  However given the EKG changes, I have recommended that we proceed with an echocardiogram given her symptoms.  Certainly some of her symptoms could have been anxiety or stress however I am concerned with the shortness of breath and the leg swelling and the EKG changes about possible underlying cardiac issues.  Begin with an echocardiogram, CBC, BNP, cmp.

## 2021-09-05 LAB — CBC WITH DIFFERENTIAL/PLATELET
Absolute Monocytes: 670 cells/uL (ref 200–950)
Basophils Absolute: 47 cells/uL (ref 0–200)
Basophils Relative: 0.7 %
Eosinophils Absolute: 168 cells/uL (ref 15–500)
Eosinophils Relative: 2.5 %
HCT: 37.2 % (ref 35.0–45.0)
Hemoglobin: 12.4 g/dL (ref 11.7–15.5)
Lymphs Abs: 2660 cells/uL (ref 850–3900)
MCH: 29 pg (ref 27.0–33.0)
MCHC: 33.3 g/dL (ref 32.0–36.0)
MCV: 87.1 fL (ref 80.0–100.0)
MPV: 10.7 fL (ref 7.5–12.5)
Monocytes Relative: 10 %
Neutro Abs: 3156 cells/uL (ref 1500–7800)
Neutrophils Relative %: 47.1 %
Platelets: 201 10*3/uL (ref 140–400)
RBC: 4.27 10*6/uL (ref 3.80–5.10)
RDW: 13.1 % (ref 11.0–15.0)
Total Lymphocyte: 39.7 %
WBC: 6.7 10*3/uL (ref 3.8–10.8)

## 2021-09-05 LAB — COMPLETE METABOLIC PANEL WITH GFR
AG Ratio: 1.6 (calc) (ref 1.0–2.5)
ALT: 22 U/L (ref 6–29)
AST: 19 U/L (ref 10–35)
Albumin: 4.4 g/dL (ref 3.6–5.1)
Alkaline phosphatase (APISO): 46 U/L (ref 37–153)
BUN: 11 mg/dL (ref 7–25)
CO2: 31 mmol/L (ref 20–32)
Calcium: 10.3 mg/dL (ref 8.6–10.4)
Chloride: 96 mmol/L — ABNORMAL LOW (ref 98–110)
Creat: 0.7 mg/dL (ref 0.60–0.95)
Globulin: 2.7 g/dL (calc) (ref 1.9–3.7)
Glucose, Bld: 92 mg/dL (ref 65–99)
Potassium: 4.2 mmol/L (ref 3.5–5.3)
Sodium: 136 mmol/L (ref 135–146)
Total Bilirubin: 1 mg/dL (ref 0.2–1.2)
Total Protein: 7.1 g/dL (ref 6.1–8.1)
eGFR: 85 mL/min/{1.73_m2} (ref >=60)

## 2021-09-05 LAB — BRAIN NATRIURETIC PEPTIDE: Brain Natriuretic Peptide: 18 pg/mL (ref <100)

## 2021-09-06 ENCOUNTER — Other Ambulatory Visit: Payer: Self-pay | Admitting: Family Medicine

## 2021-09-07 NOTE — Telephone Encounter (Signed)
Requested Prescriptions  Pending Prescriptions Disp Refills  . cloNIDine (CATAPRES) 0.2 MG tablet [Pharmacy Med Name: CLONIDINE HCL 0.2 MG TAB] 90 tablet 0    Sig: TAKE 1 TABLET BY MOUTH 3 TIMES DAILY. STOP ATENOLOL AND CLONIDINE PATCH.     Cardiovascular:  Alpha-2 Agonists Failed - 09/06/2021 10:20 AM      Failed - Valid encounter within last 6 months    Recent Outpatient Visits          3 months ago GAD (generalized anxiety disorder)   [facility] Pickard, Priscille Heidelberg, MD   1 year ago Neuropathy   [facility] Pickard, Priscille Heidelberg, MD   1 year ago Essential hypertension   [facility] Tanya Nones, Priscille Heidelberg, MD   1 year ago Tick bite, initial encounter   [facility] Tanya Nones Priscille Heidelberg, MD   2 years ago Rhinosinusitis   [facility] Tanya Nones, Priscille Heidelberg, MD             Passed - Last BP in normal range    BP Readings from Last 1 Encounters:  09/04/21 130/60         Passed - Last Heart Rate in normal range    Pulse Readings from Last 1 Encounters:  09/04/21 77

## 2021-09-12 DIAGNOSIS — H903 Sensorineural hearing loss, bilateral: Secondary | ICD-10-CM | POA: Diagnosis not present

## 2021-09-12 DIAGNOSIS — H6123 Impacted cerumen, bilateral: Secondary | ICD-10-CM | POA: Diagnosis not present

## 2021-09-17 ENCOUNTER — Telehealth: Payer: Self-pay | Admitting: Pharmacist

## 2021-09-17 NOTE — Progress Notes (Signed)
Chronic Care Management Pharmacy Assistant   Name: EKATERINI CAPITANO  MRN: [id number] DOB: [birth year]   Reason for Encounter: Disease State - Hypertension Call     Recent office visits:  09/04/21 Lynnea Ferrier, MD - Family Medicine - Shortness of breath - Labs were ordered. ECHO ordered and EKG performed. Follow up as scheduled.   05/15/21 Lynnea Ferrier, MD - Family Medicine - Anxiety - Mammogram and Carotid US ordered. Recommended 1200 mg a day of calcium and 1000 units a day of vitamin D. Follow up as scheduled.    Recent consult visits:  None noted.    Hospital visits:  None in previous 6 months  Medications: Outpatient Encounter Medications as of 09/17/2021  Medication Sig   atorvastatin (LIPITOR) 40 MG tablet Take 1 tablet (40 mg total) by mouth daily.   aspirin 81 MG tablet Take 81 mg by mouth daily.   calcium-vitamin D (OSCAL WITH D) 250-125 MG-UNIT per tablet Take 1 tablet by mouth 2 (two) times daily.   carvedilol (COREG) 6.25 MG tablet TAKE 1 TABLET BY MOUTH TWICE A DAY WITH A MEAL   clonazePAM (KLONOPIN) 0.5 MG tablet TAKE 1 TABLET BY MOUTH TWICE A DAY AS NEEDED FOR ANXIETY   cloNIDine (CATAPRES) 0.2 MG tablet TAKE 1 TABLET BY MOUTH 3 TIMES DAILY. STOP ATENOLOL AND CLONIDINE PATCH.   felodipine (PLENDIL) 5 MG 24 hr tablet TAKE 1 TABLET BY MOUTH ONCE DAILY   gabapentin (NEURONTIN) 100 MG capsule Take 1 capsule (100 mg total) by mouth 3 (three) times daily as needed.   hydrALAZINE (APRESOLINE) 25 MG tablet TAKE TWO TABLETS BY MOUTH TWICE A DAY   hydrochlorothiazide (HYDRODIURIL) 25 MG tablet Take 1 tablet (25 mg total) by mouth daily.   irbesartan (AVAPRO) 300 MG tablet TAKE 1 TABLET BY MOUTH ONCE A DAY   Omega-3 Fatty Acids (FISH OIL PO) Take 2 capsules by mouth.    omeprazole (PRILOSEC) 40 MG capsule TAKE 1 CAPSULE BY MOUTH ONCE DAILY   potassium chloride (KLOR-CON) 10 MEQ tablet TAKE 1 TABLET BY MOUTH ONCE A DAY   Facility-Administered Encounter Medications as of  09/17/2021  Medication   denosumab (PROLIA) injection 60 mg    Current antihypertensive regimen:  carvedilol (COREG) 6.25 MG tablet cloNIDine (CATAPRES) 0.2 MG tablet irbesartan (AVAPRO) 300 MG tablet  How often are you checking your Blood Pressure?   Current home BP readings:     What recent interventions/DTPs have been made by any provider to improve Blood Pressure control since last CPP Visit:     Any recent hospitalizations or ED visits since last visit with CPP? Patient has not had any hospitalizations or ED visits since last visit with CPP   What diet changes have been made to improve Blood Pressure Control?     What exercise is being done to improve your Blood Pressure Control?       Adherence Review: Is the patient currently on ACE/ARB medication? Yes Does the patient have >5 day gap between last estimated fill dates? No     Care Gaps  AWV: done 12/15/20 Colonoscopy: done 02/05/08 DM Eye Exam: N/A DM Foot Exam: N/A Microalbumin: N/A HbgAIC: N/A DEXA: done 07/06/20 Mammogram: done 07/21/21   Star Rating Drugs: Atorvastatin (LIPITOR) 40 MG tablet - last filled 06/22/21 90 days  Irbesartan (AVAPRO) 300 MG tablet - last filled 07/04/21 90 days    Future Appointments  Date Time Provider Department Center  09/20/2021  7:15 AM [facility] E[facility]  3 [id number]   Multiple attempts were made to contact patient. Attempts were unsuccessful. / ls,CMA   Eugenie Filler, [facility] Psychiatric Clinical Pharmacist Assistant  [phone]

## 2021-09-20 ENCOUNTER — Ambulatory Visit (HOSPITAL_COMMUNITY): Payer: PPO | Attending: Cardiology

## 2021-09-20 DIAGNOSIS — R9431 Abnormal electrocardiogram [ECG] [EKG]: Secondary | ICD-10-CM | POA: Insufficient documentation

## 2021-09-20 DIAGNOSIS — R0602 Shortness of breath: Secondary | ICD-10-CM | POA: Insufficient documentation

## 2021-09-20 LAB — ECHOCARDIOGRAM COMPLETE
Area-P 1/2: 2.44 cm2
S' Lateral: 1.9 cm

## 2021-10-11 ENCOUNTER — Other Ambulatory Visit: Payer: Self-pay | Admitting: Family Medicine

## 2021-10-29 ENCOUNTER — Other Ambulatory Visit: Payer: Self-pay | Admitting: Family Medicine

## 2021-11-27 ENCOUNTER — Ambulatory Visit (INDEPENDENT_AMBULATORY_CARE_PROVIDER_SITE_OTHER): Payer: PPO | Admitting: Family Medicine

## 2021-11-27 VITALS — BP 136/62 | HR 81 | Temp 97.7°F | Ht 61.0 in | Wt 114.0 lb

## 2021-11-27 DIAGNOSIS — J069 Acute upper respiratory infection, unspecified: Secondary | ICD-10-CM

## 2021-11-27 DIAGNOSIS — H903 Sensorineural hearing loss, bilateral: Secondary | ICD-10-CM | POA: Diagnosis not present

## 2021-11-27 NOTE — Progress Notes (Signed)
Acute Office Visit  Subjective:     Patient ID: Brittany Levy, female    DOB: May 29, 1934, 87 y.o.   MRN: [id number]  Chief Complaint  Patient presents with   Follow-up    possible sinus infection started yesterday   Sinusitis    Brittany Levy is a pleasant 87 year old here today for complaints of 1 day of nasal congestion, mucus production, ear pressure, eye puffiness, cough, and sinus pressure. She denies SOB, wheezing, chest pain, palpitations, fevers, or headaches. She has not tried anything for symptom relief and reports feeling much better today.    Review of Systems  Constitutional: Negative.   HENT:  Positive for congestion and sinus pain.   Eyes:        Eye pressure  Respiratory:  Positive for cough. Negative for shortness of breath and wheezing.   Cardiovascular: Negative.   Musculoskeletal: Negative.   Neurological: Negative.     Past Medical History:  Diagnosis Date   Bilateral carotid artery stenosis    Chest pain    GERD (gastroesophageal reflux disease)    Hypertension    Insomnia    Osteoporosis of femur without pathological fracture    Past Surgical History:  Procedure Laterality Date   ABDOMINAL HYSTERECTOMY     BREAST BIOPSY Right 05/07/2016   FRAGMENTS OF LYMPH NODE   Current Outpatient Medications on File Prior to Visit  Medication Sig Dispense Refill   aspirin 81 MG tablet Take 81 mg by mouth daily.     atorvastatin (LIPITOR) 40 MG tablet Take 1 tablet (40 mg total) by mouth daily. 90 tablet 1   calcium-vitamin D (OSCAL WITH D) 250-125 MG-UNIT per tablet Take 1 tablet by mouth 2 (two) times daily.     carvedilol (COREG) 6.25 MG tablet TAKE 1 TABLET BY MOUTH TWICE A DAY WITH A MEAL 180 tablet 2   clonazePAM (KLONOPIN) 0.5 MG tablet TAKE 1 TABLET BY MOUTH TWICE A DAY AS NEEDED FOR ANXIETY 60 tablet 3   cloNIDine (CATAPRES) 0.2 MG tablet TAKE 1 TABLET BY MOUTH 3 TIMES DAILY. STOP ATENOLOL AND CLONIDINE PATCH. 90 tablet 0   felodipine (PLENDIL) 5  MG 24 hr tablet TAKE 1 TABLET BY MOUTH ONCE DAILY 90 tablet 1   gabapentin (NEURONTIN) 100 MG capsule Take 1 capsule (100 mg total) by mouth 3 (three) times daily as needed. 90 capsule 3   hydrALAZINE (APRESOLINE) 25 MG tablet TAKE TWO TABLETS BY MOUTH TWICE A DAY 360 tablet 1   hydrochlorothiazide (HYDRODIURIL) 25 MG tablet TAKE 1 TABLET BY MOUTH ONCE DAILY 90 tablet 3   irbesartan (AVAPRO) 300 MG tablet TAKE 1 TABLET BY MOUTH ONCE A DAY 90 tablet 3   Omega-3 Fatty Acids (FISH OIL PO) Take 2 capsules by mouth.      omeprazole (PRILOSEC) 40 MG capsule TAKE 1 CAPSULE BY MOUTH ONCE DAILY 90 capsule 3   potassium chloride (KLOR-CON) 10 MEQ tablet TAKE 1 TABLET BY MOUTH ONCE A DAY 90 tablet 3   Current Facility-Administered Medications on File Prior to Visit  Medication Dose Route Frequency Provider Last Rate Last Admin   denosumab (PROLIA) injection 60 mg  60 mg Subcutaneous Q6 months Lynnea Ferrier T, MD   60 mg at 09/29/20 3295   No Known Allergies     Objective:    BP 136/62   Pulse 81   Temp 97.7 F (36.5 C) (Oral)   Ht 5\' 1"  (1.549 m)   Wt 114 lb (  51.7 kg)   SpO2 98%   BMI 21.54 kg/m    Physical Exam Vitals and nursing note reviewed.  Constitutional:      Appearance: Normal appearance. She is normal weight.  HENT:     Head: Normocephalic and atraumatic.     Right Ear: Tympanic membrane, ear canal and external ear normal.     Left Ear: There is impacted cerumen.     Nose: Nose normal.     Mouth/Throat:     Mouth: Mucous membranes are dry.     Pharynx: Oropharynx is clear.  Eyes:     Conjunctiva/sclera: Conjunctivae normal.     Pupils: Pupils are equal, round, and reactive to light.  Cardiovascular:     Rate and Rhythm: Normal rate and regular rhythm.     Pulses: Normal pulses.     Heart sounds: Normal heart sounds.  Pulmonary:     Effort: Pulmonary effort is normal.     Breath sounds: Normal breath sounds.  Musculoskeletal:     Cervical back: Normal range of  motion and neck supple.  Lymphadenopathy:     Cervical: No cervical adenopathy.  Skin:    General: Skin is warm and dry.  Neurological:     General: No focal deficit present.     Mental Status: She is oriented to person, place, and time.  Psychiatric:        Mood and Affect: Mood normal.        Behavior: Behavior normal.        Thought Content: Thought content normal.        Judgment: Judgment normal.     No results found for any visits on 11/27/21.      Assessment & Plan:   1. Viral URI Reassured patient that symptoms and exam findings are most consistent with a viral upper respiratory infection and explained lack of efficacy of antibiotics against viruses.  Discussed expected course and features suggestive of secondary bacterial infection.  Continue supportive care. Increase fluid intake with water or electrolyte solution like pedialyte. Encouraged acetaminophen as needed for fever/pain. Encouraged salt water gargling, chloraseptic spray and throat lozenges. Encouraged OTC guaifenesin. Encouraged saline sinus flushes and/or neti with humidified air.      No orders of the defined types were placed in this encounter.   Return if symptoms worsen or fail to improve.  Brittany Maid, FNP

## 2021-11-30 ENCOUNTER — Ambulatory Visit (INDEPENDENT_AMBULATORY_CARE_PROVIDER_SITE_OTHER): Payer: PPO | Admitting: Family Medicine

## 2021-11-30 VITALS — BP 112/72 | HR 72 | Ht 61.0 in | Wt 113.8 lb

## 2021-11-30 DIAGNOSIS — L989 Disorder of the skin and subcutaneous tissue, unspecified: Secondary | ICD-10-CM

## 2021-11-30 MED ORDER — SILVER SULFADIAZINE 1 % EX CREA: 1.0000 | TOPICAL_CREAM | Freq: Every day | CUTANEOUS | 0 refills | Status: DC

## 2021-11-30 NOTE — Progress Notes (Signed)
Subjective:    Patient ID: Brittany Levy, female    DOB: 1934/11/20, 87 y.o.   MRN: [id number]  HPI Patient has a sore on the dorsum of her right foot.  It is in the webspace between her fourth and fifth toes.  Is approximately 8 mm in diameter.  It is a halo of hypopigmented tissue with a central area that is 5 mm in diameter of red granulation tissue.  She states that it will not heal.  She is been putting cortisone cream on it daily.  It started as a hyperkeratotic papule.  There is no erythema.  There is no fluctuance.  There is no pain.  Have a good day amount just go down the hallway into your left  Past Medical History:  Diagnosis Date   Bilateral carotid artery stenosis    Chest pain    GERD (gastroesophageal reflux disease)    Hypertension    Insomnia    Osteoporosis of femur without pathological fracture    Past Surgical History:  Procedure Laterality Date   ABDOMINAL HYSTERECTOMY     BREAST BIOPSY Right 05/07/2016   FRAGMENTS OF LYMPH NODE   Current Outpatient Medications on File Prior to Visit  Medication Sig Dispense Refill   aspirin 81 MG tablet Take 81 mg by mouth daily.     atorvastatin (LIPITOR) 40 MG tablet Take 1 tablet (40 mg total) by mouth daily. 90 tablet 1   calcium-vitamin D (OSCAL WITH D) 250-125 MG-UNIT per tablet Take 1 tablet by mouth 2 (two) times daily.     carvedilol (COREG) 6.25 MG tablet TAKE 1 TABLET BY MOUTH TWICE A DAY WITH A MEAL 180 tablet 2   clonazePAM (KLONOPIN) 0.5 MG tablet TAKE 1 TABLET BY MOUTH TWICE A DAY AS NEEDED FOR ANXIETY 60 tablet 3   cloNIDine (CATAPRES) 0.2 MG tablet TAKE 1 TABLET BY MOUTH 3 TIMES DAILY. STOP ATENOLOL AND CLONIDINE PATCH. 90 tablet 0   felodipine (PLENDIL) 5 MG 24 hr tablet TAKE 1 TABLET BY MOUTH ONCE DAILY 90 tablet 1   gabapentin (NEURONTIN) 100 MG capsule Take 1 capsule (100 mg total) by mouth 3 (three) times daily as needed. 90 capsule 3   hydrALAZINE (APRESOLINE) 25 MG tablet TAKE TWO TABLETS BY MOUTH  TWICE A DAY 360 tablet 1   hydrochlorothiazide (HYDRODIURIL) 25 MG tablet TAKE 1 TABLET BY MOUTH ONCE DAILY 90 tablet 3   irbesartan (AVAPRO) 300 MG tablet TAKE 1 TABLET BY MOUTH ONCE A DAY 90 tablet 3   Omega-3 Fatty Acids (FISH OIL PO) Take 2 capsules by mouth.      omeprazole (PRILOSEC) 40 MG capsule TAKE 1 CAPSULE BY MOUTH ONCE DAILY 90 capsule 3   potassium chloride (KLOR-CON) 10 MEQ tablet TAKE 1 TABLET BY MOUTH ONCE A DAY 90 tablet 3   Current Facility-Administered Medications on File Prior to Visit  Medication Dose Route Frequency Provider Last Rate Last Admin   denosumab (PROLIA) injection 60 mg  60 mg Subcutaneous Q6 months Susy Frizzle, MD   60 mg at 09/29/20 3614   No Known Allergies  Social History   Socioeconomic History   Marital status: Widowed    Spouse name: Not on file   Number of children: 1   Years of education: Not on file   Highest education level: Not on file  Occupational History   Not on file  Tobacco Use   Smoking status: Never   Smokeless tobacco: Never  Substance and  Sexual Activity   Alcohol use: Yes    Comment: wine occasionally   Drug use: No   Sexual activity: Not on file  Other Topics Concern   Not on file  Social History Narrative   Husband passed in 2021.   1 daughter.   2 grand children.   Social Determinants of Health   Financial Resource Strain: Low Risk  (12/15/2020)   Overall Financial Resource Strain (CARDIA)    Difficulty of Paying Living Expenses: Not hard at all  Food Insecurity: No Food Insecurity (12/15/2020)   Hunger Vital Sign    Worried About Running Out of Food in the Last Year: Never true    Ran Out of Food in the Last Year: Never true  Transportation Needs: No Transportation Needs (12/15/2020)   PRAPARE - Administrator, Civil Service (Medical): No    Lack of Transportation (Non-Medical): No  Physical Activity: Insufficiently Active (12/15/2020)   Exercise Vital Sign    Days of Exercise per Week:  3 days    Minutes of Exercise per Session: 10 min  Stress: No Stress Concern Present (12/15/2020)   [employer] of Occupational Health - Occupational Stress Questionnaire    Feeling of Stress : Only a little  Social Connections: Moderately Integrated (12/15/2020)   Social Connection and Isolation Panel [NHANES]    Frequency of Communication with Friends and Family: More than three times a week    Frequency of Social Gatherings with Friends and Family: More than three times a week    Attends Religious Services: More than 4 times per year    Active Member of [employer] or Organizations: Yes    Attends Banker Meetings: More than 4 times per year    Marital Status: Widowed  Intimate Partner Violence: Not At Risk (12/15/2020)   Humiliation, Afraid, Rape, and Kick questionnaire    Fear of Current or Ex-Partner: No    Emotionally Abused: No    Physically Abused: No    Sexually Abused: No     Review of Systems     Objective:   Physical Exam Constitutional:      General: She is not in acute distress.    Appearance: Normal appearance. She is normal weight. She is not ill-appearing or toxic-appearing.  Cardiovascular:     Rate and Rhythm: Normal rate and regular rhythm.     Pulses: Normal pulses.     Heart sounds: Normal heart sounds. No murmur heard.    No friction rub.  Pulmonary:     Effort: Pulmonary effort is normal. No respiratory distress.     Breath sounds: Normal breath sounds. No stridor. No wheezing, rhonchi or rales.  Musculoskeletal:        General: No swelling, tenderness, deformity or signs of injury.     Right lower leg: Normal. No swelling, deformity, tenderness or bony tenderness. No edema.     Left lower leg: Normal. No swelling, deformity, tenderness or bony tenderness. No edema.       Feet:  Skin:    Findings: Lesion present.  Neurological:     Mental Status: She is alert.          Assessment & Plan:  Skin lesion of foot I believe the  cortisone cream may be delaying healing.  I believe the granulation tissue in the center may be delaying healing.  First I recommended that she stop the cortisone cream.  Instead I asked her to apply the antibiotic salve such  as Silvadene to a Band-Aid and keep the area covered.  Recheck in 1 week.  If not healing, we may try silver nitrate to cauterize the granulation tissue in the center.  Reassess 3

## 2021-12-18 ENCOUNTER — Other Ambulatory Visit: Payer: Self-pay | Admitting: Family Medicine

## 2021-12-18 NOTE — Telephone Encounter (Signed)
Appointment in 2 days Requested Prescriptions  Pending Prescriptions Disp Refills   cloNIDine (CATAPRES) 0.2 MG tablet [Pharmacy Med Name: CLONIDINE HCL 0.2 MG TAB] 90 tablet 0    Sig: TAKE 1 TABLET BY MOUTH 3 TIMES DAILY. STOP ATENOLOL AND CLONIDINE PATCH.     Cardiovascular:  Alpha-2 Agonists Failed - 12/18/2021  2:14 PM      Failed - Valid encounter within last 6 months    Recent Outpatient Visits           7 months ago GAD (generalized anxiety disorder)   [facility] Pickard, Priscille Heidelberg, MD   1 year ago Neuropathy   [facility] Pickard, Priscille Heidelberg, MD   1 year ago Essential hypertension   [facility] Tanya Nones, Priscille Heidelberg, MD   2 years ago Tick bite, initial encounter   [facility] Tanya Nones, Priscille Heidelberg, MD   2 years ago Rhinosinusitis   [facility] Pickard, Priscille Heidelberg, MD       Future Appointments             In 2 days Tanya Nones Priscille Heidelberg, MD [facility]            Passed - Last BP in normal range    BP Readings from Last 1 Encounters:  11/30/21 112/72         Passed - Last Heart Rate in normal range    Pulse Readings from Last 1 Encounters:  11/30/21 72

## 2021-12-20 ENCOUNTER — Encounter: Payer: Self-pay | Admitting: Family Medicine

## 2021-12-20 ENCOUNTER — Ambulatory Visit (INDEPENDENT_AMBULATORY_CARE_PROVIDER_SITE_OTHER): Payer: PPO | Admitting: Family Medicine

## 2021-12-20 VITALS — BP 118/62 | HR 74 | Ht 61.0 in | Wt 115.0 lb

## 2021-12-20 DIAGNOSIS — L929 Granulomatous disorder of the skin and subcutaneous tissue, unspecified: Secondary | ICD-10-CM

## 2021-12-20 NOTE — Progress Notes (Signed)
Subjective:    Patient ID: Brittany Levy, female    DOB: 1935-01-10, 87 y.o.   MRN: [id number]  HPI Patient has a sore on the dorsum of her right foot.  It is in the webspace between her fourth and fifth toes.  Is approximately 8 mm in diameter.  It is a halo of hypopigmented tissue with a central area that is 5 mm in diameter of red granulation tissue.  She states that it will not heal.  She is been putting silvadene daily.     Past Medical History:  Diagnosis Date   Bilateral carotid artery stenosis    Chest pain    GERD (gastroesophageal reflux disease)    Hypertension    Insomnia    Osteoporosis of femur without pathological fracture    Past Surgical History:  Procedure Laterality Date   ABDOMINAL HYSTERECTOMY     BREAST BIOPSY Right 05/07/2016   FRAGMENTS OF LYMPH NODE   Current Outpatient Medications on File Prior to Visit  Medication Sig Dispense Refill   aspirin 81 MG tablet Take 81 mg by mouth daily.     atorvastatin (LIPITOR) 40 MG tablet Take 1 tablet (40 mg total) by mouth daily. 90 tablet 1   calcium-vitamin D (OSCAL WITH D) 250-125 MG-UNIT per tablet Take 1 tablet by mouth 2 (two) times daily.     carvedilol (COREG) 6.25 MG tablet TAKE 1 TABLET BY MOUTH TWICE A DAY WITH A MEAL 180 tablet 2   clonazePAM (KLONOPIN) 0.5 MG tablet TAKE 1 TABLET BY MOUTH TWICE A DAY AS NEEDED FOR ANXIETY 60 tablet 3   cloNIDine (CATAPRES) 0.2 MG tablet TAKE 1 TABLET BY MOUTH 3 TIMES DAILY. STOP ATENOLOL AND CLONIDINE PATCH. 90 tablet 0   felodipine (PLENDIL) 5 MG 24 hr tablet TAKE 1 TABLET BY MOUTH ONCE DAILY 90 tablet 1   gabapentin (NEURONTIN) 100 MG capsule Take 1 capsule (100 mg total) by mouth 3 (three) times daily as needed. 90 capsule 3   hydrALAZINE (APRESOLINE) 25 MG tablet TAKE TWO TABLETS BY MOUTH TWICE A DAY 360 tablet 1   hydrochlorothiazide (HYDRODIURIL) 25 MG tablet TAKE 1 TABLET BY MOUTH ONCE DAILY 90 tablet 3   irbesartan (AVAPRO) 300 MG tablet TAKE 1 TABLET BY MOUTH  ONCE A DAY 90 tablet 3   Omega-3 Fatty Acids (FISH OIL PO) Take 2 capsules by mouth.      omeprazole (PRILOSEC) 40 MG capsule TAKE 1 CAPSULE BY MOUTH ONCE DAILY 90 capsule 3   potassium chloride (KLOR-CON) 10 MEQ tablet TAKE 1 TABLET BY MOUTH ONCE A DAY 90 tablet 3   silver sulfADIAZINE (SILVADENE) 1 % cream Apply 1 Application topically daily. 50 g 0   Current Facility-Administered Medications on File Prior to Visit  Medication Dose Route Frequency Provider Last Rate Last Admin   denosumab (PROLIA) injection 60 mg  60 mg Subcutaneous Q6 months Susy Frizzle, MD   60 mg at 09/29/20 [id number]   No Known Allergies  Social History   Socioeconomic History   Marital status: Widowed    Spouse name: Not on file   Number of children: 1   Years of education: Not on file   Highest education level: Not on file  Occupational History   Not on file  Tobacco Use   Smoking status: Never   Smokeless tobacco: Never  Substance and Sexual Activity   Alcohol use: Yes    Comment: wine occasionally   Drug use: No  Sexual activity: Not on file  Other Topics Concern   Not on file  Social History Narrative   Husband passed in 2021.   1 daughter.   2 grand children.   Social Determinants of Health   Financial Resource Strain: Low Risk  (12/15/2020)   Overall Financial Resource Strain (CARDIA)    Difficulty of Paying Living Expenses: Not hard at all  Food Insecurity: No Food Insecurity (12/15/2020)   Hunger Vital Sign    Worried About Running Out of Food in the Last Year: Never true    Ran Out of Food in the Last Year: Never true  Transportation Needs: No Transportation Needs (12/15/2020)   PRAPARE - Administrator, Civil Service (Medical): No    Lack of Transportation (Non-Medical): No  Physical Activity: Insufficiently Active (12/15/2020)   Exercise Vital Sign    Days of Exercise per Week: 3 days    Minutes of Exercise per Session: 10 min  Stress: No Stress Concern Present  (12/15/2020)   [employer] of Occupational Health - Occupational Stress Questionnaire    Feeling of Stress : Only a little  Social Connections: Moderately Integrated (12/15/2020)   Social Connection and Isolation Panel [NHANES]    Frequency of Communication with Friends and Family: More than three times a week    Frequency of Social Gatherings with Friends and Family: More than three times a week    Attends Religious Services: More than 4 times per year    Active Member of [employer] or Organizations: Yes    Attends Banker Meetings: More than 4 times per year    Marital Status: Widowed  Intimate Partner Violence: Not At Risk (12/15/2020)   Humiliation, Afraid, Rape, and Kick questionnaire    Fear of Current or Ex-Partner: No    Emotionally Abused: No    Physically Abused: No    Sexually Abused: No     Review of Systems     Objective:   Physical Exam Constitutional:      General: She is not in acute distress.    Appearance: Normal appearance. She is normal weight. She is not ill-appearing or toxic-appearing.  Cardiovascular:     Rate and Rhythm: Normal rate and regular rhythm.     Pulses: Normal pulses.     Heart sounds: Normal heart sounds. No murmur heard.    No friction rub.  Pulmonary:     Effort: Pulmonary effort is normal. No respiratory distress.     Breath sounds: Normal breath sounds. No stridor. No wheezing, rhonchi or rales.  Musculoskeletal:        General: No swelling, tenderness, deformity or signs of injury.     Right lower leg: Normal. No swelling, deformity, tenderness or bony tenderness. No edema.     Left lower leg: Normal. No swelling, deformity, tenderness or bony tenderness. No edema.       Feet:  Skin:    Findings: Lesion present.  Neurological:     Mental Status: She is alert.          Assessment & Plan:  Excessive granulation tissue I treated the excessive granulation tissue with silver nitrate cautery.  Continue to apply  Polysporin and a Band-Aid daily and allow 1 to 2 weeks to heal.  Biopsy if persistent

## 2021-12-31 ENCOUNTER — Other Ambulatory Visit: Payer: Self-pay | Admitting: Family Medicine

## 2021-12-31 ENCOUNTER — Telehealth: Payer: Self-pay | Admitting: Family Medicine

## 2021-12-31 NOTE — Telephone Encounter (Signed)
I have lvm asking pt to rtn my call. It is time to schedule her Prolia shot. She will owe $315 at time of service this is a new process. If she would like [place] to bill her we can do that as well. Please schedule her a week out so we can order the shot.  CB# [phone]

## 2022-01-01 NOTE — Telephone Encounter (Signed)
Patient returned call and is scheduled for 01/09/22 I have ordered prolia.

## 2022-01-09 ENCOUNTER — Ambulatory Visit (INDEPENDENT_AMBULATORY_CARE_PROVIDER_SITE_OTHER): Payer: PPO

## 2022-01-09 DIAGNOSIS — M810 Age-related osteoporosis without current pathological fracture: Secondary | ICD-10-CM

## 2022-01-09 MED ORDER — DENOSUMAB 60 MG/ML ~~LOC~~ SOSY: 60.0000 mg | PREFILLED_SYRINGE | Freq: Once | SUBCUTANEOUS | Status: DC

## 2022-01-22 ENCOUNTER — Other Ambulatory Visit: Payer: Self-pay | Admitting: Family Medicine

## 2022-02-11 ENCOUNTER — Other Ambulatory Visit: Payer: Self-pay | Admitting: Family Medicine

## 2022-02-12 NOTE — Telephone Encounter (Signed)
Last OV 12/20/21.  Requested Prescriptions  Pending Prescriptions Disp Refills   hydrALAZINE (APRESOLINE) 25 MG tablet [Pharmacy Med Name: HYDRALAZINE HCL 25 MG TAB] 360 tablet 0    Sig: TAKE TWO TABLETS BY MOUTH TWICE A DAY     Cardiovascular:  Vasodilators Failed - 02/11/2022 12:09 PM      Failed - ANA Screen, Ifa, Serum in normal range and within 360 days    No results found for: "ANA", "ANATITER", "LABANTI"       Failed - Valid encounter within last 12 months    Recent Outpatient Visits           9 months ago GAD (generalized anxiety disorder)   [place] Pickard, Cammie Mcgee, MD   1 year ago Neuropathy   [place] Susy Frizzle, MD   1 year ago Essential hypertension   [place], Warren T, MD   2 years ago Tick bite, initial encounter   [place] Susy Frizzle, MD   2 years ago [place] Pickard, Cammie Mcgee, MD              Passed - HCT in normal range and within 360 days    HCT  Date Value Ref Range Status  09/04/2021 37.2 35.0 - 45.0 % Final         Passed - HGB in normal range and within 360 days    Hemoglobin  Date Value Ref Range Status  09/04/2021 12.4 11.7 - 15.5 g/dL Final         Passed - RBC in normal range and within 360 days    RBC  Date Value Ref Range Status  09/04/2021 4.27 3.80 - 5.10 Million/uL Final         Passed - WBC in normal range and within 360 days    WBC  Date Value Ref Range Status  09/04/2021 6.7 3.8 - 10.8 Thousand/uL Final         Passed - PLT in normal range and within 360 days    Platelets  Date Value Ref Range Status  09/04/2021 201 140 - 400 Thousand/uL Final         Passed - Last BP in normal range    BP Readings from Last 1 Encounters:  12/20/21 118/62

## 2022-02-13 ENCOUNTER — Ambulatory Visit (INDEPENDENT_AMBULATORY_CARE_PROVIDER_SITE_OTHER): Payer: PPO

## 2022-02-13 ENCOUNTER — Other Ambulatory Visit: Payer: Self-pay

## 2022-02-13 VITALS — BP 118/60 | HR 87 | Ht 61.0 in | Wt 115.0 lb

## 2022-02-13 DIAGNOSIS — I10 Essential (primary) hypertension: Secondary | ICD-10-CM

## 2022-02-13 DIAGNOSIS — I6523 Occlusion and stenosis of bilateral carotid arteries: Secondary | ICD-10-CM

## 2022-02-13 DIAGNOSIS — Z0000 Encounter for general adult medical examination without abnormal findings: Secondary | ICD-10-CM

## 2022-02-13 MED ORDER — CARVEDILOL 6.25 MG PO TABS: ORAL_TABLET | ORAL | 1 refills | Status: DC

## 2022-02-13 NOTE — Patient Instructions (Signed)
Brittany Levy , Thank you for taking time to come for your Medicare Wellness Visit. I appreciate your ongoing commitment to your health goals. Please review the following plan we discussed and let me know if I can assist you in the future.   These are the goals we discussed:  Goals      Exercise 3x per week (30 min per time)     Continue to maintain good health.     Pharmacy Care Plan:     CARE PLAN ENTRY (see longitudinal plan of care for additional care plan information)  Current Barriers:  Chronic Disease Management support, education, and care coordination needs related to Hypertension, Anxiety, and insomnia   Hypertension BP Readings from Last 3 Encounters:  09/30/19 (!) 160/80  08/17/19 (!) 150/68  07/30/19 140/80  Pharmacist Clinical Goal(s): Over the next 14 days, patient will work with PharmD and providers to achieve BP goal <140/90 Current regimen:  Carvedilol 6.25mg  bid Clonidine 0.2mg  tid except one-half tablet in the am Felodipine 5mg  daily Hydralazine 25mg  2 tablets po bid HCTZ 25mg  Irbesartan 300mg  daily Interventions: Reviewed home monitoring of BP Recommended night time dosing of losartan Counseled on medication adherence Recommended physical activity and interaction whenever possible Patient self care activities - Over the next 14 days, patient will: Check BP daily, document, and provide at future appointments Ensure daily salt intake < 2300 mg/day Trial losartan at bedtime for 1 week and follow up with provider  Insomnia Pharmacist Clinical Goal(s) Over the next 14 days, patient will work with PharmD and providers to optimize medication and minimize symptoms of insomnia. Current regimen:  No medications Interventions: Reviewed current sleep patterns Recommended appointment with PCP to discuss resuming previous medications Patient self care activities - Over the next 14 days, patient will: Notify providers with any new or worsening  symptoms   GAD Pharmacist Clinical Goal(s) Over the next 14 days, patient will work with PharmD and providers to optimize medication and minimize symptoms of anxiety. Current regimen:  No medications at this time Interventions: Reviewed current symptoms of anxiety Comprehensive medication review Patient self care activities - Over the next 14 days, patient will: Notify providers of any new or worsening symptoms Initial goal documentation      Track and Manage My Blood Pressure-Hypertension     Timeframe:  Long-Range Goal Priority:  High Start Date:    09/07/20                         Expected End Date:   03/10/21                    Follow Up Date 01/03/21    - check blood pressure daily - choose a place to take my blood pressure (home, clinic or office, retail store) - write blood pressure results in a log or diary    Why is this important?   You won't feel high blood pressure, but it can still hurt your blood vessels.  High blood pressure can cause heart or kidney problems. It can also cause a stroke.  Making lifestyle changes like losing a little weight or eating less salt will help.  Checking your blood pressure at home and at different times of the day can help to control blood pressure.  If the doctor prescribes medicine remember to take it the way the doctor ordered.  Call the office if you cannot afford the medicine or if there are questions  about it.     Notes:         This is a list of the screening recommended for you and due dates:  Health Maintenance  Topic Date Due   COVID-19 Vaccine (3 - 2023-24 season) 02/27/2022*   Zoster (Shingles) Vaccine (2 of 2) 02/27/2022*   Flu Shot  05/05/2022*   Medicare Annual Wellness Visit  02/14/2023   Pneumonia Vaccine  Completed   DEXA scan (bone density measurement)  Completed   HPV Vaccine  Aged Out   DTaP/Tdap/Td vaccine  Discontinued  *Topic was postponed. The date shown is not the original due date.    Advanced  directives: Please bring a copy of your health care power of attorney and living will to the office to be added to your chart at your convenience.   Conditions/risks identified: Aim for 30 minutes of exercise or brisk walking, 6-8 glasses of water, and 5 servings of fruits and vegetables each day. KEEP UP THE GOOD WORK!!  Next appointment: Follow up in one year for your annual wellness visit 02/2023.   Preventive Care 7665 Years and Older, Female Preventive care refers to lifestyle choices and visits with your health care provider that can promote health and wellness. What does preventive care include? A yearly physical exam. This is also called an annual well check. Dental exams once or twice a year. Routine eye exams. Ask your health care provider how often you should have your eyes checked. Personal lifestyle choices, including: Daily care of your teeth and gums. Regular physical activity. Eating a healthy diet. Avoiding tobacco and drug use. Limiting alcohol use. Practicing safe sex. Taking low-dose aspirin every day. Taking vitamin and mineral supplements as recommended by your health care provider. What happens during an annual well check? The services and screenings done by your health care provider during your annual well check will depend on your age, overall health, lifestyle risk factors, and family history of disease. Counseling  Your health care provider may ask you questions about your: Alcohol use. Tobacco use. Drug use. Emotional well-being. Home and relationship well-being. Sexual activity. Eating habits. History of falls. Memory and ability to understand (cognition). Work and work Astronome[employer]vironment. Reproductive health. Screening  You may have the following tests or measurements: Height, weight, and BMI. Blood pressure. Lipid and cholesterol levels. These may be checked every 5 years, or more frequently if you are over 553 years old. Skin check. Lung cancer  screening. You may have this screening every year starting at age 86 if you have a 30-pack-year history of smoking and currently smoke or have quit within the past 15 years. Fecal occult blood test (FOBT) of the stool. You may have this test every year starting at age 86. Flexible sigmoidoscopy or colonoscopy. You may have a sigmoidoscopy every 5 years or a colonoscopy every 10 years starting at age 86. Hepatitis C blood test. Hepatitis B blood test. Sexually transmitted disease (STD) testing. Diabetes screening. This is done by checking your blood sugar (glucose) after you have not eaten for a while (fasting). You may have this done every 1-3 years. Bone density scan. This is done to screen for osteoporosis. You may have this done starting at age 86. Mammogram. This may be done every 1-2 years. Talk to your health care provider about how often you should have regular mammograms. Talk with your health care provider about your test results, treatment options, and if necessary, the need for more tests. Vaccines  Your health care  provider may recommend certain vaccines, such as: Influenza vaccine. This is recommended every year. Tetanus, diphtheria, and acellular pertussis (Tdap, Td) vaccine. You may need a Td booster every 10 years. Zoster vaccine. You may need this after age 52. Pneumococcal 13-valent conjugate (PCV13) vaccine. One dose is recommended after age 68. Pneumococcal polysaccharide (PPSV23) vaccine. One dose is recommended after age 57. Talk to your health care provider about which screenings and vaccines you need and how often you need them. This information is not intended to replace advice given to you by your health care provider. Make sure you discuss any questions you have with your health care provider. Document Released: 02/17/2015 Document Revised: 10/11/2015 Document Reviewed: 11/22/2014 Elsevier Interactive Patient Education  2017 [employer].  Fall Prevention in the  Home Falls can cause injuries. They can happen to people of all ages. There are many things you can do to make your home safe and to help prevent falls. What can I do on the outside of my home? Regularly fix the edges of walkways and driveways and fix any cracks. Remove anything that might make you trip as you walk through a door, such as a raised step or threshold. Trim any bushes or trees on the path to your home. Use bright outdoor lighting. Clear any walking paths of anything that might make someone trip, such as rocks or tools. Regularly check to see if handrails are loose or broken. Make sure that both sides of any steps have handrails. Any raised decks and porches should have guardrails on the edges. Have any leaves, snow, or ice cleared regularly. Use sand or salt on walking paths during winter. Clean up any spills in your garage right away. This includes oil or grease spills. What can I do in the bathroom? Use night lights. Install grab bars by the toilet and in the tub and shower. Do not use towel bars as grab bars. Use non-skid mats or decals in the tub or shower. If you need to sit down in the shower, use a plastic, non-slip stool. Keep the floor dry. Clean up any water that spills on the floor as soon as it happens. Remove soap buildup in the tub or shower regularly. Attach bath mats securely with double-sided non-slip rug tape. Do not have throw rugs and other things on the floor that can make you trip. What can I do in the bedroom? Use night lights. Make sure that you have a light by your bed that is easy to reach. Do not use any sheets or blankets that are too big for your bed. They should not hang down onto the floor. Have a firm chair that has side arms. You can use this for support while you get dressed. Do not have throw rugs and other things on the floor that can make you trip. What can I do in the kitchen? Clean up any spills right away. Avoid walking on wet  floors. Keep items that you use a lot in easy-to-reach places. If you need to reach something above you, use a strong step stool that has a grab bar. Keep electrical cords out of the way. Do not use floor polish or wax that makes floors slippery. If you must use wax, use non-skid floor wax. Do not have throw rugs and other things on the floor that can make you trip. What can I do with my stairs? Do not leave any items on the stairs. Make sure that there are handrails on both  sides of the stairs and use them. Fix handrails that are broken or loose. Make sure that handrails are as long as the stairways. Check any carpeting to make sure that it is firmly attached to the stairs. Fix any carpet that is loose or worn. Avoid having throw rugs at the top or bottom of the stairs. If you do have throw rugs, attach them to the floor with carpet tape. Make sure that you have a light switch at the top of the stairs and the bottom of the stairs. If you do not have them, ask someone to add them for you. What else can I do to help prevent falls? Wear shoes that: Do not have high heels. Have rubber bottoms. Are comfortable and fit you well. Are closed at the toe. Do not wear sandals. If you use a stepladder: Make sure that it is fully opened. Do not climb a closed stepladder. Make sure that both sides of the stepladder are locked into place. Ask someone to hold it for you, if possible. Clearly mark and make sure that you can see: Any grab bars or handrails. First and last steps. Where the edge of each step is. Use tools that help you move around (mobility aids) if they are needed. These include: Canes. Walkers. Scooters. Crutches. Turn on the lights when you go into a dark area. Replace any light bulbs as soon as they burn out. Set up your furniture so you have a clear path. Avoid moving your furniture around. If any of your floors are uneven, fix them. If there are any pets around you, be aware of  where they are. Review your medicines with your doctor. Some medicines can make you feel dizzy. This can increase your chance of falling. Ask your doctor what other things that you can do to help prevent falls. This information is not intended to replace advice given to you by your health care provider. Make sure you discuss any questions you have with your health care provider. Document Released: 11/17/2008 Document Revised: 06/29/2015 Document Reviewed: 02/25/2014 Elsevier Interactive Patient Education  2017 [employer].

## 2022-02-13 NOTE — Progress Notes (Signed)
Subjective:   Brittany Levy is a 88 y.o. female who presents for Medicare Annual (Subsequent) preventive examination. IN OFFICE VISIT. Review of Systems    Cardiac Risk Factors include: advanced age (>86men, >23 women);hypertension;sedentary lifestyle;Other (see comment), Risk factor comments: Osteoporosis     Objective:    Today's Vitals   02/13/22 0932  BP: 118/60  Pulse: 87  SpO2: 99%  Weight: 115 lb (52.2 kg)  Height: 5\' 1"  (1.549 m)   Body mass index is 21.73 kg/m.     02/13/2022    9:46 AM 12/15/2020    9:12 AM  Advanced Directives  Does Patient Have a Medical Advance Directive? Yes Yes  Type of 13/12/2020 of [place];Living will Living will  Copy of Healthcare Power of Attorney in Chart? No - copy requested   Would patient like information on creating a medical advance directive?  No - Patient declined    Current Medications (verified) Outpatient Encounter Medications as of 02/13/2022  Medication Sig   aspirin 81 MG tablet Take 81 mg by mouth daily.   atorvastatin (LIPITOR) 40 MG tablet TAKE 1 TABLET BY MOUTH ONCE A DAY   calcium-vitamin D (OSCAL WITH D) 250-125 MG-UNIT per tablet Take 1 tablet by mouth 2 (two) times daily.   carvedilol (COREG) 6.25 MG tablet TAKE 1 TABLET BY MOUTH TWICE A DAY WITH A MEAL   clonazePAM (KLONOPIN) 0.5 MG tablet TAKE 1 TABLET BY MOUTH TWICE A DAY AS NEEDED FOR ANXIETY   cloNIDine (CATAPRES) 0.2 MG tablet TAKE 1 TABLET BY MOUTH 3 TIMES DAILY. STOP ATENOLOL AND CLONIDINE PATCH.   felodipine (PLENDIL) 5 MG 24 hr tablet TAKE 1 TABLET BY MOUTH ONCE DAILY   gabapentin (NEURONTIN) 100 MG capsule Take 1 capsule (100 mg total) by mouth 3 (three) times daily as needed.   hydrALAZINE (APRESOLINE) 25 MG tablet TAKE TWO TABLETS BY MOUTH TWICE A DAY   hydrochlorothiazide (HYDRODIURIL) 25 MG tablet TAKE 1 TABLET BY MOUTH ONCE DAILY   irbesartan (AVAPRO) 300 MG tablet TAKE 1 TABLET BY MOUTH ONCE A DAY   Omega-3 Fatty  Acids (FISH OIL PO) Take 2 capsules by mouth.    omeprazole (PRILOSEC) 40 MG capsule TAKE 1 CAPSULE BY MOUTH ONCE DAILY   potassium chloride (KLOR-CON) 10 MEQ tablet TAKE 1 TABLET BY MOUTH ONCE A DAY   silver sulfADIAZINE (SILVADENE) 1 % cream Apply 1 Application topically daily.   Facility-Administered Encounter Medications as of 02/13/2022  Medication   denosumab (PROLIA) injection 60 mg    Allergies (verified) Patient has no known allergies.   History: Past Medical History:  Diagnosis Date   Bilateral carotid artery stenosis    Chest pain    GERD (gastroesophageal reflux disease)    Hypertension    Insomnia    Osteoporosis of femur without pathological fracture    Past Surgical History:  Procedure Laterality Date   ABDOMINAL HYSTERECTOMY     BREAST BIOPSY Right 05/07/2016   FRAGMENTS OF LYMPH NODE   Family History  Problem Relation Age of Onset   Congestive Heart Failure Mother    Stroke Father    Hypertension Father    Breast cancer Sister 34   Heart disease Brother    Heart disease Brother    Breast cancer Maternal Aunt 58   Breast cancer Paternal Aunt        after menopause   Social History   Socioeconomic History   Marital status: Widowed    Spouse  name: Not on file   Number of children: 1   Years of education: Not on file   Highest education level: Not on file  Occupational History   Not on file  Tobacco Use   Smoking status: Never   Smokeless tobacco: Never  Substance and Sexual Activity   Alcohol use: Yes    Comment: wine occasionally   Drug use: No   Sexual activity: Not on file  Other Topics Concern   Not on file  Social History Narrative   Husband passed in 2021.   1 daughter.   2 grand children.   Social Determinants of Health   Financial Resource Strain: Low Risk  (02/13/2022)   Overall Financial Resource Strain (CARDIA)    Difficulty of Paying Living Expenses: Not hard at all  Food Insecurity: No Food Insecurity (02/13/2022)    Hunger Vital Sign    Worried About Running Out of Food in the Last Year: Never true    Ran Out of Food in the Last Year: Never true  Transportation Needs: No Transportation Needs (02/13/2022)   PRAPARE - Hydrologist (Medical): No    Lack of Transportation (Non-Medical): No  Physical Activity: Sufficiently Active (02/13/2022)   Exercise Vital Sign    Days of Exercise per Week: 5 days    Minutes of Exercise per Session: 30 min  Stress: No Stress Concern Present (02/13/2022)   [place]    Feeling of Stress : Not at all  Social Connections: [place] (02/13/2022)   Social Connection and Isolation Panel [NHANES]    Frequency of Communication with Friends and Family: More than three times a week    Frequency of Social Gatherings with Friends and Family: More than three times a week    Attends Religious Services: More than 4 times per year    Active Member of [employer] or Organizations: Yes    Attends Music therapist: More than 4 times per year    Marital Status: Married    Tobacco Counseling Counseling given: Not Answered   Clinical Intake:  Pre-visit preparation completed: Yes  Pain : No/denies pain     BMI - recorded: 21.73 Nutritional Status: BMI of 19-24  Normal Nutritional Risks: None Diabetes: No  How often do you need to have someone help you when you read instructions, pamphlets, or other written materials from your doctor or pharmacy?: 1 - Never  Diabetic?NO   Interpreter Needed?: No  Information entered by :: mj Alizea Pell, lpn   Activities of Daily Living    02/13/2022    9:47 AM  In your present state of health, do you have any difficulty performing the following activities:  Hearing? 0  Vision? 0  Difficulty concentrating or making decisions? 0  Walking or climbing stairs? 0  Dressing or bathing? 0  Doing errands, shopping? 0  Preparing Food  and eating ? N  Using the Toilet? N  In the past six months, have you accidently leaked urine? N  Do you have problems with loss of bowel control? N  Managing your Medications? N  Managing your Finances? N  Housekeeping or managing your Housekeeping? N    Patient Care Team: Susy Frizzle, MD as PCP - General (Family Medicine) Edythe Clarity, [facility] as Pharmacist (Pharmacist)  Indicate any recent Medical Services you may have received from other than [facility] providers in the past year (date may be approximate).  Assessment:   This is a routine wellness examination for [place].  Hearing/Vision screen Vision Screening - Comments:: Contact lens in L eye. 01/2022. Dr Marica Otter.  Dietary issues and exercise activities discussed: Current Exercise Habits: Home exercise routine, Type of exercise: walking, Time (Minutes): 30, Frequency (Times/Week): 5, Weekly Exercise (Minutes/Week): 150, Intensity: Mild, Exercise limited by: neurologic condition(s);cardiac condition(s);orthopedic condition(s)   Goals Addressed             This Visit's Progress    Exercise 3x per week (30 min per time)   On track    Continue to maintain good health.       Depression Screen    02/13/2022    9:40 AM 12/20/2021   11:53 AM 11/30/2021    8:01 AM 11/27/2021    7:53 AM 12/15/2020    9:04 AM 04/13/2020   10:11 AM 08/18/2018    8:17 AM  PHQ 2/9 Scores  PHQ - 2 Score 0 0 0 0 0 0 2  PHQ- 9 Score 0 0 0 0  6 6    Fall Risk    02/13/2022    9:47 AM 12/20/2021   11:53 AM 11/30/2021    8:01 AM 11/27/2021    7:53 AM 12/15/2020    9:13 AM  Fall Risk   Falls in the past year? 0 0 0 0 0  Number falls in past yr: 0 0 0  0  Injury with Fall? 0 0 0  0  Risk for fall due to : No Fall Risks No Fall Risks No Fall Risks  No Fall Risks  Follow up Falls prevention discussed Falls prevention discussed Falls prevention discussed  Falls prevention discussed    FALL RISK PREVENTION PERTAINING TO THE  HOME:  Any stairs in or around the home? Yes  If so, are there any without handrails? No  Home free of loose throw rugs in walkways, pet beds, electrical cords, etc? Yes  Adequate lighting in your home to reduce risk of falls? Yes   ASSISTIVE DEVICES UTILIZED TO PREVENT FALLS:  Life alert? Yes  Use of a cane, walker or w/c? No  Grab bars in the bathroom? Yes  Shower chair or bench in shower? No  Elevated toilet seat or a handicapped toilet? Yes   TIMED UP AND GO:  Was the test performed? Yes .  Length of time to ambulate 10 feet: 15 sec.   Gait steady and fast without use of assistive device  Cognitive Function:        02/13/2022    9:49 AM 12/15/2020    9:19 AM  6CIT Screen  What Year? 0 points 0 points  What month? 0 points 0 points  What time? 0 points 0 points  Count back from 20 0 points 0 points  Months in reverse 0 points 0 points  Repeat phrase 2 points 2 points  Total Score 2 points 2 points    Immunizations Immunization History  Administered Date(s) Administered   Fluad Quad(high Dose 65+) 10/08/2018   Influenza Split 10/18/2010   Influenza,inj,Quad PF,6+ Mos 10/08/2012, 10/06/2013, 10/09/2016, 11/14/2017   Influenza-Unspecified 03/09/2015, 11/14/2020, 11/02/2021   PFIZER(Purple Top)SARS-COV-2 Vaccination 02/21/2019, 03/11/2019   Pneumococcal Conjugate-13 02/12/2013   Pneumococcal Polysaccharide-23 02/05/1999, 02/15/2014   Zoster Recombinat (Shingrix) 06/26/2021   Zoster, Live 09/18/2011    TDAP status: Up to date  Flu Vaccine status: Up to date  Pneumococcal vaccine status: Up to date  Covid-19 vaccine status: Completed vaccines  Qualifies for  Shingles Vaccine? Yes   Zostavax completed Yes   Shingrix Completed?: No.    Education has been provided regarding the importance of this vaccine. Patient has been advised to call insurance company to determine out of pocket expense if they have not yet received this vaccine. Advised may also receive  vaccine at local pharmacy or Health Dept. Verbalized acceptance and understanding.  Screening Tests Health Maintenance  Topic Date Due   COVID-19 Vaccine (3 - 2023-24 season) 02/27/2022 (Originally 10/05/2021)   Zoster Vaccines- Shingrix (2 of 2) 02/27/2022 (Originally 08/21/2021)   Medicare Annual Wellness (AWV)  02/14/2023   Pneumonia Vaccine 65+ Years old  Completed   INFLUENZA VACCINE  Completed   DEXA SCAN  Completed   HPV VACCINES  Aged Out   DTaP/Tdap/Td  Discontinued    Health Maintenance  There are no preventive care reminders to display for this patient.   Colorectal cancer screening: No longer required.   Mammogram status: No longer required due to AGE.  Bone Density status: Completed 07/06/2020. Results reflect: Bone density results: NORMAL. Repeat every 2 years.  Lung Cancer Screening: (Low Dose CT Chest recommended if Age 77-80 years, 30 pack-year currently smoking OR have quit w/in 15years.) does not qualify.   Lung Cancer Screening Referral: N/A  Additional Screening:  Hepatitis C Screening: does not qualify; Completed N/A  Vision Screening: Recommended annual ophthalmology exams for early detection of glaucoma and other disorders of the eye. Is the patient up to date with their annual eye exam?  Yes  Who is the provider or what is the name of the office in which the patient attends annual eye exams? Dr. Mickel Baas If pt is not established with a provider, would they like to be referred to a provider to establish care? No .   Dental Screening: Recommended annual dental exams for proper oral hygiene  Community Resource Referral / Chronic Care Management: CRR required this visit?  No   CCM required this visit?  No      Plan:     I have personally reviewed and noted the following in the patient's chart:   Medical and social history Use of alcohol, tobacco or illicit drugs  Current medications and supplements including opioid prescriptions. Patient is  not currently taking opioid prescriptions. Functional ability and status Nutritional status Physical activity Advanced directives List of other physicians Hospitalizations, surgeries, and ER visits in previous 12 months Vitals Screenings to include cognitive, depression, and falls Referrals and appointments  In addition, I have reviewed and discussed with patient certain preventive protocols, quality metrics, and best practice recommendations. A written personalized care plan for preventive services as well as general preventive health recommendations were provided to patient.     Chriss Driver, LPN   [id number]/42/[id number]   Nurse Notes: Pt is up to date on all age appropriate health maintenance and vaccines.

## 2022-02-28 ENCOUNTER — Other Ambulatory Visit: Payer: Self-pay | Admitting: Family Medicine

## 2022-03-13 ENCOUNTER — Other Ambulatory Visit: Payer: Self-pay | Admitting: Family Medicine

## 2022-03-13 DIAGNOSIS — H6123 Impacted cerumen, bilateral: Secondary | ICD-10-CM | POA: Diagnosis not present

## 2022-03-13 DIAGNOSIS — H903 Sensorineural hearing loss, bilateral: Secondary | ICD-10-CM | POA: Diagnosis not present

## 2022-04-09 ENCOUNTER — Other Ambulatory Visit: Payer: Self-pay | Admitting: Family Medicine

## 2022-04-09 MED ORDER — FELODIPINE ER 5 MG PO TB24: 5.0000 mg | ORAL_TABLET | Freq: Every day | ORAL | 0 refills | Status: DC

## 2022-04-09 MED ORDER — IRBESARTAN 300 MG PO TABS: 300.0000 mg | ORAL_TABLET | Freq: Every day | ORAL | 0 refills | Status: DC

## 2022-04-09 NOTE — Telephone Encounter (Signed)
Pharmacist also requesting new script for  felodipine (PLENDIL) 5 MG 24 hr tablet   Please advise pharmacist.

## 2022-04-09 NOTE — Telephone Encounter (Signed)
Prescription Request  04/09/2022  LOV: 12/20/2021  What is the name of the medication or equipment?   irbesartan (AVAPRO) 300 MG tablet  **PHARMACIST REQUESTING NEW SCRIPT*  Have you contacted your pharmacy to request a refill? Yes   Which pharmacy would you like this sent to?  [facility], [address] [place] [address] Phone: [phone] Fax: [phone]    Patient notified that their request is being sent to the clinical staff for review and that they should receive a response within 2 business days.   Please advise pharmacist.

## 2022-04-09 NOTE — Telephone Encounter (Signed)
Requested Prescriptions  Pending Prescriptions Disp Refills   felodipine (PLENDIL) 5 MG 24 hr tablet 90 tablet 0    Sig: Take 1 tablet (5 mg total) by mouth daily.     Cardiovascular: Calcium Channel Blockers 2 Failed - 04/09/2022  1:29 PM      Failed - Valid encounter within last 6 months    Recent Outpatient Visits           10 months ago GAD (generalized anxiety disorder)   [place] Pickard, Cammie Mcgee, MD   1 year ago Neuropathy   [place] Pickard, Cammie Mcgee, MD   1 year ago Essential hypertension   [place], Cammie Mcgee, MD   2 years ago Tick bite, initial encounter   [place] Schaumann, Cammie Mcgee, MD   2 years ago [place] Pickard, Cammie Mcgee, MD              Passed - Last BP in normal range    BP Readings from Last 1 Encounters:  02/13/22 118/60         Passed - Last Heart Rate in normal range    Pulse Readings from Last 1 Encounters:  02/13/22 87          irbesartan (AVAPRO) 300 MG tablet 90 tablet 0    Sig: Take 1 tablet (300 mg total) by mouth daily.     Cardiovascular:  Angiotensin Receptor Blockers Failed - 04/09/2022  1:29 PM      Failed - Cr in normal range and within 180 days    Creat  Date Value Ref Range Status  09/04/2021 0.70 0.60 - 0.95 mg/dL Final         Failed - K in normal range and within 180 days    Potassium  Date Value Ref Range Status  09/04/2021 4.2 3.5 - 5.3 mmol/L Final         Failed - Valid encounter within last 6 months    Recent Outpatient Visits           10 months ago GAD (generalized anxiety disorder)   [place] Susy Frizzle, MD   1 year ago Neuropathy   [place] Pickard, Cammie Mcgee, MD   1 year ago Essential hypertension   [place] Denn[place] Schaumann, Cammie Mcgee, MD   2 years ago Tick bite, initial encounter   [place] Susy Frizzle, MD   2 years ago [place], Cammie Mcgee, MD              Passed - Patient is not pregnant      Passed - Last BP in normal range    BP Readings from Last 1 Encounters:  02/13/22 118/60

## 2022-04-12 ENCOUNTER — Ambulatory Visit (INDEPENDENT_AMBULATORY_CARE_PROVIDER_SITE_OTHER): Payer: PPO | Admitting: Family Medicine

## 2022-04-12 ENCOUNTER — Encounter: Payer: Self-pay | Admitting: Family Medicine

## 2022-04-12 VITALS — BP 128/68 | HR 94 | Temp 100.5°F | Ht 61.0 in | Wt 110.6 lb

## 2022-04-12 DIAGNOSIS — J069 Acute upper respiratory infection, unspecified: Secondary | ICD-10-CM

## 2022-04-12 MED ORDER — NIRMATRELVIR/RITONAVIR (PAXLOVID)TABLET: 3.0000 | ORAL_TABLET | Freq: Two times a day (BID) | ORAL | 0 refills | Status: AC

## 2022-04-12 NOTE — Progress Notes (Signed)
Subjective:    Patient ID: Brittany Levy, female    DOB: March 11, 1934, 88 y.o.   MRN: [id number]  HPI Patient is a very pleasant 88 year old Caucasian female who presents today with fever, body aches, head congestion, cough, and general malaise.  Fever began yesterday.  Fevers 100.4.  She has not taken a COVID test.  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  She was exposed to a person who had [place].  Shortly thereafter she developed symptoms.  She denies any chest pain or pleurisy or hemoptysis or shortness of breath Past Medical History:  Diagnosis Date   Bilateral carotid artery stenosis    Chest pain    GERD (gastroesophageal reflux disease)    Hypertension    Insomnia    Osteoporosis of femur without pathological fracture    Past Surgical History:  Procedure Laterality Date   ABDOMINAL HYSTERECTOMY     BREAST BIOPSY Right 05/07/2016   FRAGMENTS OF LYMPH NODE   Current Outpatient Medications on File Prior to Visit  Medication Sig Dispense Refill   aspirin 81 MG tablet Take 81 mg by mouth daily.     atorvastatin (LIPITOR) 40 MG tablet TAKE 1 TABLET BY MOUTH ONCE A DAY 90 tablet 1   calcium-vitamin D (OSCAL WITH D) 250-125 MG-UNIT per tablet Take 1 tablet by mouth 2 (two) times daily.     carvedilol (COREG) 6.25 MG tablet TAKE 1 TABLET BY MOUTH TWICE A DAY WITH A MEAL 90 tablet 1   clonazePAM (KLONOPIN) 0.5 MG tablet TAKE 1 TABLET BY MOUTH TWICE A DAY AS NEEDED FOR ANXIETY 60 tablet 2   cloNIDine (CATAPRES) 0.2 MG tablet TAKE 1 TABLET BY MOUTH 3 TIMES DAILY. STOP ATENOLOL AND CLONIDINE PATCH. 90 tablet 0   felodipine (PLENDIL) 5 MG 24 hr tablet Take 1 tablet (5 mg total) by mouth daily. 90 tablet 0   gabapentin (NEURONTIN) 100 MG capsule Take 1 capsule (100 mg total) by mouth 3 (three) times daily as needed. 90 capsule 3   hydrALAZINE (APRESOLINE) 25 MG tablet TAKE TWO TABLETS BY MOUTH TWICE A DAY 360 tablet 0   hydrochlorothiazide (HYDRODIURIL) 25 MG tablet  TAKE 1 TABLET BY MOUTH ONCE DAILY 90 tablet 3   irbesartan (AVAPRO) 300 MG tablet Take 1 tablet (300 mg total) by mouth daily. 90 tablet 0   Omega-3 Fatty Acids (FISH OIL PO) Take 2 capsules by mouth.      omeprazole (PRILOSEC) 40 MG capsule TAKE 1 CAPSULE BY MOUTH ONCE DAILY 90 capsule 3   potassium chloride (KLOR-CON) 10 MEQ tablet TAKE 1 TABLET BY MOUTH ONCE A DAY 90 tablet 3   silver sulfADIAZINE (SILVADENE) 1 % cream Apply 1 Application topically daily. 50 g 0   Current Facility-Administered Medications on File Prior to Visit  Medication Dose Route Frequency Provider Last Rate Last Admin   denosumab (PROLIA) injection 60 mg  60 mg Subcutaneous Once Susy Frizzle, MD       No Known Allergies  Social History   Socioeconomic History   Marital status: Widowed    Spouse name: Not on file   Number of children: 1   Years of education: Not on file   Highest education level: Not on file  Occupational History   Not on file  Tobacco Use   Smoking status: Never   Smokeless tobacco: Never  Substance and Sexual Activity   Alcohol use: Yes    Comment: wine occasionally   Drug  use: No   Sexual activity: Not on file  Other Topics Concern   Not on file  Social History Narrative   Husband passed in 2021.   1 daughter.   2 grand children.   Social Determinants of Health   Financial Resource Strain: Low Risk  (02/13/2022)   Overall Financial Resource Strain (CARDIA)    Difficulty of Paying Living Expenses: Not hard at all  Food Insecurity: No Food Insecurity (02/13/2022)   Hunger Vital Sign    Worried About Running Out of Food in the Last Year: Never true    Ran Out of Food in the Last Year: Never true  Transportation Needs: No Transportation Needs (02/13/2022)   PRAPARE - Hydrologist (Medical): No    Lack of Transportation (Non-Medical): No  Physical Activity: Sufficiently Active (02/13/2022)   Exercise Vital Sign    Days of Exercise per Week: 5 days     Minutes of Exercise per Session: 30 min  Stress: No Stress Concern Present (02/13/2022)   [place]    Feeling of Stress : Not at all  Social Connections: [place] (02/13/2022)   Social Connection and Isolation Panel [NHANES]    Frequency of Communication with Friends and Family: More than three times a week    Frequency of Social Gatherings with Friends and Family: More than three times a week    Attends Religious Services: More than 4 times per year    Active Member of [employer] or Organizations: Yes    Attends Music therapist: More than 4 times per year    Marital Status: Married  Human resources officer Violence: Not At Risk (02/13/2022)   Humiliation, Afraid, Rape, and Kick questionnaire    Fear of Current or Ex-Partner: No    Emotionally Abused: No    Physically Abused: No    Sexually Abused: No     Review of Systems     Objective:   Physical Exam Constitutional:      General: She is not in acute distress.    Appearance: Normal appearance. She is normal weight. She is not ill-appearing or toxic-appearing.  HENT:     Right Ear: Tympanic membrane and ear canal normal.     Left Ear: Tympanic membrane and ear canal normal.     Nose: Congestion and rhinorrhea present.     Mouth/Throat:     Pharynx: No oropharyngeal exudate or posterior oropharyngeal erythema.  Eyes:     Extraocular Movements: Extraocular movements intact.     Conjunctiva/sclera: Conjunctivae normal.     Pupils: Pupils are equal, round, and reactive to light.  Cardiovascular:     Rate and Rhythm: Normal rate and regular rhythm.     Pulses: Normal pulses.     Heart sounds: Normal heart sounds. No murmur heard.    No friction rub.  Pulmonary:     Effort: Pulmonary effort is normal. No respiratory distress.     Breath sounds: Normal breath sounds. No stridor. No wheezing, rhonchi or rales.  Abdominal:     General: Abdomen is  flat. Bowel sounds are normal.     Palpations: Abdomen is soft.  Musculoskeletal:     Right lower leg: Normal. No swelling, deformity, tenderness or bony tenderness.     Left lower leg: Normal. No swelling, deformity, tenderness or bony tenderness.  Neurological:     General: No focal deficit present.     Mental Status:  She is alert and oriented to person, place, and time.     Cranial Nerves: No cranial nerve deficit.     Sensory: No sensory deficit.     Motor: No weakness.     Coordination: Coordination normal.     Gait: Gait normal.     Deep Tendon Reflexes: Reflexes normal.          Assessment & Plan:  Viral upper respiratory tract infection Patient's history and physical exam support a viral upper respiratory infection.  Given her recent exposure to [place] and her fever I suspect that she likely has this as well.  I do not have any rapid COVID test available.  Therefore recommended the patient go home and take a home COVID test.  If positive, I recommended starting Paxlovid 3 tablets twice daily for 5 days along with supportive care.  If negative, I would recommend supportive care only

## 2022-04-22 ENCOUNTER — Other Ambulatory Visit: Payer: Self-pay | Admitting: Family Medicine

## 2022-04-26 ENCOUNTER — Encounter: Payer: Self-pay | Admitting: Family Medicine

## 2022-04-26 ENCOUNTER — Ambulatory Visit (INDEPENDENT_AMBULATORY_CARE_PROVIDER_SITE_OTHER): Payer: PPO | Admitting: Family Medicine

## 2022-04-26 VITALS — BP 120/60 | HR 85 | Temp 98.0°F | Ht 61.0 in | Wt 113.2 lb

## 2022-04-26 DIAGNOSIS — J019 Acute sinusitis, unspecified: Secondary | ICD-10-CM

## 2022-04-26 MED ORDER — LEVOCETIRIZINE DIHYDROCHLORIDE 5 MG PO TABS: 5.0000 mg | ORAL_TABLET | Freq: Every evening | ORAL | 1 refills | Status: DC

## 2022-04-26 MED ORDER — AMOXICILLIN 875 MG PO TABS: 875.0000 mg | ORAL_TABLET | Freq: Two times a day (BID) | ORAL | 0 refills | Status: AC

## 2022-04-26 MED ORDER — FLUTICASONE PROPIONATE 50 MCG/ACT NA SUSP: 2.0000 | Freq: Every day | NASAL | 6 refills | Status: DC

## 2022-04-26 NOTE — Progress Notes (Signed)
Subjective:    Patient ID: Brittany Levy, female    DOB: Nov 27, 1934, 88 y.o.   MRN: [id number]  HPI 04/12/22 Patient is a very pleasant 88 year old Caucasian female who presents today with fever, body aches, head congestion, cough, and general malaise.  Fever began yesterday.  Fevers 100.4.  She has not taken a COVID test.  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  She was exposed to a person who had [place].  Shortly thereafter she developed symptoms.  She denies any chest pain or pleurisy or hemoptysis or shortness of breath.  At that time, my plan was: Patient's history and physical exam support a viral upper respiratory infection.  Given her recent exposure to [place] and her fever I suspect that she likely has this as well.  I do not have any rapid COVID test available.  Therefore recommended the patient go home and take a home COVID test.  If positive, I recommended starting Paxlovid 86 tablets twice daily for 5 days along with supportive care.  If negative, I would recommend supportive care only  04/26/22 Patient did wind up having COVID.  She took Paxlovid.  She denies any chest congestion or shortness of breath or chest pain but she does report persistent rhinorrhea and sinus congestion.  She denies any sinus pain or sinus headaches or fevers Past Medical History:  Diagnosis Date   Bilateral carotid artery stenosis    Chest pain    GERD (gastroesophageal reflux disease)    Hypertension    Insomnia    Osteoporosis of femur without pathological fracture    Past Surgical History:  Procedure Laterality Date   ABDOMINAL HYSTERECTOMY     BREAST BIOPSY Right 05/07/2016   FRAGMENTS OF LYMPH NODE   Current Outpatient Medications on File Prior to Visit  Medication Sig Dispense Refill   aspirin 81 MG tablet Take 81 mg by mouth daily.     atorvastatin (LIPITOR) 40 MG tablet TAKE 1 TABLET BY MOUTH ONCE A DAY 90 tablet 1   calcium-vitamin D (OSCAL WITH D) 250-125 MG-UNIT  per tablet Take 1 tablet by mouth 2 (two) times daily.     carvedilol (COREG) 6.25 MG tablet TAKE 1 TABLET BY MOUTH TWICE A DAY WITH A MEAL 90 tablet 1   clonazePAM (KLONOPIN) 0.5 MG tablet TAKE 1 TABLET BY MOUTH TWICE A DAY AS NEEDED FOR ANXIETY 60 tablet 2   cloNIDine (CATAPRES) 0.2 MG tablet TAKE 1 TABLET BY MOUTH 3 TIMES DAILY. STOP ATENOLOL AND CLONIDINE PATCH. 90 tablet 0   felodipine (PLENDIL) 5 MG 24 hr tablet Take 1 tablet (5 mg total) by mouth daily. 90 tablet 0   gabapentin (NEURONTIN) 100 MG capsule Take 1 capsule (100 mg total) by mouth 3 (three) times daily as needed. 90 capsule 3   hydrALAZINE (APRESOLINE) 25 MG tablet TAKE TWO TABLETS BY MOUTH TWICE A DAY 360 tablet 0   hydrochlorothiazide (HYDRODIURIL) 25 MG tablet TAKE 1 TABLET BY MOUTH ONCE DAILY 90 tablet 3   irbesartan (AVAPRO) 300 MG tablet Take 1 tablet (300 mg total) by mouth daily. 90 tablet 0   Omega-3 Fatty Acids (FISH OIL PO) Take 2 capsules by mouth.      omeprazole (PRILOSEC) 40 MG capsule TAKE ONE CAPSULE BY MOUTH ONCE DAILY 90 capsule 3   potassium chloride (KLOR-CON) 10 MEQ tablet TAKE 1 TABLET BY MOUTH ONCE A DAY 90 tablet 3   silver sulfADIAZINE (SILVADENE) 1 % cream Apply 1  Application topically daily. 50 g 0   Current Facility-Administered Medications on File Prior to Visit  Medication Dose Route Frequency Provider Last Rate Last Admin   denosumab (PROLIA) injection 60 mg  60 mg Subcutaneous Once Susy Frizzle, MD       No Known Allergies  Social History   Socioeconomic History   Marital status: Widowed    Spouse name: Not on file   Number of children: 1   Years of education: Not on file   Highest education level: Not on file  Occupational History   Not on file  Tobacco Use   Smoking status: Never   Smokeless tobacco: Never  Substance and Sexual Activity   Alcohol use: Yes    Comment: wine occasionally   Drug use: No   Sexual activity: Not on file  Other Topics Concern   Not on file   Social History Narrative   Husband passed in 2021.   1 daughter.   2 grand children.   Social Determinants of Health   Financial Resource Strain: Low Risk  (02/13/2022)   Overall Financial Resource Strain (CARDIA)    Difficulty of Paying Living Expenses: Not hard at all  Food Insecurity: No Food Insecurity (02/13/2022)   Hunger Vital Sign    Worried About Running Out of Food in the Last Year: Never true    Ran Out of Food in the Last Year: Never true  Transportation Needs: No Transportation Needs (02/13/2022)   PRAPARE - Hydrologist (Medical): No    Lack of Transportation (Non-Medical): No  Physical Activity: Sufficiently Active (02/13/2022)   Exercise Vital Sign    Days of Exercise per Week: 5 days    Minutes of Exercise per Session: 30 min  Stress: No Stress Concern Present (02/13/2022)   [place]    Feeling of Stress : Not at all  Social Connections: [place] (02/13/2022)   Social Connection and Isolation Panel [NHANES]    Frequency of Communication with Friends and Family: More than three times a week    Frequency of Social Gatherings with Friends and Family: More than three times a week    Attends Religious Services: More than 4 times per year    Active Member of [employer] or Organizations: Yes    Attends Music therapist: More than 4 times per year    Marital Status: Married  Human resources officer Violence: Not At Risk (02/13/2022)   Humiliation, Afraid, Rape, and Kick questionnaire    Fear of Current or Ex-Partner: No    Emotionally Abused: No    Physically Abused: No    Sexually Abused: No     Review of Systems     Objective:   Physical Exam Constitutional:      General: She is not in acute distress.    Appearance: Normal appearance. She is normal weight. She is not ill-appearing or toxic-appearing.  HENT:     Right Ear: Tympanic membrane and ear canal  normal.     Left Ear: Tympanic membrane and ear canal normal.     Nose: Congestion and rhinorrhea present.     Mouth/Throat:     Pharynx: No oropharyngeal exudate or posterior oropharyngeal erythema.  Eyes:     Extraocular Movements: Extraocular movements intact.     Conjunctiva/sclera: Conjunctivae normal.     Pupils: Pupils are equal, round, and reactive to light.  Cardiovascular:     Rate  and Rhythm: Normal rate and regular rhythm.     Pulses: Normal pulses.     Heart sounds: Normal heart sounds. No murmur heard.    No friction rub.  Pulmonary:     Effort: Pulmonary effort is normal. No respiratory distress.     Breath sounds: Normal breath sounds. No stridor. No wheezing, rhonchi or rales.  Abdominal:     General: Abdomen is flat. Bowel sounds are normal.     Palpations: Abdomen is soft.  Musculoskeletal:     Right lower leg: Normal. No swelling, deformity, tenderness or bony tenderness.     Left lower leg: Normal. No swelling, deformity, tenderness or bony tenderness.  Neurological:     General: No focal deficit present.     Mental Status: She is alert and oriented to person, place, and time.     Cranial Nerves: No cranial nerve deficit.     Sensory: No sensory deficit.     Motor: No weakness.     Coordination: Coordination normal.     Gait: Gait normal.     Deep Tendon Reflexes: Reflexes normal.          Assessment & Plan:  Subacute sinusitis, unspecified location I believe the patient is dealing with sinusitis.  This could be lingering from the virus or potentially could be due to allergies.  I recommended Flonase 2 sprays each nostril daily coupled with Xyzal 5 mg daily due to her history of hypertension.  Allow 5 days to improve.  If she develops pain or fever, I want her to start taking amoxicillin possible secondary bacterial sinus infection

## 2022-05-13 ENCOUNTER — Other Ambulatory Visit: Payer: Self-pay | Admitting: Family Medicine

## 2022-05-13 DIAGNOSIS — I10 Essential (primary) hypertension: Secondary | ICD-10-CM

## 2022-05-13 DIAGNOSIS — I6523 Occlusion and stenosis of bilateral carotid arteries: Secondary | ICD-10-CM

## 2022-06-07 ENCOUNTER — Encounter: Payer: Self-pay | Admitting: Family Medicine

## 2022-06-07 ENCOUNTER — Ambulatory Visit (INDEPENDENT_AMBULATORY_CARE_PROVIDER_SITE_OTHER): Payer: PPO | Admitting: Family Medicine

## 2022-06-07 VITALS — BP 130/80 | HR 88 | Temp 97.8°F | Ht 61.0 in | Wt 112.8 lb

## 2022-06-07 DIAGNOSIS — L233 Allergic contact dermatitis due to drugs in contact with skin: Secondary | ICD-10-CM | POA: Diagnosis not present

## 2022-06-07 MED ORDER — TRIAMCINOLONE ACETONIDE 0.1 % EX CREA: 1.0000 | TOPICAL_CREAM | Freq: Two times a day (BID) | CUTANEOUS | 0 refills | Status: DC

## 2022-06-07 NOTE — Progress Notes (Signed)
Subjective:    Patient ID: Brittany Levy, female    DOB: [birth year], 88 y.o.   MRN: [id number]  HPI 12/20/21 Patient has a sore on the dorsum of her right foot.  It is in the webspace between her fourth and fifth toes.  Is approximately 8 mm in diameter.  It is a halo of hypopigmented tissue with a central area that is 5 mm in diameter of red granulation tissue.  She states that it will not heal.  She is been putting silvadene daily.    At that time, my plan was: I treated the excessive granulation tissue with silver nitrate cautery.  Continue to apply Polysporin and a Band-Aid daily and allow 1 to 2 weeks to heal.  Biopsy if persistent  06/07/22  Patient states that she has been covering the wound with a bandage and applying Silvadene cream to it every day since November.  (6 months!)  The skin there now is irritated, hypopigmented, with lichenification's and hyperkeratotic scaling.  There is also fine fissures weeping serous fluid.  I believe that she is having a contact dermatitis Past Medical History:  Diagnosis Date   Bilateral carotid artery stenosis    Chest pain    GERD (gastroesophageal reflux disease)    Hypertension    Insomnia    Osteoporosis of femur without pathological fracture    Past Surgical History:  Procedure Laterality Date   ABDOMINAL HYSTERECTOMY     BREAST BIOPSY Right 05/07/2016   FRAGMENTS OF LYMPH NODE   Current Outpatient Medications on File Prior to Visit  Medication Sig Dispense Refill   aspirin 81 MG tablet Take 81 mg by mouth daily.     atorvastatin (LIPITOR) 40 MG tablet TAKE 1 TABLET BY MOUTH ONCE A DAY 90 tablet 1   calcium-vitamin D (OSCAL WITH D) 250-125 MG-UNIT per tablet Take 1 tablet by mouth 2 (two) times daily.     carvedilol (COREG) 6.25 MG tablet TAKE ONE TABLET BY MOUTH TWICE A DAY WITH A MEAL 180 tablet 1   clonazePAM (KLONOPIN) 0.5 MG tablet TAKE 1 TABLET BY MOUTH TWICE A DAY AS NEEDED FOR ANXIETY 60 tablet 2   cloNIDine (CATAPRES)  0.2 MG tablet TAKE 1 TABLET BY MOUTH 3 TIMES DAILY. STOP ATENOLOL AND CLONIDINE PATCH. 90 tablet 0   felodipine (PLENDIL) 5 MG 24 hr tablet Take 1 tablet (5 mg total) by mouth daily. 90 tablet 0   fluticasone (FLONASE) 50 MCG/ACT nasal spray Place 2 sprays into both nostrils daily. 16 g 6   gabapentin (NEURONTIN) 100 MG capsule Take 1 capsule (100 mg total) by mouth 3 (three) times daily as needed. 90 capsule 3   hydrALAZINE (APRESOLINE) 25 MG tablet TAKE TWO TABLETS BY MOUTH TWICE A DAY 360 tablet 0   hydrochlorothiazide (HYDRODIURIL) 25 MG tablet TAKE 1 TABLET BY MOUTH ONCE DAILY 90 tablet 3   irbesartan (AVAPRO) 300 MG tablet Take 1 tablet (300 mg total) by mouth daily. 90 tablet 0   Omega-3 Fatty Acids (FISH OIL PO) Take 2 capsules by mouth.      potassium chloride (KLOR-CON) 10 MEQ tablet TAKE ONE TABLET BY MOUTH ONCE A DAY 90 tablet 3   silver sulfADIAZINE (SILVADENE) 1 % cream Apply 1 Application topically daily. 50 g 0   Current Facility-Administered Medications on File Prior to Visit  Medication Dose Route Frequency Provider Last Rate Last Admin   denosumab (PROLIA) injection 60 mg  60 mg Subcutaneous Once Lynnea Ferrier T,  MD       No Known Allergies  Social History   Socioeconomic History   Marital status: Widowed    Spouse name: Not on file   Number of children: 1   Years of education: Not on file   Highest education level: Not on file  Occupational History   Not on file  Tobacco Use   Smoking status: Never   Smokeless tobacco: Never  Substance and Sexual Activity   Alcohol use: Yes    Comment: wine occasionally   Drug use: No   Sexual activity: Not on file  Other Topics Concern   Not on file  Social History Narrative   Husband passed in 2021.   1 daughter.   2 grand children.   Social Determinants of Health   Financial Resource Strain: Low Risk  (02/13/2022)   Overall Financial Resource Strain (CARDIA)    Difficulty of Paying Living Expenses: Not hard at all   Food Insecurity: No Food Insecurity (02/13/2022)   Hunger Vital Sign    Worried About Running Out of Food in the Last Year: Never true    Ran Out of Food in the Last Year: Never true  Transportation Needs: No Transportation Needs (02/13/2022)   PRAPARE - Administrator, Civil Service (Medical): No    Lack of Transportation (Non-Medical): No  Physical Activity: Sufficiently Active (02/13/2022)   Exercise Vital Sign    Days of Exercise per Week: 5 days    Minutes of Exercise per Session: 30 min  Stress: No Stress Concern Present (02/13/2022)   [employer] of Occupational Health - Occupational Stress Questionnaire    Feeling of Stress : Not at all  Social Connections: [place]ially [place] (02/13/2022)   Social Connection and Isolation Panel [NHANES]    Frequency of Communication with Friends and Family: More than three times a week    Frequency of Social Gatherings with Friends and Family: More than three times a week    Attends Religious Services: More than 4 times per year    Active Member of [employer] or Organizations: Yes    Attends Engineer, structural: More than 4 times per year    Marital Status: Married  Catering manager Violence: Not At Risk (02/13/2022)   Humiliation, Afraid, Rape, and Kick questionnaire    Fear of Current or Ex-Partner: No    Emotionally Abused: No    Physically Abused: No    Sexually Abused: No     Review of Systems     Objective:   Physical Exam Constitutional:      General: She is not in acute distress.    Appearance: Normal appearance. She is normal weight. She is not ill-appearing or toxic-appearing.  Cardiovascular:     Rate and Rhythm: Normal rate and regular rhythm.     Pulses: Normal pulses.     Heart sounds: Normal heart sounds. No murmur heard.    No friction rub.  Pulmonary:     Effort: Pulmonary effort is normal. No respiratory distress.     Breath sounds: Normal breath sounds. No stridor. No wheezing, rhonchi or  rales.  Musculoskeletal:        General: No swelling, tenderness, deformity or signs of injury.     Right lower leg: Normal. No swelling, deformity, tenderness or bony tenderness. No edema.     Left lower leg: Normal. No swelling, deformity, tenderness or bony tenderness. No edema.       Feet:  Skin:  Findings: Lesion present.  Neurological:     Mental Status: She is alert.          Assessment & Plan:  Allergic contact dermatitis due to drugs in contact with skin Discontinue Silvadene immediately.  Begin using triamcinolone cream twice daily for 7 to 14 days.  Recheck if not better.  Discontinue triamcinolone after 14 days

## 2022-06-14 ENCOUNTER — Other Ambulatory Visit: Payer: Self-pay | Admitting: Family Medicine

## 2022-06-14 NOTE — Telephone Encounter (Signed)
Requested Prescriptions  Pending Prescriptions Disp Refills   cloNIDine (CATAPRES) 0.2 MG tablet [Pharmacy Med Name: CLONIDINE HYDROCHLORIDE 0.2MG  TABLET] 270 tablet 0    Sig: TAKE ONE TABLET BY MOUTH THREE TIMES DAILY. STOP ATENOLOL AND CLONIDINE PATCH.     Cardiovascular:  Alpha-2 Agonists Failed - 06/14/2022 10:19 AM      Failed - Valid encounter within last 6 months    Recent Outpatient Visits           1 year ago GAD (generalized anxiety disorder)   [facility] Pickard, Priscille Heidelberg, MD   1 year ago Neuropathy   [facility] Tanya Nones, Priscille Heidelberg, MD   2 years ago Essential hypertension   [facility] Tanya Nones, Priscille Heidelberg, MD   2 years ago Tick bite, initial encounter   [facility] Tanya Nones, Priscille Heidelberg, MD   2 years ago Rhinosinusitis   [facility] Pickard, Priscille Heidelberg, MD       Future Appointments             In 5 months Pickard, Priscille Heidelberg, MD [facility]            Passed - Last BP in normal range    BP Readings from Last 1 Encounters:  06/07/22 130/80         Passed - Last Heart Rate in normal range    Pulse Readings from Last 1 Encounters:  06/07/22 88

## 2022-07-02 ENCOUNTER — Other Ambulatory Visit: Payer: Self-pay | Admitting: Family Medicine

## 2022-07-08 ENCOUNTER — Other Ambulatory Visit: Payer: Self-pay | Admitting: Family Medicine

## 2022-07-24 ENCOUNTER — Other Ambulatory Visit: Payer: Self-pay

## 2022-07-24 DIAGNOSIS — M810 Age-related osteoporosis without current pathological fracture: Secondary | ICD-10-CM

## 2022-07-26 ENCOUNTER — Telehealth: Payer: Self-pay

## 2022-07-26 NOTE — Progress Notes (Signed)
   Care Guide Note  07/26/2022 Name: Brittany Levy MRN: [id number] DOB: 07/14/1934  Referred by: Donita Brooks, MD Reason for referral : Care Coordination (Outreach to schedule referral with Pharm d )   Brittany Levy is a 88 y.o. year old female who is a primary care patient of Tanya Nones, Priscille Heidelberg, MD. Brittany Levy was referred to the pharmacist for assistance related to  osteoperosis .    An unsuccessful telephone outreach was attempted today to contact the patient who was referred to the pharmacy team for assistance with medication management. Additional attempts will be made to contact the patient.   Penne Lash, RMA Care Guide [facility]  [place], [place] [address] Direct Dial: [phone] [email or web address]

## 2022-07-31 NOTE — Progress Notes (Signed)
   Care Guide Note  07/31/2022 Name: STEPHANNY TSUTSUI MRN: [id number] DOB: August 23, 1934  Referred by: Donita Brooks, MD Reason for referral : Care Coordination (Outreach to schedule referral with Pharm d )   Brittany Levy is a 88 y.o. year old female who is a primary care patient of Tanya Nones, Priscille Heidelberg, MD. Brittany Levy was referred to the pharmacist for assistance related to  osteoperosis .    A second unsuccessful telephone outreach was attempted today to contact the patient who was referred to the pharmacy team for assistance with medication management. Additional attempts will be made to contact the patient.  Brittany Levy, RMA Care Guide [facility]  [place], [place] [address] Direct Dial: [phone] [email or web address]

## 2022-08-05 NOTE — Progress Notes (Signed)
   Care Guide Note  08/05/2022 Name: YULIE VERGA MRN: [id number] DOB: 1934/09/06  Referred by: Donita Brooks, MD Reason for referral : Care Coordination (Outreach to schedule referral with Pharm d )   CHIYEKO KEMPA is a 88 y.o. year old female who is a primary care patient of Tanya Nones, Priscille Heidelberg, MD. Kathe Mariner was referred to the pharmacist for assistance related to  osteoperosis .    Successful contact was made with the patient to discuss pharmacy services including being ready for the pharmacist to call at least 5 minutes before the scheduled appointment time, to have medication bottles and any blood sugar or blood pressure readings ready for review. The patient agreed to meet with the pharmacist via with the pharmacist via telephone visit on (date/time).  08/21/2022   Penne Lash, RMA Care Guide [facility]  [place], [place] [address] Direct Dial: [phone] [email or web address]

## 2022-08-12 ENCOUNTER — Other Ambulatory Visit: Payer: Self-pay | Admitting: Family Medicine

## 2022-08-21 ENCOUNTER — Other Ambulatory Visit: Payer: PPO | Admitting: Pharmacist

## 2022-08-21 NOTE — Progress Notes (Signed)
08/21/2022 Name: Brittany Levy MRN: [id number] DOB: 07-Apr-1934  Osteoporosis (Prolia)   Brittany Levy is a 88 y.o. year old female who presented for a telephone visit.   Patient was referred to the pharmacist by their PCP/PCP RN for assistance in managing  prolia access .   Patient has been stable on Prolia (since 2018) & calcium/vitamin D supplements to management her osteoporosis.  Upon chart review, she has a history of a broken bone from a minor injury & vertebral compression fractures. There appears to be a family history of broken hip.  Her last DEXA was 07/06/2020 which showed improvement from 2018 DEXA (osteporosis is now "low bone mass"/osteopenia) although her fracture risk does increase with age.  Subjective:  Care Team: Primary Care Provider: Donita Brooks, MD  Medication Access/Adherence  Current Pharmacy:  [facility] - [place], [place] - [address] [place] [address] Phone: [phone] Fax: [phone]   Osteoporosis:  Current medications:  Prolia (last injection given 01/2022) Medications tried in the past: alendronate recommended back in 2018, but patient never took based on PCP (fearful of medication after reading about it) Lowest T-score of femur was -2.7 in 2018 History of compression fracture in spine Last dexa 2022 Denies falls; denies side effects  Current supplements: calcium & vitamin D  Current physical activity: encouraged as able  Most recent DEXA (07/06/2020):   AP LUMBAR SPINE L1 through L4 Bone Mineral Density (BMD):  1.058 g/cm2 Young Adult T-Score:  0.1 Z-Score:  2.9   LEFT FEMUR NECK Bone Mineral Density (BMD):  0.591 g/cm2 Young Adult T-Score: -2.3 Z-Score:  0.2   LEFT forearm 1/3 Bone Mineral Density (BMD):  0.625 g/cm2 Young Adult T-Score: -1.2  Z-Score:  2.5  Current medication access support: n/a  Objective:  Lab Results  Component Value Date   CREATININE 0.70 09/04/2021    BUN 11 09/04/2021   NA 136 09/04/2021   K 4.2 09/04/2021   CL 96 (L) 09/04/2021   CO2 31 09/04/2021    Lab Results  Component Value Date   CHOL 141 05/10/2021   HDL 72 05/10/2021   LDLCALC 54 05/10/2021   TRIG 71 05/10/2021   CHOLHDL 2.0 05/10/2021    Medications Reviewed Today     Reviewed by Brittany Dash, LPN (Licensed Practical Nurse) on 06/07/22 at [phone]  Med List Status: <None>   Medication Order Taking? Sig Documenting Provider Last Dose Status Informant  aspirin 81 MG tablet [id number] Yes Take 81 mg by mouth daily. [provider] Taking Active   atorvastatin (LIPITOR) 40 MG tablet [id number] Yes TAKE 1 TABLET BY MOUTH ONCE A DAY Levy, Brittany Heidelberg, MD Taking Active   calcium-vitamin D (OSCAL WITH D) 250-125 MG-UNIT per tablet [id number] Yes Take 1 tablet by mouth 2 (two) times daily. [provider] Taking Active   carvedilol (COREG) 6.25 MG tablet [id number] Yes TAKE ONE TABLET BY MOUTH TWICE A DAY WITH A MEAL Brittany Brooks, MD Taking Active   clonazePAM (KLONOPIN) 0.5 MG tablet [id number] Yes TAKE 1 TABLET BY MOUTH TWICE A DAY AS NEEDED FOR ANXIETY Brittany Brooks, MD Taking Active   cloNIDine (CATAPRES) 0.2 MG tablet [id number] Yes TAKE 1 TABLET BY MOUTH 3 TIMES DAILY. STOP ATENOLOL AND CLONIDINE PATCH. Brittany Brooks, MD Taking Active   denosumab [facility]) injection 60 mg [id number]   Brittany Brooks, MD  Active   felodipine (PLENDIL) 5 MG 24 hr tablet [id number] Yes  Take 1 tablet (5 mg total) by mouth daily. Brittany Brooks, MD Taking Active   fluticasone [facility]) 50 MCG/ACT nasal spray [id number] Yes Place 2 sprays into both nostrils daily. Brittany Brooks, MD Taking Active   gabapentin (NEURONTIN) 100 MG capsule [id number] Yes Take 1 capsule (100 mg total) by mouth 3 (three) times daily as needed. Brittany Brooks, MD Taking Active   hydrALAZINE (APRESOLINE) 25 MG tablet [id number] Yes TAKE TWO TABLETS BY MOUTH TWICE A DAY Brittany Brooks, MD Taking Active   hydrochlorothiazide (HYDRODIURIL) 25 MG tablet [id number] Yes TAKE 1 TABLET BY MOUTH ONCE DAILY Brittany Brooks, MD Taking Active   irbesartan (AVAPRO) 300 MG tablet [id number] Yes Take 1 tablet (300 mg total) by mouth daily. Brittany Brooks, MD Taking Active   levocetirizine Elita Boone ALLERGY 24HR) 5 MG tablet [id number] Yes Take 1 tablet (5 mg total) by mouth every evening. Brittany Brooks, MD Taking Active   Omega-3 Fatty Acids (FISH OIL PO) [id number] Yes Take 2 capsules by mouth.  [provider] Taking Active   omeprazole (PRILOSEC) 40 MG capsule [id number] Yes TAKE ONE CAPSULE BY MOUTH ONCE DAILY Brittany Brooks, MD Taking Active   potassium chloride (KLOR-CON) 10 MEQ tablet [id number] Yes TAKE ONE TABLET BY MOUTH ONCE A DAY Brittany Brooks, MD Taking Active   silver sulfADIAZINE (SILVADENE) 1 % cream [id number] Yes Apply 1 Application topically daily. Brittany Brooks, MD Taking Active               Assessment/Plan:   Osteoporosis: - Currently appropriately managed--DEXA shows that patient has reversed from Osteoporosis to Low bone mass (previously osteopenia)  ASSESSMENT: Patient's diagnostic category is LOW BONE MASS/OSTEOPENIA by [facility] Criteria.   FRACTURE RISK: INCREASED (as with age)  FRAX: Based on the World Health Organization FRAX model, the 10 year probability of a major osteoporotic fracture is 15%. The 10 year probability of a hip fracture is 5.2%. - Recommend to continue Prolia --message routed to PCP RN to assist with insurance approval and drug retrieval  RN to schedule patient for in-clinic Prolia administration  Her prolia was due on 07/2022, but will be appropriate to continue on therapy - Reviewed recommendation for daily calcium intake of 1200 mg and vitamin D intake of [phone] units - Recommended to choose calcium citrate formulation due to concurrent acid reflux medication - Reviewed benefits of weight bearing exercise -  Recommend vitamin D level; GFR stable at 85  Follow Up Plan: no further follow up by PharmD; PCP to follow Prolia   Brittany Levy, PharmD, BCACP Clinical Pharmacist, [facility]

## 2022-08-26 ENCOUNTER — Other Ambulatory Visit: Payer: Self-pay

## 2022-08-26 DIAGNOSIS — M810 Age-related osteoporosis without current pathological fracture: Secondary | ICD-10-CM

## 2022-09-03 ENCOUNTER — Other Ambulatory Visit: Payer: Self-pay | Admitting: Family Medicine

## 2022-09-03 ENCOUNTER — Encounter: Payer: Self-pay | Admitting: Family Medicine

## 2022-09-03 MED ORDER — GABAPENTIN 100 MG PO CAPS: 100.0000 mg | ORAL_CAPSULE | Freq: Three times a day (TID) | ORAL | 3 refills | Status: DC | PRN

## 2022-09-05 DIAGNOSIS — H903 Sensorineural hearing loss, bilateral: Secondary | ICD-10-CM | POA: Diagnosis not present

## 2022-09-23 ENCOUNTER — Other Ambulatory Visit: Payer: Self-pay | Admitting: Family Medicine

## 2022-09-23 DIAGNOSIS — H903 Sensorineural hearing loss, bilateral: Secondary | ICD-10-CM | POA: Diagnosis not present

## 2022-09-23 DIAGNOSIS — H6123 Impacted cerumen, bilateral: Secondary | ICD-10-CM | POA: Diagnosis not present

## 2022-09-24 NOTE — Telephone Encounter (Signed)
Requested medication (s) are due for refill today - yes  Requested medication (s) are on the active medication list -yes  Future visit scheduled -yes  Last refill: 02/28/22 #60 2RF  Notes to clinic: non delegated Rx  Requested Prescriptions  Pending Prescriptions Disp Refills   clonazePAM (KLONOPIN) 0.5 MG tablet [Pharmacy Med Name: CLONAZEPAM 0.5MG  TABLET] 60 tablet 2    Sig: TAKE ONE TABLET BY MOUTH TWICE A DAY AS NEEDED FOR ANXIETY     Not Delegated - Psychiatry: Anxiolytics/Hypnotics 2 Failed - 09/23/2022 11:18 AM      Failed - This refill cannot be delegated      Failed - Urine Drug Screen completed in last 360 days      Failed - Valid encounter within last 6 months    Recent Outpatient Visits           1 year ago GAD (generalized anxiety disorder)   [facility] Pickard, Priscille Heidelberg, MD   2 years ago Neuropathy   [facility] Pickard, Priscille Heidelberg, MD   2 years ago Essential hypertension   [facility] Tanya Nones, Priscille Heidelberg, MD   2 years ago Tick bite, initial encounter   [facility] Tanya Nones, Priscille Heidelberg, MD   3 years ago Rhinosinusitis   [facility] Pickard, Priscille Heidelberg, MD       Future Appointments             In 2 months Pickard, Priscille Heidelberg, MD [facility], [facility]            Passed - Patient is not pregnant         Requested Prescriptions  Pending Prescriptions Disp Refills   clonazePAM (KLONOPIN) 0.5 MG tablet [Pharmacy Med Name: CLONAZEPAM 0.5MG  TABLET] 60 tablet 2    Sig: TAKE ONE TABLET BY MOUTH TWICE A DAY AS NEEDED FOR ANXIETY     Not Delegated - Psychiatry: Anxiolytics/Hypnotics 2 Failed - 09/23/2022 11:18 AM      Failed - This refill cannot be delegated      Failed - Urine Drug Screen completed in last 360 days      Failed - Valid encounter within last 6 months    Recent Outpatient Visits           1 year ago GAD (generalized anxiety disorder)    [facility] Donita Brooks, MD   2 years ago Neuropathy   [facility] Tanya Nones, Priscille Heidelberg, MD   2 years ago Essential hypertension   [facility] Tanya Nones, Priscille Heidelberg, MD   2 years ago Tick bite, initial encounter   [facility] Donita Brooks, MD   3 years ago Rhinosinusitis   [facility] Pickard, Priscille Heidelberg, MD       Future Appointments             In 2 months Pickard, Priscille Heidelberg, MD [facility], [facility]            Passed - Patient is not pregnant

## 2022-10-21 ENCOUNTER — Other Ambulatory Visit: Payer: Self-pay | Admitting: Family Medicine

## 2022-10-22 NOTE — Telephone Encounter (Signed)
Request is too soon for refill.Last refill 07/08/22 for 90 and 1 refill.  Requested Prescriptions  Pending Prescriptions Disp Refills   felodipine (PLENDIL) 5 MG 24 hr tablet [Pharmacy Med Name: FELODIPINE ER 5MG  ER TABLET ER 24HR] 90 tablet 1    Sig: TAKE ONE TABLET (5 MG TOTAL) BY MOUTH DAILY.     Cardiovascular: Calcium Channel Blockers 2 Failed - 10/21/2022 11:25 AM      Failed - Valid encounter within last 6 months    Recent Outpatient Visits           1 year ago GAD (generalized anxiety disorder)   [facility] Pickard, Priscille Heidelberg, MD   2 years ago Neuropathy   [facility] Tanya Nones, Priscille Heidelberg, MD   2 years ago Essential hypertension   [facility] [place] [facility] Tanya Nones, Priscille Heidelberg, MD   3 years ago Tick bite, initial encounter   [facility] Pickard, Priscille Heidelberg, MD   3 years ago Rhinosinusitis   [facility] Pickard, Priscille Heidelberg, MD       Future Appointments             In 1 month Pickard, Priscille Heidelberg, MD [facility], [facility]            Passed - Last BP in normal range    BP Readings from Last 1 Encounters:  06/07/22 130/80         Passed - Last Heart Rate in normal range    Pulse Readings from Last 1 Encounters:  06/07/22 88

## 2022-10-29 ENCOUNTER — Other Ambulatory Visit: Payer: Self-pay | Admitting: Family Medicine

## 2022-10-30 NOTE — Telephone Encounter (Signed)
Requested medication (s) are due for refill today: yes  Requested medication (s) are on the active medication list: yes  Last refill:  10/29/21 #90 3 RF  Future visit scheduled:yes  Notes to clinic:  overdue lab work    Requested Prescriptions  Pending Prescriptions Disp Refills   hydrochlorothiazide (HYDRODIURIL) 25 MG tablet [Pharmacy Med Name: HYDROCHLOROTHIAZIDE 25MG  TABLET] 90 tablet 3    Sig: TAKE ONE TABLET BY MOUTH ONCE DAILY     Cardiovascular: Diuretics - Thiazide Failed - 10/29/2022 10:40 AM      Failed - Cr in normal range and within 180 days    Creat  Date Value Ref Range Status  09/04/2021 0.70 0.60 - 0.95 mg/dL Final         Failed - K in normal range and within 180 days    Potassium  Date Value Ref Range Status  09/04/2021 4.2 3.5 - 5.3 mmol/L Final         Failed - Na in normal range and within 180 days    Sodium  Date Value Ref Range Status  09/04/2021 136 135 - 146 mmol/L Final         Failed - Valid encounter within last 6 months    Recent Outpatient Visits           1 year ago GAD (generalized anxiety disorder)   [facility] Donita Brooks, MD   2 years ago Neuropathy   [facility] Donita Brooks, MD   2 years ago Essential hypertension   [facility] Tanya Nones, Priscille Heidelberg, MD   3 years ago Tick bite, initial encounter   [facility] Tanya Nones, Priscille Heidelberg, MD   3 years ago Rhinosinusitis   [facility] Pickard, Priscille Heidelberg, MD       Future Appointments             In 1 month Pickard, Priscille Heidelberg, MD [facility], [facility]            Passed - Last BP in normal range    BP Readings from Last 1 Encounters:  06/07/22 130/80

## 2022-11-04 ENCOUNTER — Other Ambulatory Visit: Payer: Self-pay | Admitting: Family Medicine

## 2022-11-05 NOTE — Telephone Encounter (Signed)
Requested medications are due for refill today.  yes  Requested medications are on the active medications list.  yes  Last refill. 10/29/2021 #90 3 rf  Future visit scheduled.   yes  Notes to clinic.  Labs are expired.    Requested Prescriptions  Pending Prescriptions Disp Refills   hydrochlorothiazide (HYDRODIURIL) 25 MG tablet [Pharmacy Med Name: HYDROCHLOROTHIAZIDE 25MG  TABLET] 90 tablet 0    Sig: TAKE ONE TABLET BY MOUTH ONCE DAILY (EMERGENCY FILL)     Cardiovascular: Diuretics - Thiazide Failed - 11/04/2022  1:48 PM      Failed - Cr in normal range and within 180 days    Creat  Date Value Ref Range Status  09/04/2021 0.70 0.60 - 0.95 mg/dL Final         Failed - K in normal range and within 180 days    Potassium  Date Value Ref Range Status  09/04/2021 4.2 3.5 - 5.3 mmol/L Final         Failed - Na in normal range and within 180 days    Sodium  Date Value Ref Range Status  09/04/2021 136 135 - 146 mmol/L Final         Failed - Valid encounter within last 6 months    Recent Outpatient Visits           1 year ago GAD (generalized anxiety disorder)   [facility] Donita Brooks, MD   2 years ago Neuropathy   [facility] Donita Brooks, MD   2 years ago Essential hypertension   [facility] Tanya Nones, Priscille Heidelberg, MD   3 years ago Tick bite, initial encounter   [facility] Tanya Nones, Priscille Heidelberg, MD   3 years ago Rhinosinusitis   [facility] Pickard, Priscille Heidelberg, MD       Future Appointments             In 1 month Pickard, Priscille Heidelberg, MD [facility]            Passed - Last BP in normal range    BP Readings from Last 1 Encounters:  06/07/22 130/80

## 2022-11-11 ENCOUNTER — Other Ambulatory Visit: Payer: Self-pay | Admitting: Family Medicine

## 2022-11-12 NOTE — Telephone Encounter (Signed)
Requested medication (s) are due for refill today: yes  Requested medication (s) are on the active medication list: yes  Last refill:  08/12/22 #360  Future visit scheduled: yes  Notes to clinic:  overdue lab work    Requested Prescriptions  Pending Prescriptions Disp Refills   hydrALAZINE (APRESOLINE) 25 MG tablet [Pharmacy Med Name: HYDRALAZINE HYDROCHLORIDE 25MG  TABLET] 360 tablet 0    Sig: TAKE TWO TABLETS BY MOUTH TWICE A DAY     Cardiovascular:  Vasodilators Failed - 11/11/2022 10:18 AM      Failed - HCT in normal range and within 360 days    HCT  Date Value Ref Range Status  09/04/2021 37.2 35.0 - 45.0 % Final         Failed - HGB in normal range and within 360 days    Hemoglobin  Date Value Ref Range Status  09/04/2021 12.4 11.7 - 15.5 g/dL Final         Failed - RBC in normal range and within 360 days    RBC  Date Value Ref Range Status  09/04/2021 4.27 3.80 - 5.10 Million/uL Final         Failed - WBC in normal range and within 360 days    WBC  Date Value Ref Range Status  09/04/2021 6.7 3.8 - 10.8 Thousand/uL Final         Failed - PLT in normal range and within 360 days    Platelets  Date Value Ref Range Status  09/04/2021 201 140 - 400 Thousand/uL Final         Failed - ANA Screen, Ifa, Serum in normal range and within 360 days    No results found for: "ANA", "ANATITER", "LABANTI"       Failed - Valid encounter within last 12 months    Recent Outpatient Visits           1 year ago GAD (generalized anxiety disorder)   [facility] Donita Brooks, MD   2 years ago Neuropathy   [facility] Donita Brooks, MD   2 years ago Essential hypertension   [facility] Donita Brooks, MD   3 years ago Tick bite, initial encounter   [facility] Tanya Nones, Priscille Heidelberg, MD   3 years ago Rhinosinusitis   [facility] Pickard, Priscille Heidelberg, MD       Future Appointments              In 3 weeks Pickard, Priscille Heidelberg, MD [facility], [facility]            Passed - Last BP in normal range    BP Readings from Last 1 Encounters:  06/07/22 130/80

## 2022-11-20 ENCOUNTER — Other Ambulatory Visit: Payer: Self-pay

## 2022-11-20 ENCOUNTER — Telehealth: Payer: Self-pay

## 2022-11-20 DIAGNOSIS — I10 Essential (primary) hypertension: Secondary | ICD-10-CM

## 2022-11-20 MED ORDER — HYDRALAZINE HCL 25 MG PO TABS
50.0000 mg | ORAL_TABLET | Freq: Two times a day (BID) | ORAL | 1 refills | Status: DC
Start: 2022-11-20 — End: 2023-05-16

## 2022-11-20 NOTE — Telephone Encounter (Signed)
Pt called in to check on status of refill of this med: Pt states that she only has 2 pills left of this med  hydrALAZINE (APRESOLINE) 25 MG tablet   LOV: 06/07/22  PHARMACY: [facility] - [place], [place] - [address] [place], [place] [address] Phone: [phone]  Fax: [phone]    CB#: [phone]

## 2022-12-09 ENCOUNTER — Ambulatory Visit (INDEPENDENT_AMBULATORY_CARE_PROVIDER_SITE_OTHER): Payer: PPO | Admitting: Family Medicine

## 2022-12-09 VITALS — BP 120/68 | HR 70 | Temp 98.4°F | Ht 61.0 in | Wt 109.0 lb

## 2022-12-09 DIAGNOSIS — I6523 Occlusion and stenosis of bilateral carotid arteries: Secondary | ICD-10-CM | POA: Diagnosis not present

## 2022-12-09 DIAGNOSIS — R739 Hyperglycemia, unspecified: Secondary | ICD-10-CM | POA: Diagnosis not present

## 2022-12-09 DIAGNOSIS — E782 Mixed hyperlipidemia: Secondary | ICD-10-CM

## 2022-12-09 DIAGNOSIS — I10 Essential (primary) hypertension: Secondary | ICD-10-CM | POA: Diagnosis not present

## 2022-12-09 NOTE — Progress Notes (Signed)
Subjective:    Patient ID: Brittany Levy, female    DOB: September 05, 1934, 88 y.o.   MRN: [id number]  HPI Patient is a very pleasant 88 year old Caucasian female with a longstanding history of hypertension who presents today for follow up of her chronic medical problems: Primary hypertension - Plan: CBC with Differential/Platelet, COMPLETE METABOLIC PANEL WITH GFR, Lipid panel  Mixed hyperlipidemia  Bilateral carotid artery stenosis Last year had carotid dopplers that revealed: IMPRESSION: 1. Moderate (50-69%) stenosis proximal right internal carotid artery secondary to heterogenous atherosclerotic plaque. 2. Moderate (50-69%) stenosis proximal left internal carotid artery secondary to heterogenous atherosclerotic plaque. 3. Vertebral arteries are patent with normal antegrade flow.   Overall the patient is doing very well.  Her blood pressure is outstanding at 120/68.  She takes an aspirin every day due to her history of bilateral carotid artery stenosis.  She is due for second shingles shot.  She is also due for her flu shot.  She would like to defer these until after tomorrow because she is working at [employer].  Otherwise she is doing well.  She does occasionally have clear rhinorrhea from her left nostril.  On exam there is a small polyp but otherwise normal.  She is not taking any Flonase and I suggested that she try this.  She denies any chest pain, shortness of breath, dyspnea on exertion.  Past Medical History:  Diagnosis Date   Bilateral carotid artery stenosis    Chest pain    GERD (gastroesophageal reflux disease)    Hypertension    Insomnia    Osteoporosis of femur without pathological fracture    Past Surgical History:  Procedure Laterality Date   ABDOMINAL HYSTERECTOMY     BREAST BIOPSY Right 05/07/2016   FRAGMENTS OF LYMPH NODE   Current Outpatient Medications on File Prior to Visit  Medication Sig Dispense Refill   aspirin 81 MG tablet Take 81 mg by mouth  daily.     atorvastatin (LIPITOR) 40 MG tablet TAKE ONE TABLET BY MOUTH ONCE A DAY 90 tablet 1   calcium-vitamin D (OSCAL WITH D) 250-125 MG-UNIT per tablet Take 1 tablet by mouth 2 (two) times daily.     carvedilol (COREG) 6.25 MG tablet TAKE ONE TABLET BY MOUTH TWICE A DAY WITH A MEAL 180 tablet 1   clonazePAM (KLONOPIN) 0.5 MG tablet TAKE ONE TABLET BY MOUTH TWICE A DAY AS NEEDED FOR ANXIETY 60 tablet 2   cloNIDine (CATAPRES) 0.2 MG tablet TAKE ONE TABLET BY MOUTH THREE TIMES DAILY. STOP ATENOLOL AND CLONIDINE PATCH. 270 tablet 0   felodipine (PLENDIL) 5 MG 24 hr tablet TAKE ONE TABLET (5 MG TOTAL) BY MOUTH DAILY. 90 tablet 1   fluticasone (FLONASE) 50 MCG/ACT nasal spray Place 2 sprays into both nostrils daily. 16 g 6   gabapentin (NEURONTIN) 100 MG capsule Take 1 capsule (100 mg total) by mouth 3 (three) times daily as needed. 90 capsule 3   hydrALAZINE (APRESOLINE) 25 MG tablet Take 2 tablets (50 mg total) by mouth 2 (two) times daily. 360 tablet 1   hydrochlorothiazide (HYDRODIURIL) 25 MG tablet TAKE 1 TABLET BY MOUTH ONCE DAILY 90 tablet 3   irbesartan (AVAPRO) 300 MG tablet TAKE ONE TABLET (300 MG TOTAL) BY MOUTH DAILY. 90 tablet 1   omeprazole (PRILOSEC) 40 MG capsule Take 40 mg by mouth daily.     potassium chloride (KLOR-CON) 10 MEQ tablet TAKE ONE TABLET BY MOUTH ONCE A DAY 90 tablet 3  Current Facility-Administered Medications on File Prior to Visit  Medication Dose Route Frequency Provider Last Rate Last Admin   denosumab (PROLIA) injection 60 mg  60 mg Subcutaneous Once Donita Brooks, MD         No Known Allergies  Social History   Socioeconomic History   Marital status: Widowed    Spouse name: Not on file   Number of children: 1   Years of education: Not on file   Highest education level: Not on file  Occupational History   Not on file  Tobacco Use   Smoking status: Never   Smokeless tobacco: Never  Substance and Sexual Activity   Alcohol use: Yes     Comment: wine occasionally   Drug use: No   Sexual activity: Not on file  Other Topics Concern   Not on file  Social History Narrative   Husband passed in 2021.   1 daughter.   2 grand children.   Social Determinants of Health   Financial Resource Strain: Low Risk  (02/13/2022)   Overall Financial Resource Strain (CARDIA)    Difficulty of Paying Living Expenses: Not hard at all  Food Insecurity: No Food Insecurity (02/13/2022)   Hunger Vital Sign    Worried About Running Out of Food in the Last Year: Never true    Ran Out of Food in the Last Year: Never true  Transportation Needs: No Transportation Needs (02/13/2022)   PRAPARE - Administrator, Civil Service (Medical): No    Lack of Transportation (Non-Medical): No  Physical Activity: Sufficiently Active (02/13/2022)   Exercise Vital Sign    Days of Exercise per Week: 5 days    Minutes of Exercise per Session: 30 min  Stress: No Stress Concern Present (02/13/2022)   [employer] of Occupational Health - Occupational Stress Questionnaire    Feeling of Stress : Not at all  Social Connections: Socially Integrated (02/13/2022)   Social Connection and Isolation Panel [NHANES]    Frequency of Communication with Friends and Family: More than three times a week    Frequency of Social Gatherings with Friends and Family: More than three times a week    Attends Religious Services: More than 4 times per year    Active Member of [employer] or Organizations: Yes    Attends Engineer, structural: More than 4 times per year    Marital Status: Married  Catering manager Violence: Not At Risk (02/13/2022)   Humiliation, Afraid, Rape, and Kick questionnaire    Fear of Current or Ex-Partner: No    Emotionally Abused: No    Physically Abused: No    Sexually Abused: No     Review of Systems     Objective:   Physical Exam Constitutional:      General: She is not in acute distress.    Appearance: Normal appearance. She is  normal weight. She is not ill-appearing or toxic-appearing.  Cardiovascular:     Rate and Rhythm: Normal rate and regular rhythm.     Pulses: Normal pulses.     Heart sounds: Normal heart sounds. No murmur heard.    No friction rub.  Pulmonary:     Effort: Pulmonary effort is normal. No respiratory distress.     Breath sounds: Normal breath sounds. No stridor. No wheezing, rhonchi or rales.  Musculoskeletal:        General: No swelling, tenderness, deformity or signs of injury.     Right lower leg: Normal. No  swelling, deformity, tenderness or bony tenderness. No edema.     Left lower leg: Normal. No swelling, deformity, tenderness or bony tenderness. No edema.  Neurological:     General: No focal deficit present.     Mental Status: She is alert and oriented to person, place, and time.     Cranial Nerves: No cranial nerve deficit.     Sensory: No sensory deficit.     Motor: No weakness.     Coordination: Coordination normal.     Gait: Gait normal.     Deep Tendon Reflexes: Reflexes normal.          Assessment & Plan:  Primary hypertension - Plan: CBC with Differential/Platelet, COMPLETE METABOLIC PANEL WITH GFR, Lipid panel  Mixed hyperlipidemia  Bilateral carotid artery stenosis - Plan: US Carotid Duplex Bilateral I am very happy with her blood pressure.  I will check a CBC a CMP and a lipid panel.  I want to keep her LDL cholesterol less than 70.  I encouraged her to continue an aspirin due to her bilateral carotid artery stenosis.  I will also schedule her for an annual carotid Doppler to monitor this for any progression.  I recommended that she get the flu shot, and shingles shot at her earliest convenience.  The patient is also overdue for Prolia so I will ask my nurse to schedule this for her.

## 2022-12-12 ENCOUNTER — Ambulatory Visit
Admission: RE | Admit: 2022-12-12 | Discharge: 2022-12-12 | Disposition: A | Discharge status: Home / Self Care | Payer: PPO | Source: Ambulatory Visit | Attending: Family Medicine | Admitting: Family Medicine

## 2022-12-12 DIAGNOSIS — I6523 Occlusion and stenosis of bilateral carotid arteries: Secondary | ICD-10-CM | POA: Diagnosis not present

## 2022-12-12 LAB — CBC WITH DIFFERENTIAL/PLATELET
Absolute Lymphocytes: 1814 {cells}/uL (ref 850–3900)
Absolute Monocytes: 680 {cells}/uL (ref 200–950)
Basophils Absolute: 38 {cells}/uL (ref 0–200)
Basophils Relative: 0.7 %
Eosinophils Absolute: 130 {cells}/uL (ref 15–500)
Eosinophils Relative: 2.4 %
HCT: 36.3 % (ref 35.0–45.0)
Hemoglobin: 11.7 g/dL (ref 11.7–15.5)
MCH: 30 pg (ref 27.0–33.0)
MCHC: 32.2 g/dL (ref 32.0–36.0)
MCV: 93.1 fL (ref 80.0–100.0)
MPV: 10.9 fL (ref 7.5–12.5)
Monocytes Relative: 12.6 %
Neutro Abs: 2738 {cells}/uL (ref 1500–7800)
Neutrophils Relative %: 50.7 %
Platelets: 167 10*3/uL (ref 140–400)
RBC: 3.9 10*6/uL (ref 3.80–5.10)
RDW: 12.5 % (ref 11.0–15.0)
Total Lymphocyte: 33.6 %
WBC: 5.4 10*3/uL (ref 3.8–10.8)

## 2022-12-12 LAB — COMPLETE METABOLIC PANEL WITH GFR
AG Ratio: 2 (calc) (ref 1.0–2.5)
ALT: 17 U/L (ref 6–29)
AST: 19 U/L (ref 10–35)
Albumin: 4.3 g/dL (ref 3.6–5.1)
Alkaline phosphatase (APISO): 69 U/L (ref 37–153)
BUN: 19 mg/dL (ref 7–25)
CO2: 36 mmol/L — ABNORMAL HIGH (ref 20–32)
Calcium: 10.7 mg/dL — ABNORMAL HIGH (ref 8.6–10.4)
Chloride: 99 mmol/L (ref 98–110)
Creat: 0.81 mg/dL (ref 0.60–0.95)
Globulin: 2.2 g/dL (ref 1.9–3.7)
Glucose, Bld: 126 mg/dL — ABNORMAL HIGH (ref 65–99)
Potassium: 3.7 mmol/L (ref 3.5–5.3)
Sodium: 139 mmol/L (ref 135–146)
Total Bilirubin: 0.7 mg/dL (ref 0.2–1.2)
Total Protein: 6.5 g/dL (ref 6.1–8.1)
eGFR: 71 mL/min/{1.73_m2} (ref >=60)

## 2022-12-12 LAB — LIPID PANEL
Cholesterol: 133 mg/dL (ref <200)
HDL: 74 mg/dL (ref >=50)
LDL Cholesterol (Calc): 43 mg/dL
Non-HDL Cholesterol (Calc): 59 mg/dL (ref <130)
Total CHOL/HDL Ratio: 1.8 (calc) (ref <5.0)
Triglycerides: 78 mg/dL (ref <150)

## 2022-12-12 LAB — TEST AUTHORIZATION

## 2022-12-12 LAB — HEMOGLOBIN A1C
Hgb A1c MFr Bld: 6.2 %{Hb} — ABNORMAL HIGH (ref <5.7)
Mean Plasma Glucose: 131 mg/dL
eAG (mmol/L): 7.3 mmol/L

## 2022-12-30 ENCOUNTER — Other Ambulatory Visit: Payer: Self-pay | Admitting: Family Medicine

## 2022-12-31 NOTE — Telephone Encounter (Signed)
Requested Prescriptions  Pending Prescriptions Disp Refills   atorvastatin (LIPITOR) 40 MG tablet [Pharmacy Med Name: ATORVASTATIN CALCIUM 40MG  TABLET] 90 tablet 3    Sig: TAKE ONE TABLET BY MOUTH ONCE A DAY     Cardiovascular:  Antilipid - Statins Failed - 12/30/2022 11:04 AM      Failed - Valid encounter within last 12 months    Recent Outpatient Visits           1 year ago GAD (generalized anxiety disorder)   [facility] Pickard, Priscille Heidelberg, MD   2 years ago Neuropathy   [facility] Pickard, Priscille Heidelberg, MD   2 years ago Essential hypertension   [facility] Tanya Nones, Priscille Heidelberg, MD   3 years ago Tick bite, initial encounter   [facility] Pickard, Priscille Heidelberg, MD   3 years ago Rhinosinusitis   [facility] Donita Brooks, MD              Failed - Lipid Panel in normal range within the last 12 months    Cholesterol  Date Value Ref Range Status  12/09/2022 133 <200 mg/dL Final   LDL Cholesterol (Calc)  Date Value Ref Range Status  12/09/2022 43 mg/dL (calc) Final    Comment:    Reference range: <100 . Desirable range <100 mg/dL for primary prevention;   <70 mg/dL for patients with CHD or diabetic patients  with > or = 2 CHD risk factors. Marland Kitchen LDL-C is now calculated using the Martin-Hopkins  calculation, which is a validated novel method providing  better accuracy than the Friedewald equation in the  estimation of LDL-C.  Horald Pollen et al. Lenox Ahr. 6237;628(31): 2061-2068  (http://education.[facility].com/faq/FAQ164)    HDL  Date Value Ref Range Status  12/09/2022 74 > OR = 50 mg/dL Final   Triglycerides  Date Value Ref Range Status  12/09/2022 78 <150 mg/dL Final         Passed - Patient is not pregnant

## 2023-01-06 ENCOUNTER — Telehealth: Payer: Self-pay

## 2023-01-06 ENCOUNTER — Other Ambulatory Visit: Payer: Self-pay | Admitting: Family Medicine

## 2023-01-06 NOTE — Telephone Encounter (Signed)
Copied from CRM #[id number]. Topic: Clinical - Medication Question >> Jan 06, 2023  2:07 PM Amy B wrote: Reason for CRM: Patient states she has not heard back regarding Prolia and would like to know if she is still going to receive it.  She requests a call back to discuss, [phone].

## 2023-01-13 ENCOUNTER — Other Ambulatory Visit: Payer: Self-pay | Admitting: Family Medicine

## 2023-01-27 ENCOUNTER — Other Ambulatory Visit: Payer: Self-pay

## 2023-01-27 ENCOUNTER — Telehealth: Payer: Self-pay

## 2023-01-27 DIAGNOSIS — I10 Essential (primary) hypertension: Secondary | ICD-10-CM

## 2023-01-27 MED ORDER — HYDROCHLOROTHIAZIDE 25 MG PO TABS: 25.0000 mg | ORAL_TABLET | Freq: Every day | ORAL | 3 refills | Status: DC

## 2023-01-27 NOTE — Telephone Encounter (Signed)
Copied from CRM [id number]. Topic: Clinical - Medication Refill >> Jan 27, 2023  1:12 PM Thomes Dinning wrote: Most Recent Primary Care Visit:  Provider: Lynnea Ferrier T  Department: [facility]  Visit Type: OFFICE VISIT  Date: 12/09/2022  Medication: hydrochlorothiazide (HYDRODIURIL) 25 MG tablet  Has the patient contacted their pharmacy? Yes (Agent: If no, request that the patient contact the pharmacy for the refill. If patient does not wish to contact the pharmacy document the reason why and proceed with request.) (Agent: If yes, when and what did the pharmacy advise?)  Is this the correct pharmacy for this prescription? Yes If no, delete pharmacy and type the correct one.  This is the patient's preferred pharmacy:  [facility] - [place], [place] - [address] [place] [address] Phone: [phone] Fax: [phone]   Has the prescription been filled recently? Yes  Is the patient out of the medication? No  Has the patient been seen for an appointment in the last year OR does the patient have an upcoming appointment? Yes  Can we respond through MyChart? No  Agent: Please be advised that Rx refills may take up to 3 business days. We ask that you follow-up with your pharmacy.

## 2023-02-10 ENCOUNTER — Other Ambulatory Visit: Payer: Self-pay | Admitting: Family Medicine

## 2023-02-10 DIAGNOSIS — I6523 Occlusion and stenosis of bilateral carotid arteries: Secondary | ICD-10-CM

## 2023-02-10 DIAGNOSIS — I10 Essential (primary) hypertension: Secondary | ICD-10-CM

## 2023-03-24 DIAGNOSIS — H6123 Impacted cerumen, bilateral: Secondary | ICD-10-CM | POA: Diagnosis not present

## 2023-03-24 DIAGNOSIS — H903 Sensorineural hearing loss, bilateral: Secondary | ICD-10-CM | POA: Diagnosis not present

## 2023-04-15 ENCOUNTER — Ambulatory Visit (INDEPENDENT_AMBULATORY_CARE_PROVIDER_SITE_OTHER): Admitting: Family Medicine

## 2023-04-15 ENCOUNTER — Encounter: Payer: Self-pay | Admitting: Family Medicine

## 2023-04-15 VITALS — BP 138/60 | HR 93 | Temp 97.7°F | Ht 61.0 in | Wt 106.0 lb

## 2023-04-15 DIAGNOSIS — R0789 Other chest pain: Secondary | ICD-10-CM | POA: Diagnosis not present

## 2023-04-15 DIAGNOSIS — G5601 Carpal tunnel syndrome, right upper limb: Secondary | ICD-10-CM

## 2023-04-15 NOTE — Progress Notes (Signed)
 Subjective:    Patient ID: Brittany Levy, female    DOB: Dec 25, 1934, 89 y.o.   MRN: [id number]  Gastroesophageal Reflux    Patient is an 89 year old Caucasian female with a history of hypertension which has been difficult to control.  She states that recently she has been having a pressure-like sensation right at the xiphoid process.  It is constant.  There is no relationship to exercise.  She denies any chest pain.  She denies any shortness of breath.  However she does report fatigue.  Recently she got up out of bed suddenly and went to the kitchen.  She states that she passed out as she was walking to the kitchen.  She can feel it coming on.  She denies any palpitations or irregular heartbeats.  She denied any chest pain.  She denied any seizure activity.  EKG today shows normal sinus rhythm at 92 bpm.  The patient has a right bundle branch block as well as Q waves in lead aVL lead V1 and V2.  However these are all old findings and was present on her EKG from 2023  Past Medical History:  Diagnosis Date   Bilateral carotid artery stenosis    Chest pain    GERD (gastroesophageal reflux disease)    Hypertension    Insomnia    Osteoporosis of femur without pathological fracture    Past Surgical History:  Procedure Laterality Date   ABDOMINAL HYSTERECTOMY     BREAST BIOPSY Right 05/07/2016   FRAGMENTS OF LYMPH NODE   Current Outpatient Medications on File Prior to Visit  Medication Sig Dispense Refill   aspirin 81 MG tablet Take 81 mg by mouth daily.     atorvastatin (LIPITOR) 40 MG tablet TAKE ONE TABLET BY MOUTH ONCE A DAY 90 tablet 3   calcium-vitamin D (OSCAL WITH D) 250-125 MG-UNIT per tablet Take 1 tablet by mouth 2 (two) times daily.     carvedilol (COREG) 6.25 MG tablet TAKE ONE TABLET BY MOUTH TWICE A DAY WITH A MEAL 180 tablet 1   clonazePAM (KLONOPIN) 0.5 MG tablet TAKE ONE TABLET BY MOUTH TWICE A DAY AS NEEDED FOR ANXIETY 60 tablet 2   cloNIDine (CATAPRES) 0.2 MG tablet  TAKE ONE TABLET BY MOUTH THREE TIMES DAILY. STOP ATENOLOL AND CLONIDINE PATCH. 270 tablet 0   felodipine (PLENDIL) 5 MG 24 hr tablet TAKE ONE TABLET (5 MG TOTAL) BY MOUTH DAILY. 90 tablet 1   fluticasone (FLONASE) 50 MCG/ACT nasal spray Place 2 sprays into both nostrils daily. 16 g 6   gabapentin (NEURONTIN) 100 MG capsule Take 1 capsule (100 mg total) by mouth 3 (three) times daily as needed. 90 capsule 3   hydrALAZINE (APRESOLINE) 25 MG tablet Take 2 tablets (50 mg total) by mouth 2 (two) times daily. 360 tablet 1   hydrochlorothiazide (HYDRODIURIL) 25 MG tablet Take 1 tablet (25 mg total) by mouth daily. 90 tablet 3   irbesartan (AVAPRO) 300 MG tablet TAKE ONE TABLET (300 MG TOTAL) BY MOUTH DAILY. 90 tablet 1   omeprazole (PRILOSEC) 40 MG capsule Take 40 mg by mouth daily.     potassium chloride (KLOR-CON) 10 MEQ tablet TAKE ONE TABLET BY MOUTH ONCE A DAY 90 tablet 3   Current Facility-Administered Medications on File Prior to Visit  Medication Dose Route Frequency Provider Last Rate Last Admin   denosumab (PROLIA) injection 60 mg  60 mg Subcutaneous Once Donita Brooks, MD       No Known  Allergies  Social History   Socioeconomic History   Marital status: Widowed    Spouse name: Not on file   Number of children: 1   Years of education: Not on file   Highest education level: Not on file  Occupational History   Not on file  Tobacco Use   Smoking status: Never   Smokeless tobacco: Never  Substance and Sexual Activity   Alcohol use: Yes    Comment: wine occasionally   Drug use: No   Sexual activity: Not on file  Other Topics Concern   Not on file  Social History Narrative   Husband passed in 2021.   1 daughter.   2 grand children.   Social Drivers of Corporate investment banker Strain: Low Risk  (02/13/2022)   Overall Financial Resource Strain (CARDIA)    Difficulty of Paying Living Expenses: Not hard at all  Food Insecurity: No Food Insecurity (02/13/2022)   Hunger  Vital Sign    Worried About Running Out of Food in the Last Year: Never true    Ran Out of Food in the Last Year: Never true  Transportation Needs: No Transportation Needs (02/13/2022)   PRAPARE - Administrator, Civil Service (Medical): No    Lack of Transportation (Non-Medical): No  Physical Activity: Sufficiently Active (02/13/2022)   Exercise Vital Sign    Days of Exercise per Week: 5 days    Minutes of Exercise per Session: 30 min  Stress: No Stress Concern Present (02/13/2022)   [employer] of Occupational Health - Occupational Stress Questionnaire    Feeling of Stress : Not at all  Social Connections: Socially Integrated (02/13/2022)   Social Connection and Isolation Panel [NHANES]    Frequency of Communication with Friends and Family: More than three times a week    Frequency of Social Gatherings with Friends and Family: More than three times a week    Attends Religious Services: More than 4 times per year    Active Member of [employer] or Organizations: Yes    Attends Banker Meetings: More than 4 times per year    Marital Status: Married  Catering manager Violence: Not At Risk (02/13/2022)   Humiliation, Afraid, Rape, and Kick questionnaire    Fear of Current or Ex-Partner: No    Emotionally Abused: No    Physically Abused: No    Sexually Abused: No     Review of Systems  All other systems reviewed and are negative.      Objective:   Physical Exam Vitals reviewed.  Constitutional:      General: She is not in acute distress.    Appearance: Normal appearance. She is normal weight. She is not ill-appearing or toxic-appearing.  HENT:     Right Ear: Tympanic membrane and ear canal normal.     Left Ear: Tympanic membrane and ear canal normal.     Mouth/Throat:     Pharynx: No oropharyngeal exudate or posterior oropharyngeal erythema.  Eyes:     Extraocular Movements: Extraocular movements intact.     Conjunctiva/sclera: Conjunctivae normal.      Pupils: Pupils are equal, round, and reactive to light.  Neck:     Vascular: Carotid bruit present.  Cardiovascular:     Rate and Rhythm: Normal rate and regular rhythm.     Pulses: Normal pulses.     Heart sounds: Normal heart sounds. No murmur heard.    No friction rub. No gallop.  Pulmonary:  Effort: Pulmonary effort is normal. No respiratory distress.     Breath sounds: Normal breath sounds. No stridor. No wheezing, rhonchi or rales.  Chest:     Chest wall: No tenderness.  Abdominal:     General: Abdomen is flat. Bowel sounds are normal. There is no distension.     Palpations: Abdomen is soft.     Tenderness: There is no abdominal tenderness. There is no guarding or rebound.  Musculoskeletal:     Right lower leg: No edema.     Left lower leg: No edema.  Skin:    Findings: No rash.  Neurological:     General: No focal deficit present.     Mental Status: She is alert and oriented to person, place, and time.     Cranial Nerves: No cranial nerve deficit.     Motor: No weakness.     Coordination: Coordination normal.     Gait: Gait normal.     Deep Tendon Reflexes: Reflexes normal.  Psychiatric:        Mood and Affect: Mood normal.        Behavior: Behavior normal.        Thought Content: Thought content normal.        Judgment: Judgment normal.           Assessment & Plan:  Carpal tunnel syndrome of right wrist - Plan: Ambulatory referral to Hand Surgery  Chest pressure - Plan: EKG 12-Lead, CBC with Differential/Platelet, COMPLETE METABOLIC PANEL WITH GFR I believe the chest pressure is most likely GI related.  I will increase omeprazole to 40 mg twice a day and see if her symptoms improve.  Meanwhile check CBC and a CMP to evaluate for any anemia or lab abnormalities.  Her blood pressure today is well-controlled.  EKG is essentially unchanged from 2023.  However given her recent syncopal episode which I believe was due to orthostatic hypotension as well as this  atypical chest pressure and fatigue, I have recommended a cardiology consultation for possible stress test.  If the patient develops chest pain she is instructed to go to the emergency room immediately.  She also request a referral to a hand specialist for carpal tunnel syndrome.  I will make that referral however I believe that the chest pressure is more urgent

## 2023-04-16 LAB — CBC WITH DIFFERENTIAL/PLATELET
Absolute Lymphocytes: 2066 {cells}/uL (ref 850–3900)
Absolute Monocytes: 619 {cells}/uL (ref 200–950)
Basophils Absolute: 55 {cells}/uL (ref 0–200)
Basophils Relative: 0.6 %
Eosinophils Absolute: 118 {cells}/uL (ref 15–500)
Eosinophils Relative: 1.3 %
HCT: 36 % (ref 35.0–45.0)
Hemoglobin: 11.9 g/dL (ref 11.7–15.5)
MCH: 29.5 pg (ref 27.0–33.0)
MCHC: 33.1 g/dL (ref 32.0–36.0)
MCV: 89.3 fL (ref 80.0–100.0)
MPV: 11.5 fL (ref 7.5–12.5)
Monocytes Relative: 6.8 %
Neutro Abs: 6243 {cells}/uL (ref 1500–7800)
Neutrophils Relative %: 68.6 %
Platelets: 166 10*3/uL (ref 140–400)
RBC: 4.03 10*6/uL (ref 3.80–5.10)
RDW: 12.5 % (ref 11.0–15.0)
Total Lymphocyte: 22.7 %
WBC: 9.1 10*3/uL (ref 3.8–10.8)

## 2023-04-16 LAB — COMPLETE METABOLIC PANEL WITH GFR
AG Ratio: 2 (calc) (ref 1.0–2.5)
ALT: 17 U/L (ref 6–29)
AST: 18 U/L (ref 10–35)
Albumin: 4.3 g/dL (ref 3.6–5.1)
Alkaline phosphatase (APISO): 78 U/L (ref 37–153)
BUN: 22 mg/dL (ref 7–25)
CO2: 33 mmol/L — ABNORMAL HIGH (ref 20–32)
Calcium: 10.1 mg/dL (ref 8.6–10.4)
Chloride: 97 mmol/L — ABNORMAL LOW (ref 98–110)
Creat: 0.71 mg/dL (ref 0.60–0.95)
Globulin: 2.1 g/dL (ref 1.9–3.7)
Glucose, Bld: 138 mg/dL — ABNORMAL HIGH (ref 65–99)
Potassium: 3.2 mmol/L — ABNORMAL LOW (ref 3.5–5.3)
Sodium: 138 mmol/L (ref 135–146)
Total Bilirubin: 0.7 mg/dL (ref 0.2–1.2)
Total Protein: 6.4 g/dL (ref 6.1–8.1)
eGFR: 83 mL/min/{1.73_m2} (ref >=60)

## 2023-04-25 ENCOUNTER — Other Ambulatory Visit: Payer: Self-pay | Admitting: Family Medicine

## 2023-04-28 NOTE — Telephone Encounter (Signed)
 Requested medication (s) are due for refill today: yes  Requested medication (s) are on the active medication list: yes  Last refill:  12/09/22  Future visit scheduled: no  Notes to clinic:  Unable to refill per protocol, last refill by another/historical provider.      Requested Prescriptions  Pending Prescriptions Disp Refills   omeprazole (PRILOSEC) 40 MG capsule [Pharmacy Med Name: OMEPRAZOLE 40MG  CAPSULE DR] 90 capsule 3    Sig: TAKE ONE CAPSULE BY MOUTH ONCE DAILY     Gastroenterology: Proton Pump Inhibitors Failed - 04/28/2023  9:06 AM      Failed - Valid encounter within last 12 months    Recent Outpatient Visits           1 year ago GAD (generalized anxiety disorder)   [facility] Pickard, Priscille Heidelberg, MD   2 years ago Neuropathy   [facility] Pickard, Priscille Heidelberg, MD   3 years ago Essential hypertension   [facility] Tanya Nones, Priscille Heidelberg, MD   3 years ago Tick bite, initial encounter   [facility] Pickard, Priscille Heidelberg, MD   3 years ago Rhinosinusitis   [facility] Pickard, Priscille Heidelberg, MD       Future Appointments             In 2 months Herbie Baltimore Piedad Climes, MD [facility]            Signed Prescriptions Disp Refills   potassium chloride (KLOR-CON) 10 MEQ tablet 90 tablet 0    Sig: TAKE ONE TABLET BY MOUTH ONCE A DAY     Endocrinology:  Minerals - Potassium Supplementation Failed - 04/28/2023  9:06 AM      Failed - K in normal range and within 360 days    Potassium  Date Value Ref Range Status  04/15/2023 3.2 (L) 3.5 - 5.3 mmol/L Final         Failed - Valid encounter within last 12 months    Recent Outpatient Visits           1 year ago GAD (generalized anxiety disorder)   [facility] Donita Brooks, MD   2 years ago Neuropathy   [facility] Tanya Nones, Priscille Heidelberg, MD   3 years ago Essential hypertension   [facility] Tanya Nones, Priscille Heidelberg, MD   3 years ago Tick bite, initial encounter   [facility] Pickard, Priscille Heidelberg, MD   3 years ago Rhinosinusitis   [facility] Pickard, Priscille Heidelberg, MD       Future Appointments             In 2 months Marykay Lex, MD [facility] - Cr in normal range and within 360 days    Creat  Date Value Ref Range Status  04/15/2023 0.71 0.60 - 0.95 mg/dL Final

## 2023-04-28 NOTE — Telephone Encounter (Signed)
 Requested Prescriptions  Pending Prescriptions Disp Refills   potassium chloride (KLOR-CON) 10 MEQ tablet [Pharmacy Med Name: POTASSIUM CHLORIDE ER ER TABLET ER] 90 tablet 0    Sig: TAKE ONE TABLET BY MOUTH ONCE A DAY     Endocrinology:  Minerals - Potassium Supplementation Failed - 04/28/2023  9:06 AM      Failed - K in normal range and within 360 days    Potassium  Date Value Ref Range Status  04/15/2023 3.2 (L) 3.5 - 5.3 mmol/L Final         Failed - Valid encounter within last 12 months    Recent Outpatient Visits           1 year ago GAD (generalized anxiety disorder)   [facility] Pickard, Priscille Heidelberg, MD   2 years ago Neuropathy   [facility] Pickard, Priscille Heidelberg, MD   3 years ago Essential hypertension   [facility] Tanya Nones, Priscille Heidelberg, MD   3 years ago Tick bite, initial encounter   [facility] Pickard, Priscille Heidelberg, MD   3 years ago Rhinosinusitis   [facility] Pickard, Priscille Heidelberg, MD       Future Appointments             In 2 months Marykay Lex, MD [facility] - Cr in normal range and within 360 days    Creat  Date Value Ref Range Status  04/15/2023 0.71 0.60 - 0.95 mg/dL Final          omeprazole (PRILOSEC) 40 MG capsule [Pharmacy Med Name: OMEPRAZOLE 40MG  CAPSULE DR] 90 capsule 3    Sig: TAKE ONE CAPSULE BY MOUTH ONCE DAILY     Gastroenterology: Proton Pump Inhibitors Failed - 04/28/2023  9:06 AM      Failed - Valid encounter within last 12 months    Recent Outpatient Visits           1 year ago GAD (generalized anxiety disorder)   [facility] Donita Brooks, MD   2 years ago Neuropathy   [facility] Tanya Nones, Priscille Heidelberg, MD   3 years ago Essential hypertension   [facility] Tanya Nones, Priscille Heidelberg, MD   3 years ago Tick bite, initial encounter   [facility]  Pickard, Priscille Heidelberg, MD   3 years ago Rhinosinusitis   [facility] Pickard, Priscille Heidelberg, MD       Future Appointments             In 2 months Herbie Baltimore Piedad Climes, MD [facility]

## 2023-04-29 DIAGNOSIS — M1811 Unilateral primary osteoarthritis of first carpometacarpal joint, right hand: Secondary | ICD-10-CM | POA: Diagnosis not present

## 2023-05-15 ENCOUNTER — Other Ambulatory Visit: Payer: Self-pay | Admitting: Family Medicine

## 2023-05-15 DIAGNOSIS — I10 Essential (primary) hypertension: Secondary | ICD-10-CM

## 2023-05-16 NOTE — Telephone Encounter (Signed)
 Requested Prescriptions  Pending Prescriptions Disp Refills   hydrALAZINE (APRESOLINE) 25 MG tablet [Pharmacy Med Name: HYDRALAZINE HYDROCHLORIDE 25MG  TABLET] 360 tablet 0    Sig: TAKE TWO TABLETS (50 MG TOTAL) BY MOUTH TWO (TWO) TIMES DAILY.     Cardiovascular:  Vasodilators Failed - 05/16/2023  9:19 AM      Failed - ANA Screen, Ifa, Serum in normal range and within 360 days    No results found for: "ANA", "ANATITER", "LABANTI"       Passed - HCT in normal range and within 360 days    HCT  Date Value Ref Range Status  04/15/2023 36.0 35.0 - 45.0 % Final         Passed - HGB in normal range and within 360 days    Hemoglobin  Date Value Ref Range Status  04/15/2023 11.9 11.7 - 15.5 g/dL Final         Passed - RBC in normal range and within 360 days    RBC  Date Value Ref Range Status  04/15/2023 4.03 3.80 - 5.10 Million/uL Final         Passed - WBC in normal range and within 360 days    WBC  Date Value Ref Range Status  04/15/2023 9.1 3.8 - 10.8 Thousand/uL Final         Passed - PLT in normal range and within 360 days    Platelets  Date Value Ref Range Status  04/15/2023 166 140 - 400 Thousand/uL Final         Passed - Last BP in normal range    BP Readings from Last 1 Encounters:  04/15/23 138/60         Passed - Valid encounter within last 12 months    Recent Outpatient Visits           1 month ago Carpal tunnel syndrome of right wrist   [place] [facility] Tanya Nones, Priscille Heidelberg, MD   5 months ago Primary hypertension   [facility] Donita Brooks, MD   11 months ago Allergic contact dermatitis due to drugs in contact with skin   [facility] Donita Brooks, MD   1 year ago Subacute sinusitis, unspecified location   [place] [facility] Donita Brooks, MD   1 year ago Viral upper respiratory tract infection   [facility]  Pickard, Priscille Heidelberg, MD       Future Appointments             In 2 months Herbie Baltimore Piedad Climes, MD [facility] at [facility]

## 2023-06-20 ENCOUNTER — Other Ambulatory Visit: Payer: Self-pay

## 2023-06-20 ENCOUNTER — Telehealth: Payer: Self-pay

## 2023-06-20 DIAGNOSIS — M810 Age-related osteoporosis without current pathological fracture: Secondary | ICD-10-CM

## 2023-06-20 DIAGNOSIS — K219 Gastro-esophageal reflux disease without esophagitis: Secondary | ICD-10-CM

## 2023-06-20 MED ORDER — DENOSUMAB 60 MG/ML ~~LOC~~ SOSY: 60.0000 mg | PREFILLED_SYRINGE | Freq: Once | SUBCUTANEOUS | Status: AC

## 2023-06-20 MED ORDER — OMEPRAZOLE 40 MG PO CPDR: 40.0000 mg | DELAYED_RELEASE_CAPSULE | Freq: Every day | ORAL | 3 refills | Status: DC

## 2023-06-20 MED ORDER — OMEPRAZOLE 40 MG PO CPDR: 40.0000 mg | DELAYED_RELEASE_CAPSULE | Freq: Two times a day (BID) | ORAL | 3 refills | Status: AC

## 2023-06-20 NOTE — Telephone Encounter (Signed)
 Pt came into office to request a refill of these meds listed. Pt stated that she needs a new prescription refill for this med potassium chloride  (KLOR-CON ) 10 MEQ tablet [[id number]]. Pt would like to speak with the nurse please as well about these medications.    potassium chloride  (KLOR-CON ) 10 MEQ tablet [[id number]] (needing new prescription) omeprazole  (PRILOSEC) 40 MG capsule [[id number]]  PHARMACY: [facility] - [place], [place] - [address] [place], [place] [address] Phone: [phone]  Fax: [phone]  CB#: [phone]

## 2023-06-23 ENCOUNTER — Ambulatory Visit (INDEPENDENT_AMBULATORY_CARE_PROVIDER_SITE_OTHER): Admitting: Family Medicine

## 2023-06-23 ENCOUNTER — Telehealth: Payer: Self-pay

## 2023-06-23 VITALS — BP 136/62 | HR 74 | Temp 97.9°F | Ht 61.0 in | Wt 106.0 lb

## 2023-06-23 DIAGNOSIS — R7303 Prediabetes: Secondary | ICD-10-CM

## 2023-06-23 NOTE — Progress Notes (Signed)
 Subjective:    Patient ID: Brittany Levy, female    DOB: 20-May-1934, 89 y.o.   MRN: [id number]  HPI Patient is a very sweet 89 year old Caucasian female who wants to discuss possibly moving to an assisted living facility.  She lives independently.  She is now a widow.  The upkeep of her house is causing her problems.  Physically she is still able to drive.  She is still able to perform her activities of daily living.  She manages her finances.  She cooks and prepares her meals.  However the maintenance and upkeep of her home is causing her stress.  [place].  From a medical standpoint, the patient has a history of hypertension as well as prediabetes.  However otherwise, she is intromission the patient.  Her mental faculties are very impressive.  She is still able to perform all of her independent activities of daily living.  She is still driving and managing her finances without difficulty  Past Medical History:  Diagnosis Date   Bilateral carotid artery stenosis    Chest pain    GERD (gastroesophageal reflux disease)    Hypertension    Insomnia    Osteoporosis of femur without pathological fracture    Past Surgical History:  Procedure Laterality Date   ABDOMINAL HYSTERECTOMY     BREAST BIOPSY Right 05/07/2016   FRAGMENTS OF LYMPH NODE   Current Outpatient Medications on File Prior to Visit  Medication Sig Dispense Refill   aspirin 81 MG tablet Take 81 mg by mouth daily.     atorvastatin  (LIPITOR) 40 MG tablet TAKE ONE TABLET BY MOUTH ONCE A DAY 90 tablet 3   calcium -vitamin D (OSCAL WITH D) 250-125 MG-UNIT per tablet Take 1 tablet by mouth 2 (two) times daily.     carvedilol  (COREG ) 6.25 MG tablet TAKE ONE TABLET BY MOUTH TWICE A DAY WITH A MEAL 180 tablet 1   clonazePAM  (KLONOPIN ) 0.5 MG tablet TAKE ONE TABLET BY MOUTH TWICE A DAY AS NEEDED FOR ANXIETY 60 tablet 2   cloNIDine  (CATAPRES ) 0.2 MG tablet TAKE ONE TABLET BY MOUTH  THREE TIMES DAILY. STOP ATENOLOL  AND CLONIDINE  PATCH. 270 tablet 0   felodipine  (PLENDIL ) 5 MG 24 hr tablet TAKE ONE TABLET (5 MG TOTAL) BY MOUTH DAILY. 90 tablet 1   fluticasone  (FLONASE ) 50 MCG/ACT nasal spray Place 2 sprays into both nostrils daily. 16 g 6   gabapentin  (NEURONTIN ) 100 MG capsule Take 1 capsule (100 mg total) by mouth 3 (three) times daily as needed. 90 capsule 3   hydrALAZINE  (APRESOLINE ) 25 MG tablet TAKE TWO TABLETS (50 MG TOTAL) BY MOUTH TWO (TWO) TIMES DAILY. 360 tablet 0   hydrochlorothiazide  (HYDRODIURIL ) 25 MG tablet Take 1 tablet (25 mg total) by mouth daily. 90 tablet 3   irbesartan  (AVAPRO ) 300 MG tablet TAKE ONE TABLET (300 MG TOTAL) BY MOUTH DAILY. 90 tablet 1   omeprazole  (PRILOSEC) 40 MG capsule Take 1 capsule (40 mg total) by mouth in the morning and at bedtime. 180 capsule 3   potassium chloride  (KLOR-CON ) 10 MEQ tablet TAKE ONE TABLET BY MOUTH ONCE A DAY 90 tablet 0   Current Facility-Administered Medications on File Prior to Visit  Medication Dose Route Frequency Provider Last Rate Last Admin   [START ON 07/04/2023] denosumab  (PROLIA ) injection 60 mg  60 mg Subcutaneous Once Ceciley Buist T, MD         No Known  Allergies  Social History   Socioeconomic History   Marital status: Widowed    Spouse name: Not on file   Number of children: 1   Years of education: Not on file   Highest education level: Not on file  Occupational History   Not on file  Tobacco Use   Smoking status: Never   Smokeless tobacco: Never  Substance and Sexual Activity   Alcohol use: Yes    Comment: wine occasionally   Drug use: No   Sexual activity: Not on file  Other Topics Concern   Not on file  Social History Narrative   Husband passed in 2021.   1 daughter.   2 grand children.   Social Drivers of Corporate investment banker Strain: Low Risk  (02/13/2022)   Overall Financial Resource Strain (CARDIA)    Difficulty of Paying Living Expenses: Not hard at all   Food Insecurity: No Food Insecurity (02/13/2022)   Hunger Vital Sign    Worried About Running Out of Food in the Last Year: Never true    Ran Out of Food in the Last Year: Never true  Transportation Needs: No Transportation Needs (02/13/2022)   PRAPARE - Administrator, Civil Service (Medical): No    Lack of Transportation (Non-Medical): No  Physical Activity: Sufficiently Active (02/13/2022)   Exercise Vital Sign    Days of Exercise per Week: 5 days    Minutes of Exercise per Session: 30 min  Stress: No Stress Concern Present (02/13/2022)   [employer] of Occupational Health - Occupational Stress Questionnaire    Feeling of Stress : Not at all  Social Connections: Socially Integrated (02/13/2022)   Social Connection and Isolation Panel [NHANES]    Frequency of Communication with Friends and Family: More than three times a week    Frequency of Social Gatherings with Friends and Family: More than three times a week    Attends Religious Services: More than 4 times per year    Active Member of [employer] or Organizations: Yes    Attends Engineer, structural: More than 4 times per year    Marital Status: Married  Catering manager Violence: Not At Risk (02/13/2022)   Humiliation, Afraid, Rape, and Kick questionnaire    Fear of Current or Ex-Partner: No    Emotionally Abused: No    Physically Abused: No    Sexually Abused: No     Review of Systems     Objective:   Physical Exam Constitutional:      General: She is not in acute distress.    Appearance: Normal appearance. She is normal weight. She is not ill-appearing or toxic-appearing.  Cardiovascular:     Rate and Rhythm: Normal rate and regular rhythm.     Pulses: Normal pulses.     Heart sounds: Normal heart sounds. No murmur heard.    No friction rub.  Pulmonary:     Effort: Pulmonary effort is normal. No respiratory distress.     Breath sounds: Normal breath sounds. No stridor. No wheezing, rhonchi or  rales.  Musculoskeletal:        General: No swelling, tenderness, deformity or signs of injury.     Right lower leg: Normal. No swelling, deformity, tenderness or bony tenderness. No edema.     Left lower leg: Normal. No swelling, deformity, tenderness or bony tenderness. No edema.  Neurological:     General: No focal deficit present.     Mental Status: She is  alert and oriented to person, place, and time.     Cranial Nerves: No cranial nerve deficit.     Sensory: No sensory deficit.     Motor: No weakness.     Coordination: Coordination normal.     Gait: Gait normal.     Deep Tendon Reflexes: Reflexes normal.          Assessment & Plan:  Prediabetes - Plan: Hemoglobin A1c, Comprehensive metabolic panel with GFR Patient is in tremendous shape for her age.  Her mental status is clear and sharp.  She has no age-related cognitive decline.  She is able to perform all of her activities of daily living independently and manage all of her independent activities of daily living.  She is still driving and managing her finances.  Her blood pressure is well-controlled.  I will check a CMP and a hemoglobin A1c to monitor her prediabetes.  I have no medical concerns about her moving to an assisted living facility or an apartment around an assisted living facility.  I believe that that would be an excellent choice for her as it would provide her more social interactions and removed the stress of caring for her home

## 2023-06-23 NOTE — Telephone Encounter (Signed)
 Prolia  VOB initiated via [email or web address]  Next Prolia  inj DUE: 07/04/23

## 2023-06-24 ENCOUNTER — Ambulatory Visit: Payer: Self-pay | Admitting: Family Medicine

## 2023-06-24 LAB — COMPREHENSIVE METABOLIC PANEL WITH GFR
AG Ratio: 1.8 (calc) (ref 1.0–2.5)
ALT: 18 U/L (ref 6–29)
AST: 18 U/L (ref 10–35)
Albumin: 4.4 g/dL (ref 3.6–5.1)
Alkaline phosphatase (APISO): 83 U/L (ref 37–153)
BUN: 19 mg/dL (ref 7–25)
CO2: 33 mmol/L — ABNORMAL HIGH (ref 20–32)
Calcium: 10.8 mg/dL — ABNORMAL HIGH (ref 8.6–10.4)
Chloride: 98 mmol/L (ref 98–110)
Creat: 0.76 mg/dL (ref 0.60–0.95)
Globulin: 2.5 g/dL (ref 1.9–3.7)
Glucose, Bld: 91 mg/dL (ref 65–99)
Potassium: 4.2 mmol/L (ref 3.5–5.3)
Sodium: 139 mmol/L (ref 135–146)
Total Bilirubin: 0.9 mg/dL (ref 0.2–1.2)
Total Protein: 6.9 g/dL (ref 6.1–8.1)
eGFR: 76 mL/min/{1.73_m2} (ref >=60)

## 2023-06-24 LAB — HEMOGLOBIN A1C
Hgb A1c MFr Bld: 6.3 % — ABNORMAL HIGH (ref <5.7)
Mean Plasma Glucose: 134 mg/dL
eAG (mmol/L): 7.4 mmol/L

## 2023-06-26 ENCOUNTER — Other Ambulatory Visit (HOSPITAL_COMMUNITY): Payer: Self-pay

## 2023-06-26 NOTE — Telephone Encounter (Signed)
 Pt ready for scheduling for PROLIA  on or after : 07/04/23  Option# 1: Buy/Bill (Office supplied medication)  Out-of-pocket cost due at time of clinic visit: $332  Number of injection/visits approved: ---  Primary: [employer] Prolia  co-insurance: 20% Admin fee co-insurance: 0%  Secondary: --- Prolia  co-insurance:  Admin fee co-insurance:   Medical Benefit Details: Date Benefits were checked: 06/26/23 Deductible: NO/ Coinsurance: 20%/ Admin Fee: 0%  Prior Auth: N/A PA# Expiration Date:   # of doses approved: ----------------------------------------------------------------------- Option# 2- Med Obtained from pharmacy:  Pharmacy benefit: Copay $250 (Paid to pharmacy) Admin Fee: 0% (Pay at clinic)  Prior Auth: N/A PA# Expiration Date:   # of doses approved:   If patient wants fill through the pharmacy benefit please send prescription to: [employer]/RX ADVANCE, and include estimated need by date in rx notes. Pharmacy will ship medication directly to the office.  Patient NOT eligible for Prolia  Copay Card. Copay Card can make patient's cost as little as $25. Link to apply: [email or web address]://[email or web address]  ** This summary of benefits is an estimation of the patient's out-of-pocket cost. Exact cost may very based on individual plan coverage.

## 2023-06-27 ENCOUNTER — Other Ambulatory Visit: Payer: Self-pay | Admitting: Family Medicine

## 2023-07-01 NOTE — Telephone Encounter (Signed)
 Last OV 06/23/23.  Requested Prescriptions  Pending Prescriptions Disp Refills   felodipine  (PLENDIL ) 5 MG 24 hr tablet [Pharmacy Med Name: FELODIPINE  ER 5MG  ER TABLET ER 24HR] 90 tablet 0    Sig: TAKE ONE TABLET (5 MG TOTAL) BY MOUTH DAILY.     Cardiovascular: Calcium  Channel Blockers 2 Failed - 07/01/2023 12:11 PM      Failed - Valid encounter within last 6 months    Recent Outpatient Visits           1 week ago Prediabetes   [place] [facility] Pickard, Cisco Crest, MD   2 months ago Carpal tunnel syndrome of right wrist   [place] [facility] Cheril Cork, Cisco Crest, MD   6 months ago Primary hypertension   [place] [facility] Austine Lefort, MD   1 year ago Allergic contact dermatitis due to drugs in contact with skin   [place] [facility] Austine Lefort, MD   1 year ago Subacute sinusitis, unspecified location   [place] [facility] Pickard, Cisco Crest, MD       Future Appointments             In 2 weeks Arleen Lacer, MD [place] [facility] - Last BP in normal range    BP Readings from Last 1 Encounters:  06/23/23 136/62         Passed - Last Heart Rate in normal range    Pulse Readings from Last 1 Encounters:  06/23/23 74

## 2023-07-07 ENCOUNTER — Other Ambulatory Visit: Payer: Self-pay | Admitting: Family Medicine

## 2023-07-07 NOTE — Telephone Encounter (Signed)
 Copied from CRM [id number]. Topic: Clinical - Medication Refill >> Jul 07, 2023  8:33 AM Freya Jesus wrote: Medication: potassium chloride  (KLOR-CON ) 10 MEQ tablet [[id number]] felodipine  (PLENDIL ) 5 MG 24 hr tablet [[id number]]  Has the patient contacted their pharmacy? No (Agent: If no, request that the patient contact the pharmacy for the refill. If patient does not wish to contact the pharmacy document the reason why and proceed with request.) (Agent: If yes, when and what did the pharmacy advise?)  This is the patient's preferred pharmacy:  [facility] - [place], [place] - [address] [place] [address] Phone: [phone] Fax: [phone]  Is this the correct pharmacy for this prescription? Yes If no, delete pharmacy and type the correct one.   Has the prescription been filled recently? No  Is the patient out of the medication? No, almost  Has the patient been seen for an appointment in the last year OR does the patient have an upcoming appointment? Yes  Can we respond through MyChart? Yes  Agent: Please be advised that Rx refills may take up to 3 business days. We ask that you follow-up with your pharmacy.

## 2023-07-08 ENCOUNTER — Telehealth: Payer: Self-pay

## 2023-07-08 ENCOUNTER — Other Ambulatory Visit: Payer: Self-pay | Admitting: Family Medicine

## 2023-07-08 MED ORDER — POTASSIUM CHLORIDE ER 10 MEQ PO TBCR: 10.0000 meq | EXTENDED_RELEASE_TABLET | Freq: Every day | ORAL | 0 refills | Status: DC

## 2023-07-08 NOTE — Telephone Encounter (Signed)
 Copied from CRM [id number]. Topic: Clinical - Medical Advice >> Jul 08, 2023  8:14 AM Tiffany B wrote: Reason for CRM: Patient is feeling out paperwork for [facility] and patient would like to know  the dates of her Disc surgery and hysterectomy surgery, please follow up with patient prior to 3pm.

## 2023-07-08 NOTE — Telephone Encounter (Signed)
 Requested Prescriptions  Pending Prescriptions Disp Refills   felodipine  (PLENDIL ) 5 MG 24 hr tablet 90 tablet 0     Cardiovascular: Calcium  Channel Blockers 2 Failed - 07/08/2023 11:10 AM      Failed - Valid encounter within last 6 months    Recent Outpatient Visits           2 weeks ago Prediabetes   [place] [facility] Pickard, Cisco Crest, MD   2 months ago Carpal tunnel syndrome of right wrist   [place] [facility] Cheril Cork, Cisco Crest, MD   7 months ago Primary hypertension   [place] [facility] Austine Lefort, MD   1 year ago Allergic contact dermatitis due to drugs in contact with skin   [place] [facility] Austine Lefort, MD   1 year ago Subacute sinusitis, unspecified location   [place] [facility] Pickard, Cisco Crest, MD       Future Appointments             In 1 week Arleen Lacer, MD [place] [facility] - Last BP in normal range    BP Readings from Last 1 Encounters:  06/23/23 136/62         Passed - Last Heart Rate in normal range    Pulse Readings from Last 1 Encounters:  06/23/23 74          potassium chloride  (KLOR-CON ) 10 MEQ tablet 90 tablet 0    Sig: Take 1 tablet (10 mEq total) by mouth daily.     Endocrinology:  Minerals - Potassium Supplementation Passed - 07/08/2023 11:10 AM      Passed - K in normal range and within 360 days    Potassium  Date Value Ref Range Status  06/23/2023 4.2 3.5 - 5.3 mmol/L Final         Passed - Cr in normal range and within 360 days    Creat  Date Value Ref Range Status  06/23/2023 0.76 0.60 - 0.95 mg/dL Final         Passed - Valid encounter within last 12 months    Recent Outpatient Visits           2 weeks ago Prediabetes   [place] [facility] Cheril Cork, Cisco Crest, MD   2 months ago Carpal tunnel syndrome of right wrist   [place] [facility] Austine Lefort, MD   7 months ago Primary hypertension   [place] [facility] Austine Lefort, MD   1 year ago Allergic contact dermatitis due to drugs in contact with skin    [facility] Austine Lefort, MD   1 year ago Subacute sinusitis, unspecified location   [facility] Pickard, Cisco Crest, MD       Future Appointments             In 1 week Addie Holstein Vashti Gentles, MD [facility] at [facility]

## 2023-07-08 NOTE — Telephone Encounter (Signed)
 Refilled 07/01/23. Requested Prescriptions  Signed Prescriptions Disp Refills   potassium chloride  (KLOR-CON ) 10 MEQ tablet 90 tablet 0    Sig: Take 1 tablet (10 mEq total) by mouth daily.     Endocrinology:  Minerals - Potassium Supplementation Passed - 07/08/2023 11:12 AM      Passed - K in normal range and within 360 days    Potassium  Date Value Ref Range Status  06/23/2023 4.2 3.5 - 5.3 mmol/L Final         Passed - Cr in normal range and within 360 days    Creat  Date Value Ref Range Status  06/23/2023 0.76 0.60 - 0.95 mg/dL Final         Passed - Valid encounter within last 12 months    Recent Outpatient Visits           2 weeks ago Prediabetes   [place] [facility] Cheril Cork, Cisco Crest, MD   2 months ago Carpal tunnel syndrome of right wrist   [place] [facility] Cheril Cork, Cisco Crest, MD   7 months ago Primary hypertension   [place] [facility] Austine Lefort, MD   1 year ago Allergic contact dermatitis due to drugs in contact with skin   [place] [facility] Austine Lefort, MD   1 year ago Subacute sinusitis, unspecified location   [place] [facility] Pickard, Cisco Crest, MD       Future Appointments             In 1 week Arleen Lacer, MD [place] [facility]            Refused Prescriptions Disp Refills   felodipine  (PLENDIL ) 5 MG 24 hr tablet 90 tablet 0     Cardiovascular: Calcium  Channel Blockers 2 Failed - 07/08/2023 11:12 AM      Failed - Valid encounter within last 6 months    Recent Outpatient Visits           2 weeks ago Prediabetes   [place] [facility] Cheril Cork, Cisco Crest, MD   2 months ago Carpal tunnel syndrome of right wrist   [place] [facility] Cheril Cork, Cisco Crest, MD   7 months ago Primary hypertension   [place] [facility] Austine Lefort, MD   1 year ago  Allergic contact dermatitis due to drugs in contact with skin   [place] [facility] Austine Lefort, MD   1 year ago Subacute sinusitis, unspecified location   [place] [facility] Pickard, Cisco Crest, MD       Future Appointments             In 1 week Arleen Lacer, MD [place] [facility] - Last BP in normal range    BP Readings from Last 1 Encounters:  06/23/23 136/62         Passed - Last Heart Rate in normal range    Pulse Readings from Last 1 Encounters:  06/23/23 74

## 2023-07-08 NOTE — Telephone Encounter (Unsigned)
 Copied from CRM [id number]. Topic: Clinical - Medication Refill >> Jul 08, 2023  8:12 AM Tiffany B wrote: Medication: potassium chloride  (KLOR-CON ) 10 MEQ tablet   Has the patient contacted their pharmacy? Yes,Contact PCP office    This is the patient's preferred pharmacy:  [facility] - [place], [place] - [address] [place] [address] Phone: [phone] Fax: [phone]  Is this the correct pharmacy for this prescription? Yes If no, delete pharmacy and type the correct one.   Has the prescription been filled recently? Yes  Is the patient out of the medication? No  Has the patient been seen for an appointment in the last year OR does the patient have an upcoming appointment? Yes  Can we respond through MyChart? No  Agent: Please be advised that Rx refills may take up to 3 business days. We ask that you follow-up with your pharmacy.

## 2023-07-09 NOTE — Telephone Encounter (Signed)
 Requested by interface [email or web address]. Duplicate request. Receipt confirmed by pharmacy 07/08/23 at 11:11am.  Requested Prescriptions  Refused Prescriptions Disp Refills   potassium chloride  (KLOR-CON ) 10 MEQ tablet 90 tablet 0    Sig: Take 1 tablet (10 mEq total) by mouth daily.     Endocrinology:  Minerals - Potassium Supplementation Passed - 07/09/2023 10:52 AM      Passed - K in normal range and within 360 days    Potassium  Date Value Ref Range Status  06/23/2023 4.2 3.5 - 5.3 mmol/L Final         Passed - Cr in normal range and within 360 days    Creat  Date Value Ref Range Status  06/23/2023 0.76 0.60 - 0.95 mg/dL Final         Passed - Valid encounter within last 12 months    Recent Outpatient Visits           2 weeks ago Prediabetes   [facility] Cheril Cork, Cisco Crest, MD   2 months ago Carpal tunnel syndrome of right wrist   [place] [facility] Austine Lefort, MD   7 months ago Primary hypertension   [place] [facility] Austine Lefort, MD   1 year ago Allergic contact dermatitis due to drugs in contact with skin   [facility] Austine Lefort, MD   1 year ago Subacute sinusitis, unspecified location   [facility] Pickard, Cisco Crest, MD       Future Appointments             In 1 week Addie Holstein Vashti Gentles, MD [facility] at [facility]

## 2023-07-09 NOTE — Telephone Encounter (Signed)
 Called pharmacy to verify receipt confirmed 07/08/23 at 11:11 am . On hold extended time and unable to continue to hold.

## 2023-07-17 ENCOUNTER — Ambulatory Visit: Attending: Cardiology | Admitting: Cardiology

## 2023-07-17 VITALS — BP 168/68 | HR 75 | Ht 61.0 in | Wt 109.0 lb

## 2023-07-17 DIAGNOSIS — I6523 Occlusion and stenosis of bilateral carotid arteries: Secondary | ICD-10-CM

## 2023-07-17 DIAGNOSIS — R0789 Other chest pain: Secondary | ICD-10-CM

## 2023-07-17 DIAGNOSIS — K219 Gastro-esophageal reflux disease without esophagitis: Secondary | ICD-10-CM

## 2023-07-17 DIAGNOSIS — R079 Chest pain, unspecified: Secondary | ICD-10-CM | POA: Diagnosis not present

## 2023-07-17 DIAGNOSIS — R0600 Dyspnea, unspecified: Secondary | ICD-10-CM | POA: Diagnosis not present

## 2023-07-17 DIAGNOSIS — I10 Essential (primary) hypertension: Secondary | ICD-10-CM

## 2023-07-17 DIAGNOSIS — R0989 Other specified symptoms and signs involving the circulatory and respiratory systems: Secondary | ICD-10-CM

## 2023-07-17 NOTE — Patient Instructions (Signed)
 Medication Instructions:  Your physician recommends that you continue on your current medications as directed. Please refer to the Current Medication list given to you today.   *If you need a refill on your cardiac medications before your next appointment, please call your pharmacy*  Lab Work: None ordered at this time  If you have labs (blood work) drawn today and your tests are completely normal, you will receive your results only by: MyChart Message (if you have MyChart) OR A paper copy in the mail If you have any lab test that is abnormal or we need to change your treatment, we will call you to review the results.  Testing/Procedures: None ordered at this time   Follow-Up: At [facility], you and your health needs are our priority.  As part of our continuing mission to provide you with exceptional heart care, our providers are all part of one team.  This team includes your primary Cardiologist (physician) and Advanced Practice Providers or APPs (Physician Assistants and Nurse Practitioners) who all work together to provide you with the care you need, when you need it.  Your next appointment:   3 month(s)  Provider:   You may see one of the following Advanced Practice Providers on your designated Care Team:   Laneta Pintos, NP Gildardo Labrador, PA-C Varney Gentleman, PA-C Cadence [place], PA-C Ronald Cockayne, NP Morey Ar, NP    We recommend signing up for the patient portal called MyChart.  Sign up information is provided on this After Visit Summary.  MyChart is used to connect with patients for Virtual Visits (Telemedicine).  Patients are able to view lab/test results, encounter notes, upcoming appointments, etc.  Non-urgent messages can be sent to your provider as well.   To learn more about what you can do with MyChart, go to [email or web address].

## 2023-07-17 NOTE — Progress Notes (Signed)
 Cardiology Office Note:  .   Date:  07/23/2023  ID:  Brittany Levy, DOB [birth year], MRN [id number] PCP: Austine Lefort, MD  [facility] Providers Cardiologist:  None     Chief Complaint  Patient presents with   New Patient (Initial Visit)    Ref by Dr. Cheril Cork for shortness of breath and chest pressure.     Patient Profile: .     Brittany Levy is a relatively healthy 89 y.o. female  with a PMH notable for HTN (on multiple medication), pre--DM-2, with bilateral carotid stenosis who presents here for Evaluation of Epigastric Discomfort at the request of Austine Lefort, MD.  Ireta was seen by Dr. Stann Earnest in April 2014 for exertional dyspnea.  At that time she noted to have labile hypertension requiring clonidine .  He noted that she had had a heart catheterization back in 1999 at [facility] by Dr. Bary Likes had normal course right dominant.  Stress echo ordered to evaluate dyspnea.     Subjective  Discussed the use of AI scribe software for clinical note transcription with the patient, who gave verbal consent to proceed.  History of Present Illness History of Present Illness Brittany Levy is an 89 year old female with hypertension who presents with abdominal pressure and shortness of breath. She was referred by Dr. Cheril Cork for evaluation of abdominal pressure and shortness of breath.  Unfortunately, she does not provide a very coherent history, and spends more time try to explain what she thinks the symptoms are as well as what symptoms she is having.  She does not seem to be having exertional chest discomfort with routine activity or increased activity.  Some exertional dyspnea but not always.  She does get out and about and go shopping some but has to stop occasionally.  She experiences intermittent abdominal pressure, described as a sensation 'right underneath here' (indicating underneath the left lateral rib cage/epigastric area),' which began six weeks ago.  The pressure is not constant, occurring a couple of times a week, especially when bending over. It is not painful but causes a feeling of breathlessness. Omeprazole  has been effective in alleviating these symptoms.  She also experiences episodes of shortness of breath, which she sometimes attributes to nervousness. She has dizziness, particularly when taking hydralazine , and has adjusted her medication intake accordingly. Two months ago, she had an episode of syncope at night, possibly due to excessive medication intake. No chest pain or palpitations are reported during these episodes.  Her current medications include atorvastatin  40 mg daily, felodipine  5 mg daily, hydrochlorothiazide  25 mg daily, carvedilol  6.25 mg daily, (irbesartan  300 mg daily listed but apparently not taking), hydralazine  25 mg twice a day, omeprazole  40 mg twice a day, potassium 10 mEq daily, fish oil tablets, and a baby aspirin. Primary CARE has discontinued clonidine .  She lives alone but plans to move into an apartment at [facility]. She remains active, going out daily, but finds household chores increasingly difficult. She experiences some shortness of breath with exertion and notes that neuropathy limits her walking distance.    Objective   She is in the process of moving into a assisted living facility.  Essentially she is able to live independently and do activities of daily living including driving and cooking as well as managing her finances but she is having a hard time with the upkeep of the house.  [place] getting [place].  Current  Meds  Medication Sig   aspirin 81 MG tablet Take 81 mg by mouth daily.   atorvastatin  (LIPITOR) 40 MG tablet TAKE ONE TABLET BY MOUTH ONCE A DAY   calcium -vitamin D (OSCAL WITH D) 250-125 MG-UNIT per tablet Take 1 tablet by mouth 2 (two) times daily.   carvedilol  (COREG ) 6.25 MG tablet TAKE ONE TABLET BY MOUTH TWICE A DAY WITH A  MEAL   felodipine  (PLENDIL ) 5 MG 24 hr tablet TAKE ONE TABLET (5 MG TOTAL) BY MOUTH DAILY.   gabapentin  (NEURONTIN ) 100 MG capsule Take 1 capsule (100 mg total) by mouth 3 (three) times daily as needed.   hydrALAZINE  (APRESOLINE ) 25 MG tablet TAKE TWO TABLETS (50 MG TOTAL) BY MOUTH TWO (TWO) TIMES DAILY.   hydrochlorothiazide  (HYDRODIURIL ) 25 MG tablet Take 1 tablet (25 mg total) by mouth daily.   Omega-3 Fatty Acids (FISH OIL) 1000 MG CAPS Take 1,000 mg by mouth daily.   omeprazole  (PRILOSEC) 40 MG capsule Take 1 capsule (40 mg total) by mouth in the morning and at bedtime.   potassium chloride  (KLOR-CON ) 10 MEQ tablet Take 1 tablet (10 mEq total) by mouth daily.   [DISCONTINUED] cloNIDine  (CATAPRES ) 0.2 MG tablet TAKE ONE TABLET BY MOUTH THREE TIMES DAILY. STOP ATENOLOL  AND CLONIDINE  PATCH.  NOT TAKING   [DISCONTINUED] irbesartan  (AVAPRO ) 300 MG tablet TAKE ONE TABLET (300 MG TOTAL) BY MOUTH DAILY.   NOT TAKING    Studies Reviewed: Aaron Aas   EKG Interpretation Date/Time:  Thursday July 17 2023 08:23:15 EDT Ventricular Rate:  75 PR Interval:  152 QRS Duration:  150 QT Interval:  398 QTC Calculation: 444 R Axis:   -57  Text Interpretation: Right bundle branch block Left anterior fascicular block Bifascicular block Minimal voltage criteria for LVH, may be normal variant ( R in aVL   No significant change since last tracing Confirmed by Randene Bustard ([id number]) on 07/17/2023 8:28:52 AM     LABS Total cholesterol: 133 mg/dL (02/270) HDL: 74 mg/dL (53/6644) Triglycerides: 78 mg/dL (04/4740) LDL: 46 mg/dL (59/5638)  RADIOLOGY Carotid Doppler: narrowing in carotid arteries  DIAGNOSTIC EKG from [facility] family medicine showed sinus rhythm, rate 92 bpm.  RBBB with LAFB.  Borderline LVH repolarization abnormalities from RBBB-T wave versions in precordial leads.- compared to 09/04/2021, heart rate slower but otherwise bifascicular block still present. (04/15/2023) ECHO: EF 60 to 65%.  No RWMA.   Indeterminate diastolic parameters.  AoV sclerosis with no stenosis.  Otherwise normal valves.  Normal RAP and RAP.  (09/20/2021) Stress Echo: Only ambulate achieved 76% max.  Heart rate with mild diagnostic EKG changes.  However, no exercise related wall motion abnormalities at submaximal stress.  (05/27/2012) CATH: [facility]) angiographic normal coronary arteries (03/24/1997) Carotid Dopplers: Heterogeneous partially calcified plaque in bilateral carotid bifurcation-50 to 69% stenosis.  (12/13/2022)   Risk Assessment/Calculations:          Physical Exam:   VS:  BP (!) 168/68 (BP Location: Right Arm, Patient Position: Sitting, Cuff Size: Normal)   Pulse 75   Ht 5' 1 (1.549 m)   Wt 109 lb (49.4 kg)   SpO2 97%   BMI 20.60 kg/m   blood pressure was rechecked and was somewhat similar-but she had not taken her medications yet this morning.  She stated that earlier this morning her blood pressures look better. Wt Readings from Last 3 Encounters:  07/17/23 109 lb (49.4 kg)  06/23/23 106 lb (48.1 kg)  04/15/23 106 lb (48.1 kg)    GEN: Well  nourished, well groomed in no acute distress; healthy-appearing.  Relatively poor historian.  Difficulty answering questions. NECK: No JVD; No carotid bruits CARDIAC: Normal S1, S2; RRR, no murmurs, rubs, gallops RESPIRATORY:  Clear to auscultation without rales, wheezing or rhonchi ; nonlabored, good air movement. ABDOMEN: Soft, non-tender, non-distended EXTREMITIES:  No edema; No deformity     ASSESSMENT AND PLAN: .    Problem List Items Addressed This Visit       Cardiology Problems   Bilateral carotid artery stenosis - Primary (Chronic)   Bilateral carotids with 60 to 70% stenosis. - Adequately treat blood pressure.   - Continue atorvastatin  40 mg and aspirin 81 mg daily       Relevant Orders   EKG 12-Lead (Completed)   Labile hypertension (Chronic)   Although current pressure is high, she is also noted that sometimes her runs low, with readings  as low as 112 mmHg systolic. Dizziness possibly related to hypotension.  Clonidine  previously discontinued due to risk in elderly.   Would be reluctant to be overly aggressively treat hypertension given her history of hypotension.  - Ensure consistent medication intake for accurate BP assessment. - Continue carvedilol  6.25 mg twice daily and felodipine  5 mg daily, along with HCTZ 25 mg daily and hydralazine  50 mg twice daily - Hydralazine  may be used as needed for additional doses for high blood pressure with sustained readings over 160 mmHg or omitted if BP <110 mmHg.         Relevant Orders   EKG 12-Lead (Completed)     Other   Chest pain of uncertain etiology   Uncertain etiology of chest pain and uncertain if it is truly chest pain.  Disc pressure in the epigastric region not necessarily associate with exertion.  We talked about whether or not she would be interested in aggressive evaluations.  She provided inconsistent answers as to whether or not she would be interested in stress test or other invasive studies.  I think she was even getting somewhat confused.  (Prolonged discussion)  Recommended that she discuss her symptoms with her daughter and her family members to get their input.  Will reassess symptoms fall and consider testing at that time if indicated.      Dyspnea   Somewhat inconsistent story of dyspnea.  Would like to reassess in a month to see how she is feeling doing before we order tests.      Relevant Orders   EKG 12-Lead (Completed)   GERD (gastroesophageal reflux disease)   Intermittent epigastric pressure improved with increased omeprazole . Symptoms do not seem consistent with angina. - Continue omeprazole  40 mg twice daily.      Other Visit Diagnoses       Chest pressure       Relevant Orders   EKG 12-Lead (Completed)            Follow-Up: Return in about 3 months (around 10/17/2023) for Routine follow up with me to readdress  symptoms.     Signed, Arleen Lacer, MD, MS Randene Bustard, M.D., M.S. Interventional Chartered certified accountant  Pager # [phone]

## 2023-07-18 ENCOUNTER — Other Ambulatory Visit: Payer: Self-pay | Admitting: Family Medicine

## 2023-07-18 NOTE — Telephone Encounter (Signed)
 Requested Prescriptions  Pending Prescriptions Disp Refills   irbesartan  (AVAPRO ) 300 MG tablet [Pharmacy Med Name: IRBESARTAN  300MG  TABLET] 90 tablet 0    Sig: TAKE ONE TABLET (300 MG TOTAL) BY MOUTH DAILY.     Cardiovascular:  Angiotensin Receptor Blockers Failed - 07/18/2023 12:00 PM      Failed - Last BP in normal range    BP Readings from Last 1 Encounters:  07/17/23 (!) 168/68         Failed - Valid encounter within last 6 months    Recent Outpatient Visits           3 weeks ago Prediabetes   [place] [facility] Cheril Cork, Cisco Crest, MD   3 months ago Carpal tunnel syndrome of right wrist   [place] [facility] Cheril Cork, Cisco Crest, MD   7 months ago Primary hypertension   [place] [facility] Austine Lefort, MD   1 year ago Allergic contact dermatitis due to drugs in contact with skin   [place] Austine Lefort, MD   1 year ago Subacute sinusitis, unspecified location   [place] [facility] Austine Lefort, MD       Future Appointments             In 3 months Arleen Lacer, MD [facility] - Cr in normal range and within 180 days    Creat  Date Value Ref Range Status  06/23/2023 0.76 0.60 - 0.95 mg/dL Final         Passed - K in normal range and within 180 days    Potassium  Date Value Ref Range Status  06/23/2023 4.2 3.5 - 5.3 mmol/L Final         Passed - Patient is not pregnant

## 2023-07-23 ENCOUNTER — Encounter: Payer: Self-pay | Admitting: Cardiology

## 2023-07-23 DIAGNOSIS — R079 Chest pain, unspecified: Secondary | ICD-10-CM | POA: Insufficient documentation

## 2023-07-23 NOTE — Assessment & Plan Note (Signed)
 Intermittent epigastric pressure improved with increased omeprazole . Symptoms do not seem consistent with angina. - Continue omeprazole  40 mg twice daily.

## 2023-07-23 NOTE — Assessment & Plan Note (Signed)
 Bilateral carotids with 60 to 70% stenosis. - Adequately treat blood pressure.   - Continue atorvastatin  40 mg and aspirin 81 mg daily

## 2023-07-23 NOTE — Assessment & Plan Note (Signed)
 Somewhat inconsistent story of dyspnea.  Would like to reassess in a month to see how she is feeling doing before we order tests.

## 2023-07-23 NOTE — Assessment & Plan Note (Signed)
 Although current pressure is high, she is also noted that sometimes her runs low, with readings as low as 112 mmHg systolic. Dizziness possibly related to hypotension.  Clonidine  previously discontinued due to risk in elderly.   Would be reluctant to be overly aggressively treat hypertension given her history of hypotension.  - Ensure consistent medication intake for accurate BP assessment. - Continue carvedilol  6.25 mg twice daily and felodipine  5 mg daily, along with HCTZ 25 mg daily and hydralazine  50 mg twice daily - Hydralazine  may be used as needed for additional doses for high blood pressure with sustained readings over 160 mmHg or omitted if BP <110 mmHg.

## 2023-07-23 NOTE — Assessment & Plan Note (Signed)
 Uncertain etiology of chest pain and uncertain if it is truly chest pain.  Disc pressure in the epigastric region not necessarily associate with exertion.  We talked about whether or not she would be interested in aggressive evaluations.  She provided inconsistent answers as to whether or not she would be interested in stress test or other invasive studies.  I think she was even getting somewhat confused.  (Prolonged discussion)  Recommended that she discuss her symptoms with her daughter and her family members to get their input.  Will reassess symptoms fall and consider testing at that time if indicated.

## 2023-07-25 ENCOUNTER — Other Ambulatory Visit

## 2023-07-25 DIAGNOSIS — I6523 Occlusion and stenosis of bilateral carotid arteries: Secondary | ICD-10-CM

## 2023-07-26 LAB — BASIC METABOLIC PANEL WITHOUT GFR
BUN: 11 mg/dL (ref 7–25)
CO2: 31 mmol/L (ref 20–32)
Calcium: 9.8 mg/dL (ref 8.6–10.4)
Chloride: 97 mmol/L — ABNORMAL LOW (ref 98–110)
Creat: 0.82 mg/dL (ref 0.60–0.95)
Glucose, Bld: 97 mg/dL (ref 65–99)
Potassium: 4.2 mmol/L (ref 3.5–5.3)
Sodium: 137 mmol/L (ref 135–146)

## 2023-07-28 ENCOUNTER — Ambulatory Visit: Payer: Self-pay | Admitting: Family Medicine

## 2023-08-13 ENCOUNTER — Ambulatory Visit: Payer: Self-pay

## 2023-08-13 NOTE — Telephone Encounter (Signed)
 FYI Only or Action Required?: Action required by provider: clinical question for provider and update on patient condition. Patient seeking medication adjustment and/or advice for her BP. Pt reports fluctuating BP readings. Yesterdat BP was 128/63 then an hour later it was 113/51. Pt had not taken any medications at that time. Patient says that she's noticed this trend for the past month and has kept a log, but wants to know if some parameters can be established so she will know when to hold/take her medication  Patient was last seen in primary care on 06/23/2023 by Brittany Butler DASEN, MD.  Called Nurse Triage reporting Hypotension.  Symptoms began about a month ago.  Interventions attempted: Prescription medications: Carvedilol , hydralazine , irbesartan , hctz. Patient says that sometines she will hold the medication if she gets low readings  Symptoms are: stable.  Triage Disposition: See PCP When Office is Open (Within 3 Days)- No PCP appt avail within this time. Pt declining visit with another provider, prefers Dr. Duanne only. Went ahead and scheduled for 7/21 as a f/u but will likely need a telephone visit or nurse call to discuss dosing.   Patient/caregiver understands and will follow disposition?: Yes, but will wait        Copied from CRM [id number]. Topic: Clinical - Red Word Triage >> Aug 13, 2023  9:08 AM Brittany Levy wrote: [facility] that prompted transfer to Nurse Triage: Patient calling, states her blood pressure has been fluctuating from very low to high readings. Reason for Disposition  Wants doctor to measure BP  Answer Assessment - Initial Assessment Questions 1. BLOOD PRESSURE: What is the blood pressure? Did you take at least two measurements 5 minutes apart?     128/63 then 113/51  2. ONSET: When did you take your blood pressure?     Yesterday  3. HOW: How did you obtain the blood pressure? (e.g., visiting nurse, automatic home BP monitor)     Automatic home BP  monitor  4. HISTORY: Do you have a history of low blood pressure? What is your blood pressure normally?     No, BP is usually high  5. MEDICINES: Are you taking any medications for blood pressure? If Yes, ask: Have they been changed recently?     Yes, Carvedilol , Hydralazine , Irbesartan , and hydrochlorothiazide   6. PULSE RATE: Do you know what your pulse rate is?      60s to 70s  7. OTHER SYMPTOMS: Have you been sick recently? Have you had a recent injury?     No  8. PREGNANCY: Is there any chance you are pregnant? When was your last menstrual period?     No  Protocols used: Blood Pressure - Low-A-AH

## 2023-08-13 NOTE — Telephone Encounter (Signed)
 Lvm for pt to call us back.

## 2023-08-21 ENCOUNTER — Other Ambulatory Visit: Payer: Self-pay | Admitting: Family Medicine

## 2023-08-21 DIAGNOSIS — I10 Essential (primary) hypertension: Secondary | ICD-10-CM

## 2023-08-21 DIAGNOSIS — I6523 Occlusion and stenosis of bilateral carotid arteries: Secondary | ICD-10-CM

## 2023-08-22 NOTE — Telephone Encounter (Signed)
 Requested Prescriptions  Pending Prescriptions Disp Refills   carvedilol  (COREG ) 6.25 MG tablet [Pharmacy Med Name: CARVEDILOL  6.25MG  TABLET] 180 tablet 1    Sig: TAKE ONE TABLET BY MOUTH TWICE A DAY WITH A MEAL     Cardiovascular: Beta Blockers 3 Failed - 08/22/2023  2:03 PM      Failed - Last BP in normal range    BP Readings from Last 1 Encounters:  07/17/23 (!) 168/68         Passed - Cr in normal range and within 360 days    Creat  Date Value Ref Range Status  07/25/2023 0.82 0.60 - 0.95 mg/dL Final         Passed - AST in normal range and within 360 days    AST  Date Value Ref Range Status  06/23/2023 18 10 - 35 U/L Final         Passed - ALT in normal range and within 360 days    ALT  Date Value Ref Range Status  06/23/2023 18 6 - 29 U/L Final         Passed - Last Heart Rate in normal range    Pulse Readings from Last 1 Encounters:  07/17/23 75         Passed - Valid encounter within last 6 months    Recent Outpatient Visits           2 months ago Prediabetes   [place] [facility] Duanne Butler DASEN, MD   4 months ago Carpal tunnel syndrome of right wrist   [place] [facility] Duanne, Butler DASEN, MD   8 months ago Primary hypertension   [place] [facility] Duanne Butler DASEN, MD   1 year ago Allergic contact dermatitis due to drugs in contact with skin   [place] [facility] Duanne Butler DASEN, MD   1 year ago Subacute sinusitis, unspecified location   [facility] Pickard, Butler DASEN, MD       Future Appointments             In 2 months Anner Alm ORN, MD [facility] at [facility]

## 2023-08-25 ENCOUNTER — Ambulatory Visit: Admitting: Family Medicine

## 2023-08-25 ENCOUNTER — Encounter: Payer: Self-pay | Admitting: Family Medicine

## 2023-08-25 VITALS — BP 148/76 | HR 80 | Temp 98.6°F | Ht 61.0 in | Wt 107.0 lb

## 2023-08-25 DIAGNOSIS — I10 Essential (primary) hypertension: Secondary | ICD-10-CM

## 2023-08-25 DIAGNOSIS — F5101 Primary insomnia: Secondary | ICD-10-CM | POA: Diagnosis not present

## 2023-08-25 MED ORDER — CLONAZEPAM 0.5 MG PO TABS: ORAL_TABLET | ORAL | 2 refills | Status: AC

## 2023-08-25 MED ORDER — GABAPENTIN 100 MG PO CAPS: 100.0000 mg | ORAL_CAPSULE | Freq: Three times a day (TID) | ORAL | 3 refills | Status: AC | PRN

## 2023-08-25 NOTE — Progress Notes (Signed)
 Subjective:    Patient ID: Brittany Levy, female    DOB: 1934-11-04, 89 y.o.   MRN: [id number]  Hypertension    Patient called about a week ago stating that her blood pressure was running low.  At that time, she reported systolic blood pressures less than 110.  We had the patient discontinue hydralazine  and felodipine .  We encouraged her to continue her other medicines which include irbesartan , carvedilol , and hydrochlorothiazide .  The patient states that since she stopped hydralazine  and felodipine , her blood pressure has risen.  She is consistently seeing systolic blood pressures at 160.  She denies any chest pain or shortness of breath.  Her lowest blood pressure is still greater than 140 systolic Past Medical History:  Diagnosis Date   Bilateral carotid artery stenosis    Chest pain    GERD (gastroesophageal reflux disease)    Hypertension    Insomnia    Osteoporosis of femur without pathological fracture    Past Surgical History:  Procedure Laterality Date   ABDOMINAL HYSTERECTOMY     BREAST BIOPSY Right 05/07/2016   FRAGMENTS OF LYMPH NODE   Current Outpatient Medications on File Prior to Visit  Medication Sig Dispense Refill   aspirin 81 MG tablet Take 81 mg by mouth daily.     atorvastatin  (LIPITOR) 40 MG tablet TAKE ONE TABLET BY MOUTH ONCE A DAY 90 tablet 3   calcium -vitamin D (OSCAL WITH D) 250-125 MG-UNIT per tablet Take 1 tablet by mouth 2 (two) times daily.     carvedilol  (COREG ) 6.25 MG tablet TAKE ONE TABLET BY MOUTH TWICE A DAY WITH A MEAL 180 tablet 1   hydrochlorothiazide  (HYDRODIURIL ) 25 MG tablet Take 1 tablet (25 mg total) by mouth daily. 90 tablet 3   irbesartan  (AVAPRO ) 300 MG tablet TAKE ONE TABLET (300 MG TOTAL) BY MOUTH DAILY. 90 tablet 0   Omega-3 Fatty Acids (FISH OIL) 1000 MG CAPS Take 1,000 mg by mouth daily.     omeprazole  (PRILOSEC) 40 MG capsule Take 1 capsule (40 mg total) by mouth in the morning and at bedtime. 180 capsule 3   potassium  chloride (KLOR-CON ) 10 MEQ tablet Take 1 tablet (10 mEq total) by mouth daily. 90 tablet 0   felodipine  (PLENDIL ) 5 MG 24 hr tablet TAKE ONE TABLET (5 MG TOTAL) BY MOUTH DAILY. (Patient not taking: Reported on 08/25/2023) 90 tablet 0   hydrALAZINE  (APRESOLINE ) 25 MG tablet TAKE TWO TABLETS (50 MG TOTAL) BY MOUTH TWO (TWO) TIMES DAILY. (Patient not taking: Reported on 08/25/2023) 360 tablet 0   Current Facility-Administered Medications on File Prior to Visit  Medication Dose Route Frequency Provider Last Rate Last Admin   denosumab  (PROLIA ) injection 60 mg  60 mg Subcutaneous Once Ellice Boultinghouse T, MD       No Known Allergies Social History   Socioeconomic History   Marital status: Widowed    Spouse name: Not on file   Number of children: 1   Years of education: Not on file   Highest education level: Not on file  Occupational History   Not on file  Tobacco Use   Smoking status: Never   Smokeless tobacco: Never  Substance and Sexual Activity   Alcohol use: Yes    Comment: wine occasionally   Drug use: No   Sexual activity: Not on file  Other Topics Concern   Not on file  Social History Narrative   Husband passed in 2021.   1 daughter.   2  grand children.   Social Drivers of Corporate investment banker Strain: Low Risk  (02/13/2022)   Overall Financial Resource Strain (CARDIA)    Difficulty of Paying Living Expenses: Not hard at all  Food Insecurity: No Food Insecurity (02/13/2022)   Hunger Vital Sign    Worried About Running Out of Food in the Last Year: Never true    Ran Out of Food in the Last Year: Never true  Transportation Needs: No Transportation Needs (02/13/2022)   PRAPARE - Administrator, Civil Service (Medical): No    Lack of Transportation (Non-Medical): No  Physical Activity: Sufficiently Active (02/13/2022)   Exercise Vital Sign    Days of Exercise per Week: 5 days    Minutes of Exercise per Session: 30 min  Stress: No Stress Concern Present  (02/13/2022)   [employer] of Occupational Health - Occupational Stress Questionnaire    Feeling of Stress : Not at all  Social Connections: Socially Integrated (02/13/2022)   Social Connection and Isolation Panel    Frequency of Communication with Friends and Family: More than three times a week    Frequency of Social Gatherings with Friends and Family: More than three times a week    Attends Religious Services: More than 4 times per year    Active Member of [employer] or Organizations: Yes    Attends Banker Meetings: More than 4 times per year    Marital Status: Married  Catering manager Violence: Not At Risk (02/13/2022)   Humiliation, Afraid, Rape, and Kick questionnaire    Fear of Current or Ex-Partner: No    Emotionally Abused: No    Physically Abused: No    Sexually Abused: No     Review of Systems  All other systems reviewed and are negative.      Objective:   Physical Exam Vitals reviewed.  Constitutional:      Appearance: Normal appearance.  Cardiovascular:     Rate and Rhythm: Normal rate and regular rhythm.     Heart sounds: Normal heart sounds. No murmur heard.    No friction rub. No gallop.  Pulmonary:     Effort: Pulmonary effort is normal. No respiratory distress.     Breath sounds: Normal breath sounds. No wheezing or rhonchi.  Musculoskeletal:     Right lower leg: No edema.     Left lower leg: No edema.  Neurological:     Mental Status: She is alert.           Assessment & Plan:  Primary insomnia  Primary hypertension I have recommended that the patient resume felodipine  and hydralazine .  I have recommended that if the patient's blood pressure is less than 120, she can skip the next dose of hydralazine  which she is currently taking 50 mg twice a day.  If her blood pressures between 120-140 systolic, her blood pressure is going excellent and she should continue her medications as prescribed.

## 2023-09-12 ENCOUNTER — Other Ambulatory Visit: Payer: Self-pay | Admitting: Family Medicine

## 2023-09-12 DIAGNOSIS — I10 Essential (primary) hypertension: Secondary | ICD-10-CM

## 2023-09-15 ENCOUNTER — Other Ambulatory Visit: Payer: Self-pay | Admitting: Family Medicine

## 2023-09-15 DIAGNOSIS — I10 Essential (primary) hypertension: Secondary | ICD-10-CM

## 2023-09-15 MED ORDER — HYDRALAZINE HCL 25 MG PO TABS: 50.0000 mg | ORAL_TABLET | Freq: Two times a day (BID) | ORAL | 0 refills | Status: DC

## 2023-09-15 NOTE — Telephone Encounter (Addendum)
 Patient called and advised I'm not sure why the hydralazine  was not refilled because it's was due to be refilled 08/15/23 since it was refilled last 05/16/23 #360/0 refills. Advised I will send in the refill for her today. She says that sometimes she doesn't take it in the morning due to her BP being low. She says for example today it was 136/73 when she got up and around 1000 it was 131/59, so she would normally not take it because that's a low BP. Advised that is not low. She says sometimes she is dizzy at that BP, but not today and not all the time. I advised she may need parameters. She says she has a parameter by Dr. Duanne, 120 she says. I read the OV note that stated below 120 systolic skip the next dose of Hydralazine  and if between 120-140 systolic take the hyralazine. She verbalized understanding of that and says that's what he said and she has it written down. I advised the next time she has a dizzy spell to call the office to speak to a triage nurse not let it pass on it's own. She says she will do that.     Copied from CRM [id number]. Topic: Clinical - Prescription Issue >> Sep 15, 2023  2:46 PM Gustabo D wrote: hydrALAZINE  (APRESOLINE ) 25 MG tablet-Patient wants to know why her medication  was refused   Requested Prescriptions  Pending Prescriptions Disp Refills   hydrALAZINE  (APRESOLINE ) 25 MG tablet 360 tablet 0     Cardiovascular:  Vasodilators Failed - 09/15/2023  5:59 PM      Failed - ANA Screen, Ifa, Serum in normal range and within 360 days    No results found for: ANA, ANATITER, LABANTI       Failed - Last BP in normal range    BP Readings from Last 1 Encounters:  08/25/23 (!) 148/76         Passed - HCT in normal range and within 360 days    HCT  Date Value Ref Range Status  04/15/2023 36.0 35.0 - 45.0 % Final         Passed - HGB in normal range and within 360 days    Hemoglobin  Date Value Ref Range Status  04/15/2023 11.9 11.7 - 15.5 g/dL Final          Passed - RBC in normal range and within 360 days    RBC  Date Value Ref Range Status  04/15/2023 4.03 3.80 - 5.10 Million/uL Final         Passed - WBC in normal range and within 360 days    WBC  Date Value Ref Range Status  04/15/2023 9.1 3.8 - 10.8 Thousand/uL Final         Passed - PLT in normal range and within 360 days    Platelets  Date Value Ref Range Status  04/15/2023 166 140 - 400 Thousand/uL Final         Passed - Valid encounter within last 12 months    Recent Outpatient Visits           3 weeks ago Primary insomnia   [place] [facility] Duanne Butler DASEN, MD   2 months ago Prediabetes   [place] [facility] Duanne, Butler DASEN, MD   5 months ago Carpal tunnel syndrome of right wrist    [facility] Duanne, Butler DASEN, MD   9 months ago Primary  hypertension   [place] [facility] Duanne Butler DASEN, MD   1 year ago Allergic contact dermatitis due to drugs in contact with skin   [place] [facility] Pickard, Butler DASEN, MD       Future Appointments             In 1 month Anner Alm ORN, MD [facility]

## 2023-09-22 DIAGNOSIS — J301 Allergic rhinitis due to pollen: Secondary | ICD-10-CM | POA: Diagnosis not present

## 2023-09-22 DIAGNOSIS — H6982 Other specified disorders of Eustachian tube, left ear: Secondary | ICD-10-CM | POA: Diagnosis not present

## 2023-09-22 DIAGNOSIS — H6123 Impacted cerumen, bilateral: Secondary | ICD-10-CM | POA: Diagnosis not present

## 2023-10-02 ENCOUNTER — Other Ambulatory Visit: Payer: Self-pay | Admitting: Family Medicine

## 2023-10-08 ENCOUNTER — Other Ambulatory Visit: Payer: Self-pay | Admitting: Family Medicine

## 2023-10-22 ENCOUNTER — Other Ambulatory Visit: Payer: Self-pay | Admitting: Family Medicine

## 2023-10-23 ENCOUNTER — Ambulatory Visit: Admitting: Cardiology

## 2023-11-13 ENCOUNTER — Encounter: Admitting: Family Medicine

## 2023-11-25 ENCOUNTER — Encounter: Admitting: Family Medicine

## 2023-11-27 ENCOUNTER — Ambulatory Visit (INDEPENDENT_AMBULATORY_CARE_PROVIDER_SITE_OTHER): Admitting: *Deleted

## 2023-11-27 DIAGNOSIS — Z0000 Encounter for general adult medical examination without abnormal findings: Secondary | ICD-10-CM

## 2023-11-27 NOTE — Progress Notes (Signed)
 Subjective:   Brittany Levy is a 89 y.o. female who presents for Medicare Annual (Subsequent) preventive examination.  Visit Complete: In person    Cardiac Risk Factors include: advanced age (>59men, >88 women)     Objective:    There were no vitals filed for this visit. There is no height or weight on file to calculate BMI.     11/27/2023    9:42 AM 02/13/2022    9:46 AM 12/15/2020    9:12 AM  Advanced Directives  Does Patient Have a Medical Advance Directive? No Yes Yes  Type of Special educational needs teacher of [place];Living will Living will  Copy of Healthcare Power of Attorney in Chart?  No - copy requested   Would patient like information on creating a medical advance directive?   No - Patient declined    Current Medications (verified) Outpatient Encounter Medications as of 11/27/2023  Medication Sig   aspirin 81 MG tablet Take 81 mg by mouth daily.   atorvastatin  (LIPITOR) 40 MG tablet TAKE ONE TABLET BY MOUTH ONCE A DAY   carvedilol  (COREG ) 6.25 MG tablet TAKE ONE TABLET BY MOUTH TWICE A DAY WITH A MEAL   clonazePAM  (KLONOPIN ) 0.5 MG tablet TAKE ONE TABLET BY MOUTH TWICE A DAY AS NEEDED FOR ANXIETY   felodipine  (PLENDIL ) 5 MG 24 hr tablet TAKE ONE TABLET (5 MG TOTAL) BY MOUTH DAILY.   gabapentin  (NEURONTIN ) 100 MG capsule Take 1 capsule (100 mg total) by mouth 3 (three) times daily as needed.   hydrALAZINE  (APRESOLINE ) 25 MG tablet Take 2 tablets (50 mg total) by mouth in the morning and at bedtime.   hydrochlorothiazide  (HYDRODIURIL ) 25 MG tablet Take 1 tablet (25 mg total) by mouth daily.   irbesartan  (AVAPRO ) 300 MG tablet TAKE ONE TABLET (300 MG TOTAL) BY MOUTH DAILY.   Omega-3 Fatty Acids (FISH OIL) 1000 MG CAPS Take 1,000 mg by mouth daily.   potassium chloride  (KLOR-CON ) 10 MEQ tablet TAKE ONE TABLET (10 MEQ TOTAL) BY MOUTH DAILY.   calcium -vitamin D (OSCAL WITH D) 250-125 MG-UNIT per tablet Take 1 tablet by mouth 2 (two) times daily. (Patient not  taking: Reported on 11/27/2023)   omeprazole  (PRILOSEC) 40 MG capsule Take 1 capsule (40 mg total) by mouth in the morning and at bedtime. (Patient not taking: Reported on 11/27/2023)   Facility-Administered Encounter Medications as of 11/27/2023  Medication   denosumab  (PROLIA ) injection 60 mg    Allergies (verified) Patient has no known allergies.   History: Past Medical History:  Diagnosis Date   Bilateral carotid artery stenosis    Chest pain    GERD (gastroesophageal reflux disease)    Hypertension    Insomnia    Osteoporosis of femur without pathological fracture    Past Surgical History:  Procedure Laterality Date   ABDOMINAL HYSTERECTOMY     BREAST BIOPSY Right 05/07/2016   FRAGMENTS OF LYMPH NODE   Family History  Problem Relation Age of Onset   Congestive Heart Failure Mother    Stroke Father    Hypertension Father    Breast cancer Sister 83   Heart disease Brother    Heart disease Brother    Breast cancer Maternal Aunt 27   Breast cancer Paternal Aunt        after menopause   Social History   Socioeconomic History   Marital status: Widowed    Spouse name: Not on file   Number of children: 1   Years of  education: Not on file   Highest education level: Not on file  Occupational History   Not on file  Tobacco Use   Smoking status: Never   Smokeless tobacco: Never  Substance and Sexual Activity   Alcohol use: Yes    Comment: wine occasionally   Drug use: No   Sexual activity: Not Currently  Other Topics Concern   Not on file  Social History Narrative   Husband passed in 2021.   1 daughter.   2 grand children.   Social Drivers of Corporate investment banker Strain: Low Risk  (11/27/2023)   Overall Financial Resource Strain (CARDIA)    Difficulty of Paying Living Expenses: Not hard at all  Food Insecurity: No Food Insecurity (11/27/2023)   Hunger Vital Sign    Worried About Running Out of Food in the Last Year: Never true    Ran Out of Food  in the Last Year: Never true  Transportation Needs: No Transportation Needs (11/27/2023)   PRAPARE - Administrator, Civil Service (Medical): No    Lack of Transportation (Non-Medical): No  Physical Activity: Inactive (11/27/2023)   Exercise Vital Sign    Days of Exercise per Week: 0 days    Minutes of Exercise per Session: 0 min  Stress: Stress Concern Present (11/27/2023)   [employer] of Occupational Health - Occupational Stress Questionnaire    Feeling of Stress: To some extent  Social Connections: Socially Integrated (11/27/2023)   Social Connection and Isolation Panel    Frequency of Communication with Friends and Family: More than three times a week    Frequency of Social Gatherings with Friends and Family: More than three times a week    Attends Religious Services: More than 4 times per year    Active Member of [employer] or Organizations: Yes    Attends Engineer, structural: More than 4 times per year    Marital Status: Married    Tobacco Counseling Counseling given: Not Answered   Clinical Intake:  Pre-visit preparation completed: Yes  Pain : No/denies pain     Diabetes: No  How often do you need to have someone help you when you read instructions, pamphlets, or other written materials from your doctor or pharmacy?: 1 - Never  Interpreter Needed?: No  Information entered by :: Mliss Graff LPN   Activities of Daily Living    11/27/2023    9:37 AM  In your present state of health, do you have any difficulty performing the following activities:  Hearing? 1  Vision? 0  Difficulty concentrating or making decisions? 0  Walking or climbing stairs? 0  Dressing or bathing? 0  Doing errands, shopping? 0  Preparing Food and eating ? N  Using the Toilet? N  In the past six months, have you accidently leaked urine? Y  Do you have problems with loss of bowel control? N  Managing your Medications? N  Managing your Finances? N  Housekeeping  or managing your Housekeeping? N    Patient Care Team: Duanne Butler DASEN, MD as PCP - General (Family Medicine) Nicholaus Sherlean CROME, [facility] (Inactive) as Pharmacist (Pharmacist)  Indicate any recent Medical Services you may have received from other than [facility] providers in the past year (date may be approximate).     Assessment:   This is a routine wellness examination for [facility].  Hearing/Vision screen Hearing Screening - Comments:: Bilateral hearing aids Vision Screening - Comments:: [facility] Up to date   Goals  Addressed             This Visit's Progress    Patient Stated       Continue current lifestyle       Depression Screen    11/27/2023    9:41 AM 06/07/2022    8:08 AM 04/12/2022    3:55 PM 02/13/2022    9:40 AM 12/20/2021   11:53 AM 11/30/2021    8:01 AM 11/27/2021    7:53 AM  PHQ 2/9 Scores  PHQ - 2 Score 0 0 0 0 0 0 0  PHQ- 9 Score 0 0 0 0 0 0 0    Fall Risk    11/27/2023    9:34 AM 06/07/2022    8:08 AM 04/12/2022    3:55 PM 02/13/2022    9:47 AM 12/20/2021   11:53 AM  Fall Risk   Falls in the past year? 1 0 0 0 0  Number falls in past yr: 1 0 0 0 0  Injury with Fall? 0 0 0 0 0  Risk for fall due to :  No Fall Risks No Fall Risks No Fall Risks No Fall Risks  Follow up Falls evaluation completed;Education provided;Falls prevention discussed Falls prevention discussed Falls prevention discussed Falls prevention discussed  Falls prevention discussed      Data saved with a previous flowsheet row definition    MEDICARE RISK AT HOME: Medicare Risk at Home Any stairs in or around the home?: No If so, are there any without handrails?: No Home free of loose throw rugs in walkways, pet beds, electrical cords, etc?: Yes Adequate lighting in your home to reduce risk of falls?: Yes Life alert?: No Use of a cane, walker or w/c?: No Grab bars in the bathroom?: Yes Shower chair or bench in shower?: No Elevated toilet seat or a handicapped toilet?: Yes  TIMED UP  AND GO:  Was the test performed?  Yes  Length of time to ambulate 10 feet: 10 sec Gait steady and fast without use of assistive device    Cognitive Function:        11/27/2023    9:38 AM 02/13/2022    9:49 AM 12/15/2020    9:19 AM  6CIT Screen  What Year? 0 points 0 points 0 points  What month? 0 points 0 points 0 points  What time? 0 points 0 points 0 points  Count back from 20 0 points 0 points 0 points  Months in reverse 0 points 0 points 0 points  Repeat phrase 4 points 2 points 2 points  Total Score 4 points 2 points 2 points    Immunizations Immunization History  Administered Date(s) Administered   Fluad Quad(high Dose 65+) 10/08/2018   Influenza Split 10/18/2010   Influenza,inj,Quad PF,6+ Mos 10/08/2012, 10/06/2013, 10/09/2016, 11/14/2017   Influenza-Unspecified 03/09/2015, 11/14/2020, 11/02/2021   PFIZER(Purple Top)SARS-COV-2 Vaccination 02/21/2019, 03/11/2019   Pneumococcal Conjugate-13 02/12/2013   Pneumococcal Polysaccharide-23 02/05/1999, 02/15/2014   Zoster Recombinant(Shingrix) 06/26/2021   Zoster, Live 09/18/2011    TDAP status: Due, Education has been provided regarding the importance of this vaccine. Advised may receive this vaccine at local pharmacy or Health Dept. Aware to provide a copy of the vaccination record if obtained from local pharmacy or Health Dept. Verbalized acceptance and understanding.  Flu Vaccine status: Due, Education has been provided regarding the importance of this vaccine. Advised may receive this vaccine at local pharmacy or Health Dept. Aware to provide a copy of the vaccination record if  obtained from local pharmacy or Health Dept. Verbalized acceptance and understanding.  Pneumococcal vaccine status: Up to date  Covid-19 vaccine status: Information provided on how to obtain vaccines.   Qualifies for Shingles Vaccine? Yes   Zostavax completed No   Shingrix Completed?: No.    Education has been provided regarding the importance  of this vaccine. Patient has been advised to call insurance company to determine out of pocket expense if they have not yet received this vaccine. Advised may also receive vaccine at local pharmacy or Health Dept. Verbalized acceptance and understanding.  Screening Tests Health Maintenance  Topic Date Due   Zoster Vaccines- Shingrix (2 of 2) 08/21/2021   Influenza Vaccine  09/05/2023   COVID-19 Vaccine (3 - 2025-26 season) 12/13/2023 (Originally 10/06/2023)   Medicare Annual Wellness (AWV)  11/26/2024   Pneumococcal Vaccine: 50+ Years  Completed   DEXA SCAN  Completed   Meningococcal B Vaccine  Aged Out   DTaP/Tdap/Td  Discontinued    Health Maintenance  Health Maintenance Due  Topic Date Due   Zoster Vaccines- Shingrix (2 of 2) 08/21/2021   Influenza Vaccine  09/05/2023    Colorectal cancer screening: No longer required.   Mammogram status: No longer required due to  .  Bone Density Will discuss with MD at next visit  Lung Cancer Screening: (Low Dose CT Chest recommended if Age 7-80 years, 20 pack-year currently smoking OR have quit w/in 15years.) does not qualify.   Lung Cancer Screening Referral:   Additional Screening:  Hepatitis C Screening: does not qualify  Vision Screening: Recommended annual ophthalmology exams for early detection of glaucoma and other disorders of the eye. Is the patient up to date with their annual eye exam?  Yes  Who is the provider or what is the name of the office in which the patient attends annual eye exams? [facility] If pt is not established with a provider, would they like to be referred to a provider to establish care? No .   Dental Screening: Recommended annual dental exams for proper oral hygiene    Community Resource Referral / Chronic Care Management: CRR required this visit?  No   CCM required this visit?  No     Plan:     I have personally reviewed and noted the following in the patient's chart:   Medical and social  history Use of alcohol, tobacco or illicit drugs  Current medications and supplements including opioid prescriptions. Patient is not currently taking opioid prescriptions. Functional ability and status Nutritional status Physical activity Advanced directives List of other physicians Hospitalizations, surgeries, and ER visits in previous 12 months Vitals Screenings to include cognitive, depression, and falls Referrals and appointments  In addition, I have reviewed and discussed with patient certain preventive protocols, quality metrics, and best practice recommendations. A written personalized care plan for preventive services as well as general preventive health recommendations were provided to patient.     Mliss Graff, LPN   [id number]   After Visit Summary: (MyChart) Due to this being a telephonic visit, the after visit summary with patients personalized plan was offered to patient via MyChart   Nurse Notes:

## 2023-11-27 NOTE — Patient Instructions (Signed)
 Brittany Levy , Thank you for taking time to come for your Medicare Wellness Visit. I appreciate your ongoing commitment to your health goals. Please review the following plan we discussed and let me know if I can assist you in the future.   Screening recommendations/referrals: Colonoscopy:  Mammogram:  Bone Density:  Recommended yearly ophthalmology/optometry visit for glaucoma screening and checkup Recommended yearly dental visit for hygiene and checkup  Vaccinations: Influenza vaccine:  Pneumococcal vaccine:  Tdap vaccine:  Shingles vaccine:           Preventive Care 65 Years and Older, Female Preventive care refers to lifestyle choices and visits with your health care provider that can promote health and wellness. What does preventive care include? A yearly physical exam. This is also called an annual well check. Dental exams once or twice a year. Routine eye exams. Ask your health care provider how often you should have your eyes checked. Personal lifestyle choices, including: Daily care of your teeth and gums. Regular physical activity. Eating a healthy diet. Avoiding tobacco and drug use. Limiting alcohol use. Practicing safe sex. Taking low-dose aspirin every day. Taking vitamin and mineral supplements as recommended by your health care provider. What happens during an annual well check? The services and screenings done by your health care provider during your annual well check will depend on your age, overall health, lifestyle risk factors, and family history of disease. Counseling  Your health care provider may ask you questions about your: Alcohol use. Tobacco use. Drug use. Emotional well-being. Home and relationship well-being. Sexual activity. Eating habits. History of falls. Memory and ability to understand (cognition). Work and work Astronomer. Reproductive health. Screening  You may have the following tests or measurements: Height, weight, and  BMI. Blood pressure. Lipid and cholesterol levels. These may be checked every 5 years, or more frequently if you are over 24 years old. Skin check. Lung cancer screening. You may have this screening every year starting at age 59 if you have a 30-pack-year history of smoking and currently smoke or have quit within the past 15 years. Fecal occult blood test (FOBT) of the stool. You may have this test every year starting at age 36. Flexible sigmoidoscopy or colonoscopy. You may have a sigmoidoscopy every 5 years or a colonoscopy every 10 years starting at age 74. Hepatitis C blood test. Hepatitis B blood test. Sexually transmitted disease (STD) testing. Diabetes screening. This is done by checking your blood sugar (glucose) after you have not eaten for a while (fasting). You may have this done every 1-3 years. Bone density scan. This is done to screen for osteoporosis. You may have this done starting at age 41. Mammogram. This may be done every 1-2 years. Talk to your health care provider about how often you should have regular mammograms. Talk with your health care provider about your test results, treatment options, and if necessary, the need for more tests. Vaccines  Your health care provider may recommend certain vaccines, such as: Influenza vaccine. This is recommended every year. Tetanus, diphtheria, and acellular pertussis (Tdap, Td) vaccine. You may need a Td booster every 10 years. Zoster vaccine. You may need this after age 13. Pneumococcal 13-valent conjugate (PCV13) vaccine. One dose is recommended after age 14. Pneumococcal polysaccharide (PPSV23) vaccine. One dose is recommended after age 97. Talk to your health care provider about which screenings and vaccines you need and how often you need them. This information is not intended to replace advice given to you  by your health care provider. Make sure you discuss any questions you have with your health care provider. Document  Released: 02/17/2015 Document Revised: 10/11/2015 Document Reviewed: 11/22/2014 Elsevier Interactive Patient Education  2017 [employer].  Fall Prevention in the Home Falls can cause injuries. They can happen to people of all ages. There are many things you can do to make your home safe and to help prevent falls. What can I do on the outside of my home? Regularly fix the edges of walkways and driveways and fix any cracks. Remove anything that might make you trip as you walk through a door, such as a raised step or threshold. Trim any bushes or trees on the path to your home. Use bright outdoor lighting. Clear any walking paths of anything that might make someone trip, such as rocks or tools. Regularly check to see if handrails are loose or broken. Make sure that both sides of any steps have handrails. Any raised decks and porches should have guardrails on the edges. Have any leaves, snow, or ice cleared regularly. Use sand or salt on walking paths during winter. Clean up any spills in your garage right away. This includes oil or grease spills. What can I do in the bathroom? Use night lights. Install grab bars by the toilet and in the tub and shower. Do not use towel bars as grab bars. Use non-skid mats or decals in the tub or shower. If you need to sit down in the shower, use a plastic, non-slip stool. Keep the floor dry. Clean up any water that spills on the floor as soon as it happens. Remove soap buildup in the tub or shower regularly. Attach bath mats securely with double-sided non-slip rug tape. Do not have throw rugs and other things on the floor that can make you trip. What can I do in the bedroom? Use night lights. Make sure that you have a light by your bed that is easy to reach. Do not use any sheets or blankets that are too big for your bed. They should not hang down onto the floor. Have a firm chair that has side arms. You can use this for support while you get dressed. Do  not have throw rugs and other things on the floor that can make you trip. What can I do in the kitchen? Clean up any spills right away. Avoid walking on wet floors. Keep items that you use a lot in easy-to-reach places. If you need to reach something above you, use a strong step stool that has a grab bar. Keep electrical cords out of the way. Do not use floor polish or wax that makes floors slippery. If you must use wax, use non-skid floor wax. Do not have throw rugs and other things on the floor that can make you trip. What can I do with my stairs? Do not leave any items on the stairs. Make sure that there are handrails on both sides of the stairs and use them. Fix handrails that are broken or loose. Make sure that handrails are as long as the stairways. Check any carpeting to make sure that it is firmly attached to the stairs. Fix any carpet that is loose or worn. Avoid having throw rugs at the top or bottom of the stairs. If you do have throw rugs, attach them to the floor with carpet tape. Make sure that you have a light switch at the top of the stairs and the bottom of the stairs. If  you do not have them, ask someone to add them for you. What else can I do to help prevent falls? Wear shoes that: Do not have high heels. Have rubber bottoms. Are comfortable and fit you well. Are closed at the toe. Do not wear sandals. If you use a stepladder: Make sure that it is fully opened. Do not climb a closed stepladder. Make sure that both sides of the stepladder are locked into place. Ask someone to hold it for you, if possible. Clearly mark and make sure that you can see: Any grab bars or handrails. First and last steps. Where the edge of each step is. Use tools that help you move around (mobility aids) if they are needed. These include: Canes. Walkers. Scooters. Crutches. Turn on the lights when you go into a dark area. Replace any light bulbs as soon as they burn out. Set up your  furniture so you have a clear path. Avoid moving your furniture around. If any of your floors are uneven, fix them. If there are any pets around you, be aware of where they are. Review your medicines with your doctor. Some medicines can make you feel dizzy. This can increase your chance of falling. Ask your doctor what other things that you can do to help prevent falls. This information is not intended to replace advice given to you by your health care provider. Make sure you discuss any questions you have with your health care provider. Document Released: 11/17/2008 Document Revised: 06/29/2015 Document Reviewed: 02/25/2014 Elsevier Interactive Patient Education  2017 [employer].

## 2023-12-24 ENCOUNTER — Other Ambulatory Visit: Payer: Self-pay

## 2023-12-24 MED ORDER — CLONAZEPAM 0.5 MG PO TABS
0.5000 mg | ORAL_TABLET | Freq: Two times a day (BID) | ORAL | 0 refills | Status: AC | PRN
  Filled 2024-02-21: qty 60, 30d supply, fill #0

## 2023-12-24 MED ORDER — OMEPRAZOLE 40 MG PO CPDR
40.0000 mg | DELAYED_RELEASE_CAPSULE | Freq: Two times a day (BID) | ORAL | 3 refills | Status: AC
  Filled 2024-01-30: qty 180, 90d supply, fill #0

## 2023-12-24 MED ORDER — GABAPENTIN 100 MG PO CAPS: 100.0000 mg | ORAL_CAPSULE | Freq: Three times a day (TID) | ORAL | 3 refills | Status: AC | PRN

## 2023-12-24 MED ORDER — FLUTICASONE PROPIONATE 50 MCG/ACT NA SUSP: 2.0000 | Freq: Every day | NASAL | 12 refills | Status: AC

## 2023-12-25 ENCOUNTER — Ambulatory Visit: Admitting: Cardiology

## 2023-12-26 ENCOUNTER — Other Ambulatory Visit: Payer: Self-pay

## 2023-12-26 ENCOUNTER — Other Ambulatory Visit: Payer: Self-pay | Admitting: Family Medicine

## 2023-12-26 DIAGNOSIS — I10 Essential (primary) hypertension: Secondary | ICD-10-CM

## 2023-12-26 DIAGNOSIS — I6523 Occlusion and stenosis of bilateral carotid arteries: Secondary | ICD-10-CM

## 2023-12-26 MED ORDER — ATORVASTATIN CALCIUM 40 MG PO TABS
40.0000 mg | ORAL_TABLET | Freq: Every day | ORAL | 3 refills | Status: AC
  Filled 2023-12-26: qty 90, 90d supply, fill #0

## 2023-12-26 MED ORDER — HYDROCHLOROTHIAZIDE 25 MG PO TABS
25.0000 mg | ORAL_TABLET | Freq: Every day | ORAL | 3 refills | Status: AC
  Filled 2023-12-26 – 2024-01-05 (×2): qty 90, 90d supply, fill #0

## 2023-12-26 MED ORDER — HYDRALAZINE HCL 25 MG PO TABS
50.0000 mg | ORAL_TABLET | Freq: Two times a day (BID) | ORAL | 0 refills | Status: AC
  Filled 2023-12-26 – 2024-01-05 (×2): qty 360, 90d supply, fill #0

## 2023-12-26 MED ORDER — CARVEDILOL 6.25 MG PO TABS
6.2500 mg | ORAL_TABLET | Freq: Two times a day (BID) | ORAL | 1 refills | Status: AC
  Filled 2023-12-26 – 2024-02-21 (×2): qty 180, 90d supply, fill #0

## 2023-12-26 NOTE — Telephone Encounter (Signed)
 Copied from CRM #[id number]. Topic: Clinical - Medication Question >> Dec 26, 2023 10:13 AM Gustabo D wrote: Pt wants all her medications to got to [facility]

## 2023-12-29 ENCOUNTER — Other Ambulatory Visit: Payer: Self-pay

## 2024-01-05 ENCOUNTER — Other Ambulatory Visit: Payer: Self-pay | Admitting: Family Medicine

## 2024-01-05 ENCOUNTER — Other Ambulatory Visit: Payer: Self-pay

## 2024-01-06 ENCOUNTER — Other Ambulatory Visit: Payer: Self-pay

## 2024-01-07 ENCOUNTER — Other Ambulatory Visit: Payer: Self-pay

## 2024-01-07 ENCOUNTER — Other Ambulatory Visit: Payer: Self-pay | Admitting: Family Medicine

## 2024-01-09 ENCOUNTER — Other Ambulatory Visit: Payer: Self-pay

## 2024-01-09 NOTE — Telephone Encounter (Signed)
 Prescription Request  01/09/2024  LOV:08/25/23  What is the name of the medication or equipment? potassium chloride  (KLOR-CON ) 10 MEQ tablet [[id number]]   Have you contacted your pharmacy to request a refill? Yes   Which pharmacy would you like this sent to?   [facility] [address] [place] [address] Phone: [phone]  Fax: [phone] DEA #: [id number]    Patient notified that their request is being sent to the clinical staff for review and that they should receive a response within 2 business days.   Please advise at [facility] [phone]

## 2024-01-10 ENCOUNTER — Other Ambulatory Visit: Payer: Self-pay | Admitting: Family Medicine

## 2024-01-10 ENCOUNTER — Other Ambulatory Visit: Payer: Self-pay

## 2024-01-12 ENCOUNTER — Other Ambulatory Visit: Payer: Self-pay | Admitting: Family Medicine

## 2024-01-12 ENCOUNTER — Other Ambulatory Visit: Payer: Self-pay

## 2024-01-12 MED ORDER — POTASSIUM CHLORIDE ER 10 MEQ PO TBCR
10.0000 meq | EXTENDED_RELEASE_TABLET | Freq: Every day | ORAL | 1 refills | Status: AC
  Filled 2024-01-12: qty 90, 90d supply, fill #0

## 2024-01-12 NOTE — Telephone Encounter (Signed)
 Requested Prescriptions  Pending Prescriptions Disp Refills   potassium chloride  (KLOR-CON ) 10 MEQ tablet 90 tablet 1    Sig: Take 1 tablet (10 mEq total) by mouth once for 1 dose.     Endocrinology:  Minerals - Potassium Supplementation Passed - 01/12/2024  4:04 PM      Passed - K in normal range and within 360 days    Potassium  Date Value Ref Range Status  07/25/2023 4.2 3.5 - 5.3 mmol/L Final         Passed - Cr in normal range and within 360 days    Creat  Date Value Ref Range Status  07/25/2023 0.82 0.60 - 0.95 mg/dL Final         Passed - Valid encounter within last 12 months    Recent Outpatient Visits           4 months ago Primary insomnia   [facility] Duanne, Butler DASEN, MD   6 months ago Prediabetes   [facility] [facility] Duanne, Butler DASEN, MD   9 months ago Carpal tunnel syndrome of right wrist   [facility] [facility] Duanne Butler DASEN, MD   1 year ago Primary hypertension   [place] [facility] Duanne Butler DASEN, MD   1 year ago Allergic contact dermatitis due to drugs in contact with skin   [place] Pickard, Butler DASEN, MD

## 2024-01-12 NOTE — Telephone Encounter (Signed)
 Copied from CRM [id number]. Topic: Clinical - Prescription Issue >> Jan 12, 2024 10:04 AM Tinnie BROCKS wrote: Reason for CRM: FYI- pt calling for update to refill request on potassium chloride  from [facility]. Pt states she took her last pill today and is now out. Requesting call at [phone]

## 2024-01-13 ENCOUNTER — Other Ambulatory Visit: Payer: Self-pay

## 2024-01-16 ENCOUNTER — Ambulatory Visit: Admitting: Family Medicine

## 2024-01-16 ENCOUNTER — Encounter: Payer: Self-pay | Admitting: Family Medicine

## 2024-01-16 ENCOUNTER — Other Ambulatory Visit: Payer: Self-pay

## 2024-01-16 VITALS — BP 132/60 | HR 73 | Temp 98.0°F | Ht 61.0 in | Wt 105.2 lb

## 2024-01-16 DIAGNOSIS — R7303 Prediabetes: Secondary | ICD-10-CM

## 2024-01-16 DIAGNOSIS — I10 Essential (primary) hypertension: Secondary | ICD-10-CM

## 2024-01-16 DIAGNOSIS — I6523 Occlusion and stenosis of bilateral carotid arteries: Secondary | ICD-10-CM

## 2024-01-16 DIAGNOSIS — Z1231 Encounter for screening mammogram for malignant neoplasm of breast: Secondary | ICD-10-CM

## 2024-01-16 DIAGNOSIS — M8580 Other specified disorders of bone density and structure, unspecified site: Secondary | ICD-10-CM

## 2024-01-16 DIAGNOSIS — Z23 Encounter for immunization: Secondary | ICD-10-CM

## 2024-01-16 MED ORDER — FELODIPINE ER 5 MG PO TB24
5.0000 mg | ORAL_TABLET | Freq: Every day | ORAL | 3 refills | Status: AC
  Filled 2024-01-16: qty 90, 90d supply, fill #0

## 2024-01-16 MED ORDER — IRBESARTAN 300 MG PO TABS
300.0000 mg | ORAL_TABLET | Freq: Every day | ORAL | 3 refills | Status: AC
  Filled 2024-01-16: qty 90, 90d supply, fill #0

## 2024-01-16 NOTE — Progress Notes (Signed)
 Subjective:    Patient ID: Brittany Levy, female    DOB: 08/23/1934, 89 y.o.   MRN: [id number]  HPI Patient is a very pleasant 89 year old Caucasian female with a longstanding history of hypertension who presents today for CPE.  Patient has now moved to an assisted living situation at [facility].  She seems to be doing much better there.  She has recently strained her back when she was trying to move from her home however this is up pain in the lower left flank and it is slowly getting better.  Her immunizations are up-to-date except for the second shingles vaccine, the COVID shot, and RSV shot.  Due to her age she does not require a Pap smear or colonoscopy.  She is due for a mammogram.  Her last bone density test was more than 3 years ago and showed borderline osteoporosis in the hip.  She is due to recheck this.  She denies any falls.  She denies any memory loss.  I performed carotid Dopplers last year to monitor her known carotid artery stenosis.  This has been stable for quite some time.  On exam today her bruit does not sound any louder than before.  Last year had carotid dopplers that revealed: IMPRESSION: 1. Moderate (50-69%) stenosis proximal right internal carotid artery secondary to heterogenous atherosclerotic plaque. 2. Moderate (50-69%) stenosis proximal left internal carotid artery secondary to heterogenous atherosclerotic plaque. 3. Vertebral arteries are patent with normal antegrade flow.    Past Medical History:  Diagnosis Date   Bilateral carotid artery stenosis    Chest pain    GERD (gastroesophageal reflux disease)    Hypertension    Insomnia    Osteoporosis of femur without pathological fracture    Past Surgical History:  Procedure Laterality Date   ABDOMINAL HYSTERECTOMY     BREAST BIOPSY Right 05/07/2016   FRAGMENTS OF LYMPH NODE   Current Outpatient Medications on File Prior to Visit  Medication Sig Dispense Refill   aspirin 81 MG tablet Take 81 mg by  mouth daily.     atorvastatin  (LIPITOR) 40 MG tablet Take 1 tablet (40 mg total) by mouth daily. 90 tablet 3   calcium -vitamin D (OSCAL WITH D) 250-125 MG-UNIT per tablet Take 1 tablet by mouth 2 (two) times daily. (Patient not taking: Reported on 11/27/2023)     carvedilol  (COREG ) 6.25 MG tablet Take 1 tablet (6.25 mg total) by mouth 2 (two) times daily with a meal. 180 tablet 1   clonazePAM  (KLONOPIN ) 0.5 MG tablet TAKE ONE TABLET BY MOUTH TWICE A DAY AS NEEDED FOR ANXIETY 60 tablet 2   clonazePAM  (KLONOPIN ) 0.5 MG tablet Take 1 tablet (0.5 mg total) by mouth 2 (two) times daily as needed for anxiety. 60 tablet 0   felodipine  (PLENDIL ) 5 MG 24 hr tablet TAKE ONE TABLET (5 MG TOTAL) BY MOUTH DAILY. 90 tablet 0   fluticasone  (FLONASE ) 50 MCG/ACT nasal spray Place 2 sprays into both nostrils daily. 16 g 12   gabapentin  (NEURONTIN ) 100 MG capsule Take 1 capsule (100 mg total) by mouth 3 (three) times daily as needed. 90 capsule 3   gabapentin  (NEURONTIN ) 100 MG capsule Take 1 capsule (100 mg total) by mouth 3 (three) times daily as needed. 90 capsule 3   hydrALAZINE  (APRESOLINE ) 25 MG tablet Take 2 tablets (50 mg total) by mouth in the morning and at bedtime. 360 tablet 0   hydrochlorothiazide  (HYDRODIURIL ) 25 MG tablet Take 1 tablet (25 mg total)  by mouth daily. 90 tablet 3   irbesartan  (AVAPRO ) 300 MG tablet TAKE ONE TABLET (300 MG TOTAL) BY MOUTH DAILY. 90 tablet 0   Omega-3 Fatty Acids (FISH OIL) 1000 MG CAPS Take 1,000 mg by mouth daily.     omeprazole  (PRILOSEC) 40 MG capsule Take 1 capsule (40 mg total) by mouth in the morning and at bedtime. (Patient not taking: Reported on 11/27/2023) 180 capsule 3   omeprazole  (PRILOSEC) 40 MG capsule Take 1 capsule (40 mg total) by mouth 2 (two) times daily. 180 capsule 3   potassium chloride  (KLOR-CON ) 10 MEQ tablet Take 1 tablet (10 mEq total) by mouth daily. 90 tablet 1   Current Facility-Administered Medications on File Prior to Visit  Medication Dose  Route Frequency Provider Last Rate Last Admin   denosumab  (PROLIA ) injection 60 mg  60 mg Subcutaneous Once Duanne Butler DASEN, MD         No Known Allergies  Social History   Socioeconomic History   Marital status: Widowed    Spouse name: Not on file   Number of children: 1   Years of education: Not on file   Highest education level: Not on file  Occupational History   Not on file  Tobacco Use   Smoking status: Never   Smokeless tobacco: Never  Substance and Sexual Activity   Alcohol use: Yes    Comment: wine occasionally   Drug use: No   Sexual activity: Not Currently  Other Topics Concern   Not on file  Social History Narrative   Husband passed in 2021.   1 daughter.   2 grand children.   Social Drivers of Health   Tobacco Use: Low Risk (11/27/2023)   Patient History    Smoking Tobacco Use: Never    Smokeless Tobacco Use: Never    Passive Exposure: Not on file  Financial Resource Strain: Low Risk (11/27/2023)   Overall Financial Resource Strain (CARDIA)    Difficulty of Paying Living Expenses: Not hard at all  Food Insecurity: No Food Insecurity (11/27/2023)   Epic    Worried About Programme Researcher, Broadcasting/film/video in the Last Year: Never true    Ran Out of Food in the Last Year: Never true  Transportation Needs: No Transportation Needs (11/27/2023)   Epic    Lack of Transportation (Medical): No    Lack of Transportation (Non-Medical): No  Physical Activity: Inactive (11/27/2023)   Exercise Vital Sign    Days of Exercise per Week: 0 days    Minutes of Exercise per Session: 0 min  Stress: Stress Concern Present (11/27/2023)   [employer] of Occupational Health - Occupational Stress Questionnaire    Feeling of Stress: To some extent  Social Connections: Socially Integrated (11/27/2023)   Social Connection and Isolation Panel    Frequency of Communication with Friends and Family: More than three times a week    Frequency of Social Gatherings with Friends and Family:  More than three times a week    Attends Religious Services: More than 4 times per year    Active Member of Clubs or Organizations: Yes    Attends Banker Meetings: More than 4 times per year    Marital Status: Married  Catering Manager Violence: Not At Risk (11/27/2023)   Epic    Fear of Current or Ex-Partner: No    Emotionally Abused: No    Physically Abused: No    Sexually Abused: No  Depression (PHQ2-9): Low Risk (11/27/2023)  Depression (PHQ2-9)    PHQ-2 Score: 0  Alcohol Screen: Low Risk (11/27/2023)   Alcohol Screen    Last Alcohol Screening Score (AUDIT): 0  Housing: Unknown (11/27/2023)   Epic    Unable to Pay for Housing in the Last Year: No    Number of Times Moved in the Last Year: Not on file    Homeless in the Last Year: No  Utilities: Not At Risk (11/27/2023)   Epic    Threatened with loss of utilities: No  Health Literacy: Adequate Health Literacy (11/27/2023)   B1300 Health Literacy    Frequency of need for help with medical instructions: Never     Review of Systems     Objective:   Physical Exam Constitutional:      General: She is not in acute distress.    Appearance: Normal appearance. She is normal weight. She is not ill-appearing or toxic-appearing.  HENT:     Right Ear: Tympanic membrane and ear canal normal.     Left Ear: Tympanic membrane and ear canal normal.     Nose: No congestion or rhinorrhea.     Mouth/Throat:     Pharynx: No oropharyngeal exudate or posterior oropharyngeal erythema.  Eyes:     Extraocular Movements: Extraocular movements intact.     Conjunctiva/sclera: Conjunctivae normal.     Pupils: Pupils are equal, round, and reactive to light.  Neck:     Vascular: Carotid bruit present.  Cardiovascular:     Rate and Rhythm: Normal rate and regular rhythm.     Pulses: Normal pulses.     Heart sounds: Normal heart sounds. No murmur heard.    No friction rub.  Pulmonary:     Effort: Pulmonary effort is normal. No  respiratory distress.     Breath sounds: Normal breath sounds. No stridor. No wheezing, rhonchi or rales.  Abdominal:     General: Bowel sounds are normal. There is no distension.     Palpations: Abdomen is soft.     Tenderness: There is no abdominal tenderness. There is no guarding or rebound.  Musculoskeletal:        General: No swelling, tenderness, deformity or signs of injury.     Right lower leg: Normal. No swelling, deformity, tenderness or bony tenderness. No edema.     Left lower leg: Normal. No swelling, deformity, tenderness or bony tenderness. No edema.  Lymphadenopathy:     Cervical: No cervical adenopathy.  Skin:    Findings: No bruising, erythema, lesion or rash.  Neurological:     General: No focal deficit present.     Mental Status: She is alert and oriented to person, place, and time. Mental status is at baseline.     Cranial Nerves: No cranial nerve deficit.     Sensory: No sensory deficit.     Motor: No weakness.     Coordination: Coordination normal.     Gait: Gait normal.     Deep Tendon Reflexes: Reflexes normal.          Assessment & Plan:  Encounter for screening mammogram for malignant neoplasm of breast - Plan: MM 3D SCREENING MAMMOGRAM BILATERAL BREAST  Osteopenia, unspecified location - Plan: DG Bone Density, CBC with Differential/Platelet, Comprehensive metabolic panel with GFR, Hemoglobin A1c, Lipid panel, TSH  Prediabetes - Plan: CBC with Differential/Platelet, Comprehensive metabolic panel with GFR, Hemoglobin A1c, Lipid panel, TSH  Primary hypertension - Plan: CBC with Differential/Platelet, Comprehensive metabolic panel with GFR, Hemoglobin A1c, Lipid panel, TSH  Bilateral carotid artery stenosis Physical exam today is normal.  Given her carotid bruit I would like to check a fasting lipid panel.  I want to keep her LDL cholesterol less than 70.  Blood pressure today is well-controlled.  She has a history of prediabetes so we will monitor a  hemoglobin A1c.  Given her age and also going to check a TSH to evaluate for hypothyroidism.  I will schedule the patient for a mammogram.  Despite being 89 she is excellent physical shape and I believe a mammogram is still warranted.  I will also schedule the patient for a bone density and if her bone density has deteriorated further I will start the patient on Fosamax.  She received the second shingles vaccine today.  I recommended a COVID shot as well as an RSV vaccine

## 2024-01-16 NOTE — Addendum Note (Signed)
 Addended by: ANGELENA RONAL BRADLEY K on: 01/16/2024 11:31 AM   Modules accepted: Orders

## 2024-01-19 ENCOUNTER — Ambulatory Visit: Payer: Self-pay | Admitting: Family Medicine

## 2024-01-19 LAB — CBC WITH DIFFERENTIAL/PLATELET
Absolute Lymphocytes: 2434 {cells}/uL (ref 850–3900)
Absolute Monocytes: 778 {cells}/uL (ref 200–950)
Basophils Absolute: 50 {cells}/uL (ref 0–200)
Basophils Relative: 0.7 %
Eosinophils Absolute: 151 {cells}/uL (ref 15–500)
Eosinophils Relative: 2.1 %
HCT: 37.3 % (ref 35.9–46.0)
Hemoglobin: 12.1 g/dL (ref 11.7–15.5)
MCH: 29.9 pg (ref 27.0–33.0)
MCHC: 32.4 g/dL (ref 31.6–35.4)
MCV: 92.1 fL (ref 81.4–101.7)
MPV: 11.1 fL (ref 7.5–12.5)
Monocytes Relative: 10.8 %
Neutro Abs: 3787 {cells}/uL (ref 1500–7800)
Neutrophils Relative %: 52.6 %
Platelets: 179 Thousand/uL (ref 140–400)
RBC: 4.05 Million/uL (ref 3.80–5.10)
RDW: 12.5 % (ref 11.0–15.0)
Total Lymphocyte: 33.8 %
WBC: 7.2 Thousand/uL (ref 3.8–10.8)

## 2024-01-19 LAB — LIPID PANEL
Cholesterol: 135 mg/dL (ref <200)
HDL: 86 mg/dL (ref >=50)
LDL Cholesterol (Calc): 36 mg/dL
Non-HDL Cholesterol (Calc): 49 mg/dL (ref <130)
Total CHOL/HDL Ratio: 1.6 (calc) (ref <5.0)
Triglycerides: 53 mg/dL (ref <150)

## 2024-01-19 LAB — COMPREHENSIVE METABOLIC PANEL WITH GFR
AG Ratio: 1.8 (calc) (ref 1.0–2.5)
ALT: 20 U/L (ref 6–29)
AST: 20 U/L (ref 10–35)
Albumin: 4.3 g/dL (ref 3.6–5.1)
Alkaline phosphatase (APISO): 83 U/L (ref 37–153)
BUN: 15 mg/dL (ref 7–25)
CO2: 34 mmol/L — ABNORMAL HIGH (ref 20–32)
Calcium: 10.3 mg/dL (ref 8.6–10.4)
Chloride: 96 mmol/L — ABNORMAL LOW (ref 98–110)
Creat: 0.69 mg/dL (ref 0.60–0.95)
Globulin: 2.4 g/dL (ref 1.9–3.7)
Glucose, Bld: 93 mg/dL (ref 65–99)
Potassium: 3.7 mmol/L (ref 3.5–5.3)
Sodium: 137 mmol/L (ref 135–146)
Total Bilirubin: 0.7 mg/dL (ref 0.2–1.2)
Total Protein: 6.7 g/dL (ref 6.1–8.1)
eGFR: 84 mL/min/1.73m2 (ref >=60)

## 2024-01-19 LAB — TSH: TSH: 2.67 m[IU]/L (ref 0.40–4.50)

## 2024-01-19 LAB — HEMOGLOBIN A1C
Hgb A1c MFr Bld: 6.2 % — ABNORMAL HIGH (ref <5.7)
Mean Plasma Glucose: 131 mg/dL
eAG (mmol/L): 7.3 mmol/L

## 2024-01-30 ENCOUNTER — Other Ambulatory Visit: Payer: Self-pay

## 2024-02-21 ENCOUNTER — Other Ambulatory Visit: Payer: Self-pay

## 2024-02-22 ENCOUNTER — Other Ambulatory Visit: Payer: Self-pay

## 2024-02-24 ENCOUNTER — Other Ambulatory Visit: Payer: Self-pay | Admitting: Family Medicine

## 2024-02-24 ENCOUNTER — Other Ambulatory Visit: Payer: Self-pay

## 2024-02-24 MED ORDER — CLONAZEPAM 0.5 MG PO TABS
0.5000 mg | ORAL_TABLET | Freq: Two times a day (BID) | ORAL | 0 refills | Status: AC | PRN
  Filled 2024-02-24: qty 60, 30d supply, fill #0

## 2024-12-02 ENCOUNTER — Ambulatory Visit
